# Patient Record
Sex: Female | Born: 1940
Health system: Southern US, Community
[De-identification: ages and names within clinical notes are randomized; demographics above are authoritative.]

## PROBLEM LIST (undated history)

## (undated) DIAGNOSIS — N3941 Urge incontinence: Secondary | ICD-10-CM

## (undated) DIAGNOSIS — E079 Disorder of thyroid, unspecified: Secondary | ICD-10-CM

## (undated) DIAGNOSIS — E039 Hypothyroidism, unspecified: Secondary | ICD-10-CM

## (undated) DIAGNOSIS — Z800 Family history of malignant neoplasm of digestive organs: Secondary | ICD-10-CM

## (undated) DIAGNOSIS — C801 Malignant (primary) neoplasm, unspecified: Secondary | ICD-10-CM

## (undated) DIAGNOSIS — Z8041 Family history of malignant neoplasm of ovary: Secondary | ICD-10-CM

## (undated) DIAGNOSIS — M316 Other giant cell arteritis: Secondary | ICD-10-CM

## (undated) HISTORY — DX: Family history of malignant neoplasm of ovary: Z80.41

## (undated) HISTORY — PX: ABDOMINAL HYSTERECTOMY: SHX81

## (undated) HISTORY — PX: APPENDECTOMY: SHX54

## (undated) HISTORY — DX: Family history of malignant neoplasm of digestive organs: Z80.0

---

## 1999-09-22 ENCOUNTER — Other Ambulatory Visit: Admission: RE | Admit: 1999-09-22 | Discharge: 1999-09-22 | Payer: Self-pay | Admitting: *Deleted

## 2001-02-26 ENCOUNTER — Other Ambulatory Visit: Admission: RE | Admit: 2001-02-26 | Discharge: 2001-02-26 | Payer: Self-pay | Admitting: *Deleted

## 2001-04-09 ENCOUNTER — Ambulatory Visit (HOSPITAL_COMMUNITY): Admission: RE | Admit: 2001-04-09 | Discharge: 2001-04-09 | Payer: Self-pay | Admitting: *Deleted

## 2001-04-17 ENCOUNTER — Encounter: Payer: Self-pay | Admitting: *Deleted

## 2001-04-17 ENCOUNTER — Ambulatory Visit (HOSPITAL_COMMUNITY): Admission: RE | Admit: 2001-04-17 | Discharge: 2001-04-17 | Payer: Self-pay | Admitting: *Deleted

## 2001-06-04 ENCOUNTER — Observation Stay (HOSPITAL_COMMUNITY): Admission: RE | Admit: 2001-06-04 | Discharge: 2001-06-05 | Payer: Self-pay | Admitting: *Deleted

## 2001-10-02 ENCOUNTER — Other Ambulatory Visit: Admission: RE | Admit: 2001-10-02 | Discharge: 2001-10-02 | Payer: Self-pay | Admitting: *Deleted

## 2002-01-31 ENCOUNTER — Other Ambulatory Visit: Admission: RE | Admit: 2002-01-31 | Discharge: 2002-01-31 | Payer: Self-pay | Admitting: *Deleted

## 2002-05-06 ENCOUNTER — Other Ambulatory Visit: Admission: RE | Admit: 2002-05-06 | Discharge: 2002-05-06 | Payer: Self-pay | Admitting: *Deleted

## 2002-09-03 ENCOUNTER — Other Ambulatory Visit: Admission: RE | Admit: 2002-09-03 | Discharge: 2002-09-03 | Payer: Self-pay | Admitting: *Deleted

## 2003-01-03 ENCOUNTER — Other Ambulatory Visit: Admission: RE | Admit: 2003-01-03 | Discharge: 2003-01-03 | Payer: Self-pay | Admitting: *Deleted

## 2003-06-05 ENCOUNTER — Other Ambulatory Visit: Admission: RE | Admit: 2003-06-05 | Discharge: 2003-06-05 | Payer: Self-pay | Admitting: *Deleted

## 2009-04-09 DIAGNOSIS — N3941 Urge incontinence: Secondary | ICD-10-CM | POA: Insufficient documentation

## 2011-07-14 DIAGNOSIS — E7800 Pure hypercholesterolemia, unspecified: Secondary | ICD-10-CM | POA: Insufficient documentation

## 2014-10-23 DIAGNOSIS — N905 Atrophy of vulva: Secondary | ICD-10-CM | POA: Insufficient documentation

## 2015-09-24 DIAGNOSIS — Z6837 Body mass index (BMI) 37.0-37.9, adult: Secondary | ICD-10-CM | POA: Diagnosis not present

## 2015-09-24 DIAGNOSIS — J019 Acute sinusitis, unspecified: Secondary | ICD-10-CM | POA: Diagnosis not present

## 2015-09-24 DIAGNOSIS — H6691 Otitis media, unspecified, right ear: Secondary | ICD-10-CM | POA: Diagnosis not present

## 2015-10-01 ENCOUNTER — Emergency Department (HOSPITAL_COMMUNITY)
Admission: EM | Admit: 2015-10-01 | Discharge: 2015-10-01 | Disposition: A | Discharge status: Home / Self Care | Payer: Medicare Other | Attending: Emergency Medicine | Admitting: Emergency Medicine

## 2015-10-01 ENCOUNTER — Emergency Department (HOSPITAL_COMMUNITY): Payer: Medicare Other

## 2015-10-01 ENCOUNTER — Encounter (HOSPITAL_COMMUNITY): Payer: Self-pay | Admitting: Emergency Medicine

## 2015-10-01 DIAGNOSIS — R51 Headache: Secondary | ICD-10-CM | POA: Diagnosis not present

## 2015-10-01 DIAGNOSIS — R6884 Jaw pain: Secondary | ICD-10-CM | POA: Diagnosis not present

## 2015-10-01 DIAGNOSIS — M316 Other giant cell arteritis: Secondary | ICD-10-CM | POA: Diagnosis not present

## 2015-10-01 DIAGNOSIS — Z7982 Long term (current) use of aspirin: Secondary | ICD-10-CM | POA: Diagnosis not present

## 2015-10-01 HISTORY — DX: Disorder of thyroid, unspecified: E07.9

## 2015-10-01 LAB — CBC WITH DIFFERENTIAL/PLATELET
Basophils Absolute: 0 10*3/uL (ref 0.0–0.1)
Basophils Relative: 0 %
Eosinophils Absolute: 0.1 10*3/uL (ref 0.0–0.7)
Eosinophils Relative: 1 %
HCT: 35.8 % — ABNORMAL LOW (ref 36.0–46.0)
HEMOGLOBIN: 11.5 g/dL — AB (ref 12.0–15.0)
LYMPHS ABS: 1.1 10*3/uL (ref 0.7–4.0)
LYMPHS PCT: 11 %
MCH: 27.8 pg (ref 26.0–34.0)
MCHC: 32.1 g/dL (ref 30.0–36.0)
MCV: 86.7 fL (ref 78.0–100.0)
Monocytes Absolute: 0.9 10*3/uL (ref 0.1–1.0)
Monocytes Relative: 10 %
NEUTROS PCT: 78 %
Neutro Abs: 7.6 10*3/uL (ref 1.7–7.7)
Platelets: 294 10*3/uL (ref 150–400)
RBC: 4.13 MIL/uL (ref 3.87–5.11)
RDW: 14.6 % (ref 11.5–15.5)
WBC: 9.8 10*3/uL (ref 4.0–10.5)

## 2015-10-01 LAB — SEDIMENTATION RATE: Sed Rate: 90 mm/hr — ABNORMAL HIGH (ref 0–22)

## 2015-10-01 LAB — BASIC METABOLIC PANEL
Anion gap: 10 (ref 5–15)
BUN: 13 mg/dL (ref 6–20)
CHLORIDE: 108 mmol/L (ref 101–111)
CO2: 22 mmol/L (ref 22–32)
Calcium: 9.2 mg/dL (ref 8.9–10.3)
Creatinine, Ser: 1.07 mg/dL — ABNORMAL HIGH (ref 0.44–1.00)
GFR calc Af Amer: 59 mL/min — ABNORMAL LOW (ref >60)
GFR calc non Af Amer: 51 mL/min — ABNORMAL LOW (ref >60)
GLUCOSE: 118 mg/dL — AB (ref 65–99)
POTASSIUM: 3.6 mmol/L (ref 3.5–5.1)
Sodium: 140 mmol/L (ref 135–145)

## 2015-10-01 LAB — C-REACTIVE PROTEIN: CRP: 18.6 mg/dL — ABNORMAL HIGH (ref <1.0)

## 2015-10-01 MED ORDER — ASPIRIN 81 MG PO CHEW: 81.0000 mg | CHEWABLE_TABLET | Freq: Every day | ORAL | 0 refills | Status: DC

## 2015-10-01 MED ORDER — BUTALBITAL-APAP-CAFFEINE 50-325-40 MG PO TABS
1.0000 | ORAL_TABLET | Freq: Once | ORAL | Status: AC
Start: 2015-10-01 — End: 2015-10-01
  Administered 2015-10-01: 1 via ORAL
  Filled 2015-10-01: qty 1

## 2015-10-01 MED ORDER — IBUPROFEN 800 MG PO TABS
800.0000 mg | ORAL_TABLET | Freq: Once | ORAL | Status: AC
  Administered 2015-10-01: 800 mg via ORAL
  Filled 2015-10-01: qty 1

## 2015-10-01 MED ORDER — PREDNISONE 20 MG PO TABS: ORAL_TABLET | ORAL | 0 refills | Status: DC

## 2015-10-01 MED ORDER — ACETAMINOPHEN 325 MG PO TABS
650.0000 mg | ORAL_TABLET | Freq: Once | ORAL | Status: AC
  Administered 2015-10-01: 650 mg via ORAL
  Filled 2015-10-01: qty 2

## 2015-10-01 NOTE — ED Notes (Signed)
Pt given a snack per Dr. Tyrone Nine.

## 2015-10-01 NOTE — ED Notes (Deleted)
Attempted to start an IV on pt.

## 2015-10-01 NOTE — ED Triage Notes (Signed)
Pt states one week ago she was having right ear and head pain and seen pcp and was given antibiotics and still no better. Seen dentist Monday and was told she had a malocclusion of her right side of her jaw. Pt still having right side head pain and down into ear and jaw.

## 2015-10-01 NOTE — ED Provider Notes (Signed)
[facility] DEPT Provider Note   CSN: [id number] Arrival date & time: 10/01/15  1126     History   Chief Complaint Chief Complaint  Patient presents with  . Headache  . Otalgia    HPI Kim Krueger is a 75 y.o. female.  75 yo F with a chief complaint of right temporal pain. This been going on for the past couple weeks. She has seen her family physician as well as her dentist for this. She was started on Levaquin for a possible otitis media. She was given a mouth guard for likely TMJ syndrome. She is unable to tolerate the mouthguard because it irritates her gums. She denies fevers or chills denies vomiting or diarrhea. Patient initially denies neurologic deficits but said she had a transient right lower and left lower visual field defect which has completely resolved. She denied any weakness or difficulty with speech. Denies any head injuries. Pain comes and goes this typically dull but has some sharp shooting character to it.   The history is provided by the patient.  Headache   This is a new problem. The current episode started less than 1 hour ago. The problem occurs constantly. The problem has not changed since onset.The headache is associated with nothing. The quality of the pain is described as sharp. The pain is at a severity of 9/10. The pain is moderate. The pain radiates to the face. Pertinent negatives include no fever, no palpitations, no shortness of breath, no nausea and no vomiting. She has tried nothing for the symptoms. The treatment provided no relief.  Otalgia  Pertinent negatives include no headaches, no rhinorrhea and no vomiting.    Past Medical History:  Diagnosis Date  . Thyroid disease     There are no active problems to display for this patient.   History reviewed. No pertinent surgical history.  OB History    No data available       Home Medications    Prior to Admission medications   Medication Sig Start Date End Date Taking?  Authorizing Provider  fluticasone (FLONASE) 50 MCG/ACT nasal spray Place 1 spray into both nostrils daily as needed. 09/24/15  Yes Historical Provider, MD  ibuprofen (ADVIL,MOTRIN) 200 MG tablet Take 600-800 mg by mouth every 4 (four) hours as needed for moderate pain.   Yes Historical Provider, MD  levofloxacin (LEVAQUIN) 500 MG tablet Take 500 mg by mouth daily. 09/24/15  Yes Historical Provider, MD  levothyroxine (SYNTHROID, LEVOTHROID) 88 MCG tablet Take 88 mcg by mouth daily. 09/23/15  Yes Historical Provider, MD  aspirin 81 MG chewable tablet Chew 1 tablet (81 mg total) by mouth daily. 10/01/15 10/31/15  Deno Etienne, DO  predniSONE (DELTASONE) 20 MG tablet 32m a day. 10/01/15 10/15/15  DDeno Etienne DO    Family History No family history on file.  Social History Social History  Substance Use Topics  . Smoking status: Never Smoker  . Smokeless tobacco: Never Used  . Alcohol use No     Allergies   Ceftin [cefuroxime axetil]; Sulfa antibiotics; and Penicillins   Review of Systems Review of Systems  Constitutional: Negative for chills and fever.  HENT: Positive for ear pain. Negative for congestion and rhinorrhea.   Eyes: Negative for redness and visual disturbance.  Respiratory: Negative for shortness of breath and wheezing.   Cardiovascular: Negative for chest pain and palpitations.  Gastrointestinal: Negative for nausea and vomiting.  Genitourinary: Negative for dysuria and urgency.  Musculoskeletal: Negative for arthralgias and myalgias.  Skin: Negative for pallor and wound.  Neurological: Negative for dizziness and headaches.     Physical Exam Updated Vital Signs BP 123/66   Pulse 81   Temp 98.4 F (36.9 C) (Oral)   Resp 16   Ht _0  (1.6 m)   Wt 200 lb (90.7 kg)   SpO2 99%   BMI 35.43 kg/m   Physical Exam  Constitutional: She is oriented to person, place, and time. She appears well-developed and well-nourished. No distress.  HENT:  Head: Normocephalic and  atraumatic.  Tender to palpation about the right temporal artery. Right TM is normal. No pain at the mastoid process. No erythema. No issue with the ear canal. Patient's teeth appear without caries there is no tenderness without palpation or percussion of the teeth. She is tolerating her secretions without difficulty. Able to range her neck. No noted pain with clenching of her teeth.  Eyes: EOM are normal. Pupils are equal, round, and reactive to light.  Neck: Normal range of motion. Neck supple.  Cardiovascular: Normal rate and regular rhythm.  Exam reveals no gallop and no friction rub.   No murmur heard. Pulmonary/Chest: Effort normal. She has no wheezes. She has no rales.  Abdominal: Soft. She exhibits no distension. There is no tenderness.  Musculoskeletal: She exhibits no edema or tenderness.  Neurological: She is alert and oriented to person, place, and time. She has normal strength. No cranial nerve deficit or sensory deficit. She displays a negative Romberg sign. Coordination and gait normal. GCS eye subscore is 4. GCS verbal subscore is 5. GCS motor subscore is 6.  Reflex Scores:      Tricep reflexes are 2+ on the right side and 2+ on the left side.      Bicep reflexes are 2+ on the right side and 2+ on the left side.      Brachioradialis reflexes are 2+ on the right side and 2+ on the left side.      Patellar reflexes are 2+ on the right side and 2+ on the left side.      Achilles reflexes are 2+ on the right side and 2+ on the left side. Skin: Skin is warm and dry. She is not diaphoretic.  Psychiatric: She has a normal mood and affect. Her behavior is normal.  Nursing note and vitals reviewed.    ED Treatments / Results  Labs (all labs ordered are listed, but only abnormal results are displayed) Labs Reviewed  SEDIMENTATION RATE - Abnormal; Notable for the following:       Result Value   Sed Rate 90 (*)    All other components within normal limits  C-REACTIVE PROTEIN -  Abnormal; Notable for the following:    CRP 18.6 (*)    All other components within normal limits  CBC WITH DIFFERENTIAL/PLATELET - Abnormal; Notable for the following:    Hemoglobin 11.5 (*)    HCT 35.8 (*)    All other components within normal limits  BASIC METABOLIC PANEL - Abnormal; Notable for the following:    Glucose, Bld 118 (*)    Creatinine, Ser 1.07 (*)    GFR calc non Af Amer 51 (*)    GFR calc Af Amer 59 (*)    All other components within normal limits    EKG  EKG Interpretation None       Radiology Ct Head Wo Contrast  Result Date: 10/01/2015 CLINICAL DATA:  Right-sided headache for 1 week. EXAM: CT HEAD WITHOUT CONTRAST  TECHNIQUE: Contiguous axial images were obtained from the base of the skull through the vertex without intravenous contrast. COMPARISON:  10/01/2015 FINDINGS: Brain: Stable age related cerebral atrophy, ventriculomegaly and periventricular white matter disease. No extra-axial fluid collections are identified. No CT findings for acute hemispheric infarction or intracranial hemorrhage. No mass lesions. The brainstem and cerebellum are normal. Vascular: Stable minimal vascular calcifications.  No dense vessels. Skull: No bone lesion or fracture. Sinuses/Orbits: The sinuses are clear.  The globes are intact. Other: No scalp lesion. IMPRESSION: Stable age related cerebral atrophy, ventriculomegaly and periventricular white matter disease. No acute intracranial findings, mass lesion or skull fracture. Electronically Signed   By: Marijo Sanes M.D.   On: 10/01/2015 13:35   Ct Maxillofacial Wo Contrast  Result Date: 10/01/2015 CLINICAL DATA:  Right jaw pain and right-sided headache for 1 week EXAM: CT MAXILLOFACIAL WITHOUT CONTRAST TECHNIQUE: Multidetector CT imaging of the maxillofacial structures was performed. Multiplanar CT image reconstructions were also generated. A small metallic BB was placed on the right temple in order to reliably differentiate right from  left. COMPARISON:  None. FINDINGS: Osseous: No dental abnormality is seen. The orbital rims appear intact. The zygomatic arches are intact. The temporomandibular condyles are in normal position. Orbits: The orbital rims appear intact. No periorbital air or abnormal soft tissue is seen. Sinuses: The paranasal sinuses are clear. The nasal turbinates are normal in position and the nasal airway is patent. Soft tissues: No soft tissue abnormality is seen. Limited intracranial: No intracranial abnormality seen on the limited views obtained. IMPRESSION: Negative CT maxillofacial. The sinuses are clear and no maxillofacial bony abnormality is seen. Electronically Signed   By: Ivar Drape M.D.   On: 10/01/2015 13:46    Procedures Procedures (including critical care time)  Medications Ordered in ED Medications  acetaminophen (TYLENOL) tablet 650 mg (650 mg Oral Given 10/01/15 1302)  butalbital-acetaminophen-caffeine (FIORICET, ESGIC) 50-325-40 MG per tablet 1 tablet (1 tablet Oral Given 10/01/15 1303)  ibuprofen (ADVIL,MOTRIN) tablet 800 mg (800 mg Oral Given 10/01/15 1303)     Initial Impression / Assessment and Plan / ED Course  I have reviewed the triage vital signs and the nursing notes.  Pertinent labs & imaging results that were available during my care of the patient were reviewed by me and considered in my medical decision making (see chart for details).  Clinical Course    75 yo M With a chief complaint of right temporal pain. Patient has no symptoms of polymyalgia rheumatica however I will obtain an ESR and CRP for possible temporal arteritis. I suspect most likely TMJ syndrome based on history however patient symptoms are not reproducible with clenching of her teeth.   Elevated inflammatory markers, discussed with Dr. Leonel Ramsay, neuro, recommended 71m prednisone, neuro follow up.   6:22 PM:  I have discussed the diagnosis/risks/treatment options with the patient and family and believe the  pt to be eligible for discharge home to follow-up with Neurology. We also discussed returning to the ED immediately if new or worsening sx occur. We discussed the sx which are most concerning (e.g., visual changes) that necessitate immediate return. Medications administered to the patient during their visit and any new prescriptions provided to the patient are listed below.  Medications given during this visit Medications  acetaminophen (TYLENOL) tablet 650 mg (650 mg Oral Given 10/01/15 1302)  butalbital-acetaminophen-caffeine (FIORICET, ESGIC) 50-325-40 MG per tablet 1 tablet (1 tablet Oral Given 10/01/15 1303)  ibuprofen (ADVIL,MOTRIN) tablet 800 mg (800 mg Oral  Given 10/01/15 1303)     The patient appears reasonably screen and/or stabilized for discharge and I doubt any other medical condition or other [facility] requiring further screening, evaluation, or treatment in the ED at this time prior to discharge.    Final Clinical Impressions(s) / ED Diagnoses   Final diagnoses:  Temporal arteritis ([place])    New Prescriptions Discharge Medication List as of 10/01/2015  3:05 PM    START taking these medications   Details  aspirin 81 MG chewable tablet Chew 1 tablet (81 mg total) by mouth daily., Starting Thu 10/01/2015, Until Sat 10/31/2015, Print    predniSONE (DELTASONE) 20 MG tablet 34m a day., PKings[place] DO 10/01/15 1823

## 2015-10-05 ENCOUNTER — Ambulatory Visit (INDEPENDENT_AMBULATORY_CARE_PROVIDER_SITE_OTHER): Payer: Medicare Other | Admitting: Neurology

## 2015-10-05 ENCOUNTER — Encounter: Payer: Self-pay | Admitting: Neurology

## 2015-10-05 VITALS — BP 146/82 | HR 79 | Temp 96.0°F | Wt 200.4 lb

## 2015-10-05 DIAGNOSIS — M316 Other giant cell arteritis: Secondary | ICD-10-CM

## 2015-10-05 MED ORDER — TRAMADOL HCL 50 MG PO TABS: ORAL_TABLET | ORAL | 5 refills | Status: DC

## 2015-10-05 MED ORDER — PREDNISONE 20 MG PO TABS: ORAL_TABLET | ORAL | 0 refills | Status: AC

## 2015-10-05 NOTE — Progress Notes (Signed)
NEUROLOGY CONSULTATION NOTE  Kim Krueger MRN: [id number] DOB: 1940/04/26  Referring provider: Dr. Deno Etienne Primary care provider: Dr. Domenick Gong  Reason for consult:  Temporal arteritis  Dear Dr Tyrone Nine:  Thank you for your kind referral of Kim Krueger for consultation of the above symptoms. Although her history is well known to you, please allow me to reiterate it for the purpose of our medical record. The patient was accompanied to the clinic by her friend who also provides collateral information. Records and images were personally reviewed where available.  HISTORY OF PRESENT ILLNESS: This is a 75 year old right-handed woman with a history of hypothyroidism, presenting for evaluation of new onset headaches. She started having pain around her right ear and localized pain on the right temporal region around 2 weeks ago. She describes the pain as a soreness and ache radiating down her right cheek. She went to her PCP office and was told ear exam was normal, given Levofloxacin and a nasal spray. She saw her dentist a few days after and was told her bite was off and measured her for a mouth guard which she got the same day. She wore this for 2 nights, then woke up Wednesday morning with the right inside of her cheek feeling mutilated from the mouth guard. She was in a lot of pain and taking 3 tablets of Motrin every 4 hours. That evening, she saw 2 black rectangles blocking the lower part of her vision. She went to bed and was woken up by shooting pain on the right side, no further vision symptoms. She went to the ER on 10/01/15 due to continued pain, reporting taking 28 Motrin over a 24-hour period. She was given a dose of Fioricet which helped for a time. ER records were reviewed, ESR was elevated at 90, CRP elevated at 18.6. CBC and BMP were unremarkable except for mildly elevated creatinine of 1.07. I personally reviewed head CT without contrast which did not show any acute changes,  there was age-related atrophy and chronic microvascular disease. She was discharged home on Prednisone '80mg'$  daily and instructions to take Motrin every 4 hours. Pain would go down to a 0/10, but after 4 hours, go back up to 8/10. With starting prednisone, she can go 6-8 hours without taking Motrin. Tylenol did not help. She has lost 10 lbs since pain started due to mouth issues, she feels like there are ulcers in her mouth and only takes soft foods and yoghurt. She denies any ear pain but feels like there is draining inside her right ear, no hearing loss or tinnitus. She denies any conjunctival injection or tearing, no focal numbness/tingling/weakness. No neck/back pain. She denies any prior similar symptoms, no prior history of headaches.   Laboratory Data: Lab Results  Component Value Date   ESRSEDRATE 90 (H) 10/01/2015   Lab Results  Component Value Date   CRP 18.6 (H) 10/01/2015     PAST MEDICAL HISTORY: Past Medical History:  Diagnosis Date  . Thyroid disease     PAST SURGICAL HISTORY: No past surgical history on file.  MEDICATIONS: Current Outpatient Prescriptions on File Prior to Visit  Medication Sig Dispense Refill  . ibuprofen (ADVIL,MOTRIN) 200 MG tablet Take 600-800 mg by mouth every 4 (four) hours as needed for moderate pain.    Marland Kitchen levothyroxine (SYNTHROID, LEVOTHROID) 88 MCG tablet Take 88 mcg by mouth daily.  1  . predniSONE (DELTASONE) 20 MG tablet '80mg'$  a day. 120 tablet 0  .  aspirin 81 MG chewable tablet Chew 1 tablet (81 mg total) by mouth daily. (Patient not taking: Reported on 10/05/2015) 30 tablet 0  . fluticasone (FLONASE) 50 MCG/ACT nasal spray Place 1 spray into both nostrils daily as needed.  0  . levofloxacin (LEVAQUIN) 500 MG tablet Take 500 mg by mouth daily.  0   No current facility-administered medications on file prior to visit.     ALLERGIES: Allergies  Allergen Reactions  . Ceftin [Cefuroxime Axetil]   . Sulfa Antibiotics   . Penicillins Rash     Has patient had a PCN reaction causing immediate rash, facial/tongue/throat swelling, SOB or lightheadedness with hypotension: Yes Has patient had a PCN reaction causing severe rash involving mucus membranes or skin necrosis: No Has patient had a PCN reaction that required hospitalization No Has patient had a PCN reaction occurring within the last 10 years: No If all of the above answers are "NO", then may proceed with Cephalosporin use.     FAMILY HISTORY: No family history on file.  SOCIAL HISTORY: Social History   Social History  . Marital status: Single    Spouse name: N/A  . Number of children: N/A  . Years of education: N/A   Occupational History  . Not on file.   Social History Main Topics  . Smoking status: Never Smoker  . Smokeless tobacco: Never Used  . Alcohol use No  . Drug use: Unknown  . Sexual activity: Not on file   Other Topics Concern  . Not on file   Social History Narrative  . No narrative on file    REVIEW OF SYSTEMS: Constitutional: No fevers, chills, or sweats, no generalized fatigue, change in appetite Eyes: No visual changes, double vision, eye pain Ear, nose and throat: No hearing loss, ear pain, nasal congestion, sore throat Cardiovascular: No chest pain, palpitations Respiratory:  No shortness of breath at rest or with exertion, wheezes GastrointestinaI: No nausea, vomiting, diarrhea, abdominal pain, fecal incontinence Genitourinary:  No dysuria, urinary retention or frequency Musculoskeletal:  No neck pain, back pain, occasional knee pain Integumentary: No rash, pruritus, skin lesions Neurological: as above Psychiatric: No depression, insomnia, anxiety Endocrine: No palpitations, fatigue, diaphoresis, mood swings, change in appetite, change in weight, increased thirst Hematologic/Lymphatic:  No anemia, purpura, petechiae. Allergic/Immunologic: no itchy/runny eyes, nasal congestion, recent allergic reactions, rashes  PHYSICAL  EXAM: Vitals:   10/05/15 1519  BP: (!) 146/82  Pulse: 79  Temp: (!) 96 F (35.6 C)   General: No acute distress Head:  Normocephalic/atraumatic, no temporal artery tenderness or ropiness. No vesicles on ear exam, no ulcers in mouth Eyes: Fundoscopic exam shows bilateral sharp discs, no vessel changes, exudates, or hemorrhages Neck: supple, no paraspinal tenderness, full range of motion Back: No paraspinal tenderness Heart: regular rate and rhythm Lungs: Clear to auscultation bilaterally. Vascular: No carotid bruits. Skin/Extremities: No rash, no edema Neurological Exam: Mental status: alert and oriented to person, place, and time, no dysarthria or aphasia, Fund of knowledge is appropriate.  Recent and remote memory are intact.  Attention and concentration are normal.    Able to name objects and repeat phrases. Cranial nerves: CN I: not tested CN II: pupils equal, round and reactive to light, visual fields intact, fundi unremarkable. CN III, IV, VI:  full range of motion, no nystagmus, no ptosis CN V: facial sensation intact CN VII: upper and lower face symmetric CN VIII: hearing intact to finger rub CN IX, X: gag intact, uvula midline CN XI: sternocleidomastoid  and trapezius muscles intact CN XII: tongue midline Bulk & Tone: normal, no fasciculations. Motor: 5/5 throughout with no pronator drift. Sensation: intact to light touch, cold, pin, vibration and joint position sense.  No extinction to double simultaneous stimulation.  Romberg test negative Deep Tendon Reflexes: +2 throughout, no ankle clonus Plantar responses: downgoing bilaterally Cerebellar: no incoordination on finger to nose, heel to shin. No dysdiadochokinesia Gait: narrow-based and steady, able to tandem walk adequately. Tremor: none  IMPRESSION: This is a 75 year old right-handed woman with a history of hypothyroidism, presenting with new onset right temporal pain also affecting her right ear and right jaw  region. She does not clearly describe jaw claudication, but feels the inner cheek is different. She is concerned this is due to the mouth guard, however I do not see any ulcerations. No temporal tenderness since starting high dose Prednisone. Her ESR and CRP were elevated, raising concern for temporal arteritis with new onset headache in this age group. The ear symptoms are atypical, glossopharyngeal neuralgia is considered, however would not cause the elevated inflammatory markers, continue to monitor. She will be referred for temporal artery biopsy. Recommend continuation of Prednisone taper by '10mg'$  every 2 weeks. Side effects of prednisone were discussed. She was advised to stop motrin intake, and was given a prescription for Tramadol instead, side effects were discussed. She will follow-up in 2 months and knows to call for any changes.   Thank you for allowing me to participate in the care of this patient. Please do not hesitate to call for any questions or concerns.   Ellouise Newer, M.D.  CC: Dr. Osborne Casco

## 2015-10-05 NOTE — Patient Instructions (Signed)
1. Refer for temporal artery biopsy evaluation  2. Take Tramadol 50mg  1/2 tablet every 12 hours as needed for pain. If needed, you can take another 1/2 tablet.  3. Continue Prednisone 20mg  tablets daily, you will be tapering it every 2 weeks: Week 1-2: Take 4 tablets daily Week 3-4: Take 3.5 tablets daily Week 5-6: Take 3 tablets daily Week 7-8: Take 2.5 tablets daily Week 9-10: Take 2 tablets daily Week 11-12: Take 1.5 tablets daily Week 13-14: Take 1 tablet daily Week 15-16: Take 1/2 tablet daily  4. Follow-up in 2 months, call for any changes

## 2015-10-06 ENCOUNTER — Other Ambulatory Visit: Payer: Self-pay | Admitting: *Deleted

## 2015-10-06 DIAGNOSIS — M316 Other giant cell arteritis: Secondary | ICD-10-CM

## 2015-10-07 ENCOUNTER — Telehealth: Payer: Self-pay | Admitting: Neurology

## 2015-10-07 NOTE — Telephone Encounter (Signed)
Notified pt per Dr. Delice Lesch pt may take  Tramadol 1 Tablet BID and then an additional 1/2 tablet if needed.

## 2015-10-07 NOTE — Telephone Encounter (Signed)
PT called and has a question about her medication/Dawn CB# [phone]

## 2015-10-09 ENCOUNTER — Telehealth: Payer: Self-pay | Admitting: Neurology

## 2015-10-09 ENCOUNTER — Other Ambulatory Visit: Payer: Self-pay

## 2015-10-09 MED ORDER — GABAPENTIN 100 MG PO CAPS: 100.0000 mg | ORAL_CAPSULE | Freq: Every day | ORAL | 5 refills | Status: DC

## 2015-10-09 NOTE — Telephone Encounter (Signed)
Notified pt, pt verbalized understanding. Rx sent to pharmacy. Pt see's surgeon on 10/09/15.

## 2015-10-09 NOTE — Telephone Encounter (Signed)
Pt states she took prescribed dose of Tramadol on 10/07/15 and it felt like it was making her headaches worse. Pt took Prednisone on 10/08/15 but did not take any Tramadol because she does not feel it is working. She states still having headache pain and wants to know if there is anything else she can try.

## 2015-10-09 NOTE — Telephone Encounter (Signed)
She should continue taking the Prednisone as prescribed regularly. Stop the Tramadol. Start Gabapentin 100mg  at bedtime for the first week, then if not drowsy, increase to 100mg  BID. Pls let her know main side effect is also drowsiness, so hold off on driving again until she knows how she is reacting to medication. When is she seeing the surgeon? Thanks

## 2015-10-09 NOTE — Telephone Encounter (Signed)
Kim Krueger 12-04-2040 Her # [phone] and Cell # [phone]. Having headache pain all over her head. She saw Dr. Delice Lesch earlier in the week.  She would like you to please call her. Thank you

## 2015-10-13 ENCOUNTER — Encounter (HOSPITAL_BASED_OUTPATIENT_CLINIC_OR_DEPARTMENT_OTHER)
Admission: RE | Admit: 2015-10-13 | Discharge: 2015-10-13 | Disposition: A | Discharge status: Home / Self Care | Payer: Medicare Other | Source: Ambulatory Visit | Attending: General Surgery | Admitting: General Surgery

## 2015-10-13 ENCOUNTER — Ambulatory Visit
Admission: RE | Admit: 2015-10-13 | Discharge: 2015-10-13 | Disposition: A | Discharge status: Home / Self Care | Payer: Medicare Other | Source: Ambulatory Visit | Attending: General Surgery | Admitting: General Surgery

## 2015-10-13 ENCOUNTER — Ambulatory Visit: Payer: Self-pay | Admitting: General Surgery

## 2015-10-13 ENCOUNTER — Other Ambulatory Visit: Payer: Self-pay | Admitting: General Surgery

## 2015-10-13 ENCOUNTER — Encounter (HOSPITAL_BASED_OUTPATIENT_CLINIC_OR_DEPARTMENT_OTHER): Payer: Self-pay | Admitting: *Deleted

## 2015-10-13 DIAGNOSIS — Z85820 Personal history of malignant melanoma of skin: Secondary | ICD-10-CM | POA: Diagnosis not present

## 2015-10-13 DIAGNOSIS — Z01818 Encounter for other preprocedural examination: Secondary | ICD-10-CM

## 2015-10-13 DIAGNOSIS — M316 Other giant cell arteritis: Secondary | ICD-10-CM | POA: Diagnosis not present

## 2015-10-13 DIAGNOSIS — M47814 Spondylosis without myelopathy or radiculopathy, thoracic region: Secondary | ICD-10-CM | POA: Diagnosis not present

## 2015-10-13 DIAGNOSIS — H2513 Age-related nuclear cataract, bilateral: Secondary | ICD-10-CM | POA: Diagnosis not present

## 2015-10-13 DIAGNOSIS — Z7952 Long term (current) use of systemic steroids: Secondary | ICD-10-CM | POA: Diagnosis not present

## 2015-10-13 DIAGNOSIS — Z7982 Long term (current) use of aspirin: Secondary | ICD-10-CM | POA: Diagnosis not present

## 2015-10-13 DIAGNOSIS — E039 Hypothyroidism, unspecified: Secondary | ICD-10-CM | POA: Diagnosis not present

## 2015-10-13 DIAGNOSIS — Z79899 Other long term (current) drug therapy: Secondary | ICD-10-CM | POA: Diagnosis not present

## 2015-10-19 ENCOUNTER — Encounter (HOSPITAL_BASED_OUTPATIENT_CLINIC_OR_DEPARTMENT_OTHER)
Admission: RE | Disposition: A | Discharge status: Home / Self Care | Payer: Self-pay | Source: Ambulatory Visit | Attending: General Surgery

## 2015-10-19 ENCOUNTER — Ambulatory Visit (HOSPITAL_BASED_OUTPATIENT_CLINIC_OR_DEPARTMENT_OTHER): Payer: Medicare Other | Admitting: Anesthesiology

## 2015-10-19 ENCOUNTER — Ambulatory Visit (HOSPITAL_COMMUNITY): Payer: Medicare Other

## 2015-10-19 ENCOUNTER — Encounter (HOSPITAL_BASED_OUTPATIENT_CLINIC_OR_DEPARTMENT_OTHER): Payer: Self-pay | Admitting: *Deleted

## 2015-10-19 ENCOUNTER — Ambulatory Visit (HOSPITAL_BASED_OUTPATIENT_CLINIC_OR_DEPARTMENT_OTHER)
Admission: RE | Admit: 2015-10-19 | Discharge: 2015-10-19 | Disposition: A | Discharge status: Home / Self Care | Payer: Medicare Other | Source: Ambulatory Visit | Attending: General Surgery | Admitting: General Surgery

## 2015-10-19 DIAGNOSIS — Z7982 Long term (current) use of aspirin: Secondary | ICD-10-CM | POA: Diagnosis not present

## 2015-10-19 DIAGNOSIS — M316 Other giant cell arteritis: Secondary | ICD-10-CM | POA: Diagnosis not present

## 2015-10-19 DIAGNOSIS — Z79899 Other long term (current) drug therapy: Secondary | ICD-10-CM | POA: Diagnosis not present

## 2015-10-19 DIAGNOSIS — I668 Occlusion and stenosis of other cerebral arteries: Secondary | ICD-10-CM | POA: Diagnosis not present

## 2015-10-19 DIAGNOSIS — Z85820 Personal history of malignant melanoma of skin: Secondary | ICD-10-CM | POA: Diagnosis not present

## 2015-10-19 DIAGNOSIS — E039 Hypothyroidism, unspecified: Secondary | ICD-10-CM | POA: Diagnosis not present

## 2015-10-19 DIAGNOSIS — Z7952 Long term (current) use of systemic steroids: Secondary | ICD-10-CM | POA: Diagnosis not present

## 2015-10-19 DIAGNOSIS — Z01818 Encounter for other preprocedural examination: Secondary | ICD-10-CM

## 2015-10-19 HISTORY — DX: Hypothyroidism, unspecified: E03.9

## 2015-10-19 HISTORY — PX: ARTERY BIOPSY: SHX891

## 2015-10-19 HISTORY — DX: Other giant cell arteritis: M31.6

## 2015-10-19 SURGERY — BIOPSY TEMPORAL ARTERY
Anesthesia: General | Site: Head | Laterality: Right

## 2015-10-19 MED ORDER — LIDOCAINE HCL (PF) 1 % IJ SOLN
INTRAMUSCULAR | Status: AC
  Filled 2015-10-19: qty 30

## 2015-10-19 MED ORDER — CIPROFLOXACIN IN D5W 400 MG/200ML IV SOLN
400.0000 mg | INTRAVENOUS | Status: AC
  Administered 2015-10-19: 400 mg via INTRAVENOUS

## 2015-10-19 MED ORDER — LIDOCAINE 2% (20 MG/ML) 5 ML SYRINGE
INTRAMUSCULAR | Status: AC
  Filled 2015-10-19: qty 5

## 2015-10-19 MED ORDER — PROPOFOL 10 MG/ML IV BOLUS
INTRAVENOUS | Status: DC | PRN
  Administered 2015-10-19: 150 mg via INTRAVENOUS
  Administered 2015-10-19: 20 mg via INTRAVENOUS

## 2015-10-19 MED ORDER — BUPIVACAINE-EPINEPHRINE (PF) 0.5% -1:200000 IJ SOLN
INTRAMUSCULAR | Status: AC
  Filled 2015-10-19: qty 30

## 2015-10-19 MED ORDER — FENTANYL CITRATE (PF) 100 MCG/2ML IJ SOLN
INTRAMUSCULAR | Status: AC
  Filled 2015-10-19: qty 2

## 2015-10-19 MED ORDER — LIDOCAINE-EPINEPHRINE (PF) 1 %-1:200000 IJ SOLN
INTRAMUSCULAR | Status: AC
  Filled 2015-10-19: qty 30

## 2015-10-19 MED ORDER — FENTANYL CITRATE (PF) 100 MCG/2ML IJ SOLN
25.0000 ug | INTRAMUSCULAR | Status: DC | PRN
  Administered 2015-10-19: 25 ug via INTRAVENOUS
  Administered 2015-10-19: 50 ug via INTRAVENOUS
  Administered 2015-10-19: 25 ug via INTRAVENOUS

## 2015-10-19 MED ORDER — FENTANYL CITRATE (PF) 100 MCG/2ML IJ SOLN
50.0000 ug | INTRAMUSCULAR | Status: DC | PRN
  Administered 2015-10-19: 100 ug via INTRAVENOUS
  Administered 2015-10-19: 25 ug via INTRAVENOUS

## 2015-10-19 MED ORDER — GLYCOPYRROLATE 0.2 MG/ML IV SOSY
PREFILLED_SYRINGE | INTRAVENOUS | Status: AC
  Filled 2015-10-19: qty 3

## 2015-10-19 MED ORDER — DEXAMETHASONE SODIUM PHOSPHATE 4 MG/ML IJ SOLN
INTRAMUSCULAR | Status: DC | PRN
  Administered 2015-10-19: 10 mg via INTRAVENOUS

## 2015-10-19 MED ORDER — EPHEDRINE SULFATE 50 MG/ML IJ SOLN
INTRAMUSCULAR | Status: DC | PRN
  Administered 2015-10-19: 10 mg via INTRAVENOUS
  Administered 2015-10-19: 5 mg via INTRAVENOUS

## 2015-10-19 MED ORDER — TRAMADOL HCL 50 MG PO TABS: 50.0000 mg | ORAL_TABLET | Freq: Four times a day (QID) | ORAL | 0 refills | Status: DC | PRN

## 2015-10-19 MED ORDER — PROPOFOL 500 MG/50ML IV EMUL
INTRAVENOUS | Status: AC
  Filled 2015-10-19: qty 50

## 2015-10-19 MED ORDER — MIDAZOLAM HCL 2 MG/2ML IJ SOLN: 1.0000 mg | INTRAMUSCULAR | Status: DC | PRN

## 2015-10-19 MED ORDER — LIDOCAINE HCL (PF) 1 % IJ SOLN
INTRAMUSCULAR | Status: DC | PRN
  Administered 2015-10-19: 7 mL

## 2015-10-19 MED ORDER — LIDOCAINE-EPINEPHRINE 1 %-1:100000 IJ SOLN
INTRAMUSCULAR | Status: AC
Start: 2015-10-19 — End: 2015-10-19
  Filled 2015-10-19: qty 1

## 2015-10-19 MED ORDER — CHLORHEXIDINE GLUCONATE CLOTH 2 % EX PADS: 6.0000 | MEDICATED_PAD | Freq: Once | CUTANEOUS | Status: DC

## 2015-10-19 MED ORDER — LIDOCAINE HCL (CARDIAC) 20 MG/ML IV SOLN
INTRAVENOUS | Status: DC | PRN
  Administered 2015-10-19: 50 mg via INTRAVENOUS

## 2015-10-19 MED ORDER — ONDANSETRON HCL 4 MG/2ML IJ SOLN
INTRAMUSCULAR | Status: DC | PRN
  Administered 2015-10-19: 4 mg via INTRAVENOUS

## 2015-10-19 MED ORDER — BUPIVACAINE-EPINEPHRINE (PF) 0.25% -1:200000 IJ SOLN
INTRAMUSCULAR | Status: AC
  Filled 2015-10-19: qty 30

## 2015-10-19 MED ORDER — PROMETHAZINE HCL 25 MG/ML IJ SOLN: 6.2500 mg | INTRAMUSCULAR | Status: DC | PRN

## 2015-10-19 MED ORDER — CIPROFLOXACIN IN D5W 400 MG/200ML IV SOLN
INTRAVENOUS | Status: AC
  Filled 2015-10-19: qty 200

## 2015-10-19 MED ORDER — SCOPOLAMINE 1 MG/3DAYS TD PT72
MEDICATED_PATCH | TRANSDERMAL | Status: AC
  Filled 2015-10-19: qty 1

## 2015-10-19 MED ORDER — SCOPOLAMINE 1 MG/3DAYS TD PT72
1.0000 | MEDICATED_PATCH | Freq: Once | TRANSDERMAL | Status: AC | PRN
  Administered 2015-10-19: 1 via TRANSDERMAL

## 2015-10-19 MED ORDER — GLYCOPYRROLATE 0.2 MG/ML IJ SOLN: 0.2000 mg | Freq: Once | INTRAMUSCULAR | Status: DC | PRN

## 2015-10-19 MED ORDER — ONDANSETRON HCL 4 MG/2ML IJ SOLN
INTRAMUSCULAR | Status: AC
  Filled 2015-10-19: qty 2

## 2015-10-19 MED ORDER — LACTATED RINGERS IV SOLN
INTRAVENOUS | Status: DC
  Administered 2015-10-19: 10:00:00 via INTRAVENOUS

## 2015-10-19 MED ORDER — EPHEDRINE 5 MG/ML INJ
INTRAVENOUS | Status: AC
  Filled 2015-10-19: qty 10

## 2015-10-19 MED ORDER — DEXAMETHASONE SODIUM PHOSPHATE 10 MG/ML IJ SOLN
INTRAMUSCULAR | Status: AC
  Filled 2015-10-19: qty 1

## 2015-10-19 SURGICAL SUPPLY — 40 items
ADH SKN CLS APL DERMABOND .7 (GAUZE/BANDAGES/DRESSINGS) ×1
BLADE CLIPPER SURG (BLADE) ×2 IMPLANT
BLADE SURG 15 STRL LF DISP TIS (BLADE) ×1 IMPLANT
BLADE SURG 15 STRL SS (BLADE) ×2
CANISTER SUCT 1200ML W/VALVE (MISCELLANEOUS) IMPLANT
CLEANER CAUTERY TIP 5X5 PAD (MISCELLANEOUS) ×1 IMPLANT
COVER BACK TABLE 60X90IN (DRAPES) ×2 IMPLANT
COVER MAYO STAND STRL (DRAPES) ×2 IMPLANT
DECANTER SPIKE VIAL GLASS SM (MISCELLANEOUS) ×2 IMPLANT
DERMABOND ADVANCED (GAUZE/BANDAGES/DRESSINGS) ×1
DERMABOND ADVANCED .7 DNX12 (GAUZE/BANDAGES/DRESSINGS) ×1 IMPLANT
DRAPE LAPAROTOMY 100X72 PEDS (DRAPES) ×2 IMPLANT
DRAPE UTILITY XL STRL (DRAPES) ×2 IMPLANT
DRSG TEGADERM 2-3/8X2-3/4 SM (GAUZE/BANDAGES/DRESSINGS) IMPLANT
ELECT NDL BLADE 2-5/6 (NEEDLE) ×1 IMPLANT
ELECT NEEDLE BLADE 2-5/6 (NEEDLE) ×2 IMPLANT
ELECT REM PT RETURN 9FT ADLT (ELECTROSURGICAL) ×2
ELECTRODE REM PT RTRN 9FT ADLT (ELECTROSURGICAL) ×1 IMPLANT
GAUZE SPONGE 4X4 16PLY XRAY LF (GAUZE/BANDAGES/DRESSINGS) IMPLANT
GLOVE BIOGEL PI IND STRL 8 (GLOVE) ×1 IMPLANT
GLOVE BIOGEL PI INDICATOR 8 (GLOVE) ×1
GLOVE ECLIPSE 7.5 STRL STRAW (GLOVE) ×2 IMPLANT
GOWN STRL REUS W/ TWL LRG LVL3 (GOWN DISPOSABLE) ×1 IMPLANT
GOWN STRL REUS W/TWL LRG LVL3 (GOWN DISPOSABLE) ×2
NDL PRECISIONGLIDE 27X1.5 (NEEDLE) ×1 IMPLANT
NEEDLE PRECISIONGLIDE 27X1.5 (NEEDLE) ×2 IMPLANT
PACK BASIN DAY SURGERY FS (CUSTOM PROCEDURE TRAY) ×2 IMPLANT
PAD CLEANER CAUTERY TIP 5X5 (MISCELLANEOUS) ×1
PENCIL BUTTON HOLSTER BLD 10FT (ELECTRODE) ×2 IMPLANT
SLEEVE SCD COMPRESS KNEE MED (MISCELLANEOUS) ×1 IMPLANT
STRIP CLOSURE SKIN 1/4X4 (GAUZE/BANDAGES/DRESSINGS) ×2 IMPLANT
SUT VIC AB 4-0 SH 27 (SUTURE)
SUT VIC AB 4-0 SH 27XANBCTRL (SUTURE) IMPLANT
SUT VIC AB 5-0 P-3 18X BRD (SUTURE) ×1 IMPLANT
SUT VIC AB 5-0 P3 18 (SUTURE) ×2
SUT VICRYL AB 3 0 TIES (SUTURE) ×2 IMPLANT
SYR CONTROL 10ML LL (SYRINGE) ×2 IMPLANT
TOWEL OR 17X24 6PK STRL BLUE (TOWEL DISPOSABLE) ×4 IMPLANT
TUBE CONNECTING 20X1/4 (TUBING) IMPLANT
YANKAUER SUCT BULB TIP NO VENT (SUCTIONS) IMPLANT

## 2015-10-19 NOTE — Discharge Instructions (Addendum)
Temporal Arteritis Temporal arteritis, also called giant cell arteritis, is a condition that causes arteries to become swollen (inflamed). It usually affects arteries in your head and face, but arteries in any part of the body can become inflamed. Temporal arteritis can cause serious problems, such as bone loss, diabetes, and blindness. CAUSES  The cause is unknown. RISK FACTORS  Being older than 48.  Being a woman.  Being Caucasian.  Being of [place], [place], [place], [place] or [place] ancestry.  Having polymyalgia rheumatica (PMR). SIGNS AND SYMPTOMS  Some people with temporal arteritis have just one symptom, while other have several symptoms. Most signs and symptoms are related to the head and face. Signs and symptoms may include:  Hard or swollen temples (common). Your temples are the flattened area on either side of your forehead. If your temples are swollen, it may hurt to touch them.  Pain when combing your hair or when laying your head down.  Pain in the jaw when chewing.  Pain in the throat or tongue.  Problems with your vision, such as sudden loss of vision in one eye, or seeing double.  Fever.  Fatigue.  A dry cough.  Pain in the hips and shoulders.  Pain in the arms during exercise.  Depression.  Weight loss. DIAGNOSIS  Your health care provider will ask about your symptoms and do a physical exam. He or she may also perform an eye exam and tests, such as:  A complete blood count.  An erythrocyte sedimentation rate test, also called the sed rate test.  A C-reactive protein (CRP) test.  A tissue sample (biopsy) test. TREATMENT  Temporal arteritis is treated with a type of medicine called a corticosteroid. Vision problems may be treated with additional medicines. You will need to see your health care provider while you are being treated. During follow-up visits, your health care provider will check for problems by:  Performing blood tests and bone  density tests.  Checking your blood pressure and blood sugar. HOME CARE INSTRUCTIONS   Take medicines only as directed by your health care provider.  Take any vitamins or supplements that your health care provider suggests. These may include vitamin D and calcium, which help keep your bones from becoming weak.  Exercise. Talk with your health care provider about what exercises are okay for you to do. Usually exercises that increase your heart rate (aerobic exercise), such as walking, are recommended. Aerobic exercise helps control your blood pressure and prevent bone loss.  Follow a healthy diet. Include healthy sources of protein, fruits, vegetables, and whole grains in your diet. Following a healthy diet helps prevent bone damage and diabetes.  Postoperatively, leave dressing in place and may shower tomorrow.  Sit upright for the next 4 hours at home.  No aspirin for the next 24 hours. SEEK MEDICAL CARE IF:   Your symptoms get worse.  Your fever, fatigue, headache, weight loss, or pain in your jaw gets worse.  You develop signs of infection, such as fever, swelling, redness, warmth, and tenderness. SEEK IMMEDIATE MEDICAL CARE IF:   Your vision gets worse.  Your pain does not go away, even after you take pain medicine.  You have chest pain.  You have trouble breathing.  One side of your face or body suddenly becomes weak or numb. MAKE SURE YOU:  Understand these instructions.  Will watch your condition.  Will get help right away if you are not doing well or get worse.   This information is  not intended to replace advice given to you by your health care provider. Make sure you discuss any questions you have with your health care provider.   Document Released: 10/31/2008 Document Revised: 01/24/2014 Document Reviewed: 02/27/2013 Elsevier Interactive Patient Education 2016 [facility] Care Instructions  Activity: Get plenty of rest for the  remainder of the day. A responsible adult should stay with you for 24 hours following the procedure.  For the next 24 hours, DO NOT: -Drive a car -Paediatric nurse -Drink alcoholic beverages -Take any medication unless instructed by your physician -Make any legal decisions or sign important papers.  Meals: Start with liquid foods such as gelatin or soup. Progress to regular foods as tolerated. Avoid greasy, spicy, heavy foods. If nausea and/or vomiting occur, drink only clear liquids until the nausea and/or vomiting subsides. Call your physician if vomiting continues.  Special Instructions/Symptoms: Your throat may feel dry or sore from the anesthesia or the breathing tube placed in your throat during surgery. If this causes discomfort, gargle with warm salt water. The discomfort should disappear within 24 hours.  If you had a scopolamine patch placed behind your ear for the management of post- operative nausea and/or vomiting:  1. The medication in the patch is effective for 72 hours, after which it should be removed.  Wrap patch in a tissue and discard in the trash. Wash hands thoroughly with soap and water. 2. You may remove the patch earlier than 72 hours if you experience unpleasant side effects which may include dry mouth, dizziness or visual disturbances. 3. Avoid touching the patch. Wash your hands with soap and water after contact with the patch.

## 2015-10-19 NOTE — Anesthesia Preprocedure Evaluation (Signed)
Anesthesia Evaluation  Patient identified by MRN, date of birth, ID band Patient awake    Reviewed: Allergy & Precautions, NPO status , Patient's Chart, lab work & pertinent test results  Airway Mallampati: II  TM Distance: >3 FB Neck ROM: Full    Dental no notable dental hx.    Pulmonary neg pulmonary ROS,    Pulmonary exam normal        Cardiovascular Normal cardiovascular exam     Neuro/Psych negative neurological ROS  negative psych ROS   GI/Hepatic negative GI ROS, Neg liver ROS,   Endo/Other  Hypothyroidism   Renal/GU negative Renal ROS     Musculoskeletal   Abdominal   Peds  Hematology   Anesthesia Other Findings Temporal arteritis  Reproductive/Obstetrics                             Anesthesia Physical Anesthesia Plan  ASA: II  Anesthesia Plan: General   Post-op Pain Management:    Induction: Intravenous  Airway Management Planned: Oral ETT  Additional Equipment:   Intra-op Plan:   Post-operative Plan: Extubation in OR  Informed Consent: I have reviewed the patients History and Physical, chart, labs and discussed the procedure including the risks, benefits and alternatives for the proposed anesthesia with the patient or authorized representative who has indicated his/her understanding and acceptance.   Dental advisory given  Plan Discussed with: CRNA  Anesthesia Plan Comments:         Anesthesia Quick Evaluation

## 2015-10-19 NOTE — Anesthesia Procedure Notes (Signed)
Procedure Name: LMA Insertion Performed by: Ioana Louks W Pre-anesthesia Checklist: Patient identified, Emergency Drugs available, Suction available and Patient being monitored Patient Re-evaluated:Patient Re-evaluated prior to inductionOxygen Delivery Method: Circle system utilized Preoxygenation: Pre-oxygenation with 100% oxygen Intubation Type: IV induction Ventilation: Mask ventilation without difficulty LMA: LMA inserted LMA Size: 4.0 Number of attempts: 1 Placement Confirmation: positive ETCO2 Tube secured with: Tape Dental Injury: Teeth and Oropharynx as per pre-operative assessment        

## 2015-10-19 NOTE — Op Note (Signed)
OPERATIVE REPORT  DATE OF OPERATION: 10/19/2015  PATIENT:  Kim Krueger  75 y.o. female  PRE-OPERATIVE DIAGNOSIS:  Temporal arteritis  POST-OPERATIVE DIAGNOSIS:  Temporal arteritis  INDICATIONS FOR OPERATION:  Temporal arteritis, clinically  FINDINGS:  Woody consistency on the artery without palpable pulse  PROCEDURE:  Procedure(s): RIGHT TEMPORAL ARTERY BIOPSY  SURGEON:  Surgeon(s): Judeth Horn, MD  ASSISTANT: None  ANESTHESIA:   local, general and Laryngeal airway  COMPLICATIONS:  None  EBL: <10 ml  BLOOD ADMINISTERED: none  DRAINS: none   SPECIMEN:  Source of Specimen:  Right temporal artery and vein  COUNTS CORRECT:  YES  PROCEDURE DETAILS: The patient was taken to the operating room and placed on the table in supine position. After an adequate general laryngeal airway anesthetic was administered, she was prepped and draped in usual sterile manner exposing her right preauricular area.  A proper timeout was performed identifying the patient and procedure to be performed. Usually the right temporal artery is localized using the palpable temporal artery pulse. However, this patient did not have a palpable pulse and anatomically it had to be localized based on the position of the left temporal artery which was in a preauricular position about 1/2 cm from the pinna,  We anesthetized the area of the medial longitudinal incision approximately 4 cm long and then dissected down into the subcutaneous tissue where initially the temporal vein was found and a small piece removed in order to expose the temporal artery slightly more anterior and deep. Those no palpable pulse in the artery. It had a very firm and woody consistency. Proximal and distal control was obtained using 3-0 Vicryl ties in the interval piece of artery was removed in 2 pieces. It was easily torn. There was no actual blood flow through this vessel at all.  Once the specimen was removed we obtained hemostasis using  electrocautery. 3-0 Vicryl ties also used to obtain hemostasis. We then closed the skin using a running subcuticular stitch of 5-0 Vicryl. Dermabond Steri-Strips and Tegaderm used to complete the dressings.  PATIENT DISPOSITION:  PACU - hemodynamically stable.   Kim Krueger 10/2/201711:46 AM

## 2015-10-19 NOTE — H&P (Signed)
Kim Krueger 10/13/2015 9:18 AM Location: [facility] Patient #: [id number] DOB: [birth year] Married / Language: English / Race: White Female   History of Present Illness Sharyn Lull R. Brooks CMA; 10/13/2015 9:19 AM) Patient words: temp artery bx.  The patient is a 75 year old female.   Other Problems Ventura Sellers, CMA; 10/13/2015 9:19 AM) Bladder Problems Cancer Melanoma Oophorectomy Bilateral. Thyroid Disease  Past Surgical History Ventura Sellers, [place]; 10/13/2015 9:19 AM) Appendectomy Colon Polyp Removal - Colonoscopy Hysterectomy (due to cancer) - Complete  Diagnostic Studies History Ventura Sellers, [place]; 10/13/2015 9:19 AM) Colonoscopy 5-10 years ago Mammogram within last year Pap Smear 1-5 years ago  Allergies Ventura Sellers, CMA; 10/13/2015 9:19 AM) Ceftin *CEPHALOSPORINS* Penicillin G Pot in Dextrose *PENICILLINS* Sulfa 10 *OPHTHALMIC AGENTS*  Medication History Ventura Sellers, CMA; 10/13/2015 9:23 AM) Levothyroxine Sodium (88MCG Tablet, Oral) Active. Vitamin B 12 (Oral) Specific dose unknown - Active. Vitamin D3 (2000UNIT Capsule, Oral) Active. Aspirin (81MG  Tablet, Oral) Active. PredniSONE (20MG  Tablet, Oral) Active. Gabapentin (100MG  Capsule, Oral) Active. Medications Reconciled  Social History Ventura Sellers, [place]; 10/13/2015 9:19 AM) Alcohol use Occasional alcohol use. No caffeine use No drug use Tobacco use Never smoker.  Family History Ventura Sellers, [place]; 10/13/2015 9:19 AM) Colon Cancer Mother. Heart Disease Father.  Pregnancy / Birth History Ventura Sellers, [place]; 10/13/2015 9:19 AM) Age at menarche 76 years. Age of menopause 72-50 Contraceptive History Oral contraceptives. Gravida 1 Maternal age 23-25 Para 1    Review of Systems ([place]. Brooks CMA; 10/13/2015 9:19 AM) General Present- Night Sweats and Weight Loss. Not Present- Appetite Loss, Chills, Fatigue, Fever  and Weight Gain. Skin Not Present- Change in Wart/Mole, Dryness, Hives, Jaundice, New Lesions, Non-Healing Wounds, Rash and Ulcer. HEENT Present- Oral Ulcers and Wears glasses/contact lenses. Not Present- Earache, Hearing Loss, Hoarseness, Nose Bleed, Ringing in the Ears, Seasonal Allergies, Sinus Pain, Sore Throat, Visual Disturbances and Yellow Eyes. Respiratory Not Present- Bloody sputum, Chronic Cough, Difficulty Breathing, Snoring and Wheezing. Breast Not Present- Breast Mass, Breast Pain, Nipple Discharge and Skin Changes. Cardiovascular Not Present- Chest Pain, Difficulty Breathing Lying Down, Leg Cramps, Palpitations, Rapid Heart Rate, Shortness of Breath and Swelling of Extremities. Gastrointestinal Not Present- Abdominal Pain, Bloating, Bloody Stool, Change in Bowel Habits, Chronic diarrhea, Constipation, Difficulty Swallowing, Excessive gas, Gets full quickly at meals, Hemorrhoids, Indigestion, Nausea, Rectal Pain and Vomiting. Female Genitourinary Present- Urgency. Not Present- Frequency, Nocturia, Painful Urination and Pelvic Pain. Musculoskeletal Not Present- Back Pain, Joint Pain, Joint Stiffness, Muscle Pain, Muscle Weakness and Swelling of Extremities. Neurological Present- Headaches and Tingling. Not Present- Decreased Memory, Fainting, Numbness, Seizures, Tremor, Trouble walking and Weakness. Psychiatric Not Present- Anxiety, Bipolar, Change in Sleep Pattern, Depression, Fearful and Frequent crying. Endocrine Not Present- Cold Intolerance, Excessive Hunger, Hair Changes, Heat Intolerance, Hot flashes and New Diabetes. Hematology Not Present- Blood Thinners, Easy Bruising, Excessive bleeding, Gland problems, HIV and Persistent Infections.  Vitals [employer] R. Brooks CMA; 10/13/2015 9:18 AM) 10/13/2015 9:18 AM Weight: 197.13 lb Height: 63in Body Surface Area: 1.92 m Body Mass Index: 34.92 kg/m  BP: 122/74 (Sitting, Left Arm, Standard)  Physical Exam (Claudetta Sallie O. Whitten Andreoni MD;  10/13/2015 10:08 AM) Chest and Lung Exam Chest and lung exam reveals -on auscultation, normal breath sounds, no adventitious sounds and normal vocal resonance.  Cardiovascular Cardiovascular examination reveals -on palpation PMI is normal in location and amplitude, no palpable S3 or S4. Normal cardiac borders. and normal heart sounds, regular rate and  rhythm with no murmurs.  Peripheral Vascular Note: Strong palpable left temporal artery, non-palpable pulse of the right temporal artery. Tenderness of the right temporal area.  Assessment & Plan Jeneen Rinks O. Bernardette Waldron MD; 10/13/2015 10:13 AM) TEMPORAL ARTERITIS (M31.6) Story: Smptoms of right temporal pain and tenderness starting September7, saw PCP-NP 9/11, ED 9/14 for transient spots before eyes. Started then on Prednisone for temporal arteritis, diagnosis supported by [facility] Neurology Dr. Delice Lesch. Biospy is to confirm diagnosis Impression: Will schedule for biopsy this Friday or later if not able to get opthalmology appointment before procedure. Plan for biopsy Friday. Current Plans Pt Education - Giant Cell Arteritis: discussed with patient and provided information.   Signed by Gwenyth Ober, MD (10/13/2015 10:15 AM)  Patient reassessed today:  Lungs clear Cor.  RRR, no murmurs Still no palpable pulse in right temporal/preauricular area.  Kim Krueger. Kim Bailiff, MD, [place] [phone] [facility]

## 2015-10-19 NOTE — Anesthesia Postprocedure Evaluation (Signed)
Anesthesia Post Note  Patient: Kim Krueger  Procedure(s) Performed: Procedure(s) (LRB): RIGHT TEMPORAL ARTERY BIOPSY (Right)  Patient location during evaluation: PACU Anesthesia Type: General Level of consciousness: awake and alert Pain management: pain level controlled Vital Signs Assessment: post-procedure vital signs reviewed and stable Respiratory status: spontaneous breathing, nonlabored ventilation, respiratory function stable and patient connected to nasal cannula oxygen Cardiovascular status: blood pressure returned to baseline and stable Postop Assessment: no signs of nausea or vomiting Anesthetic complications: no    Last Vitals:  Vitals:   10/19/15 1146 10/19/15 1200  BP: 115/67 (!) 141/71  Pulse: 64 72  Resp: 11 11  Temp: 36.4 C     Last Pain:  Vitals:   10/19/15 1200  TempSrc:   PainSc: [place]

## 2015-10-19 NOTE — Transfer of Care (Signed)
Immediate Anesthesia Transfer of Care Note  Patient: Kim Krueger  Procedure(s) Performed: Procedure(s): RIGHT TEMPORAL ARTERY BIOPSY (Right)  Patient Location: PACU  Anesthesia Type:General  Level of Consciousness: awake and sedated  Airway & Oxygen Therapy: Patient Spontanous Breathing and Patient connected to face mask oxygen  Post-op Assessment: Report given to RN and Post -op Vital signs reviewed and stable  Post vital signs: Reviewed and stable  Last Vitals:  Vitals:   10/19/15 0955  BP: (!) 139/59  Pulse: 67  Resp: 18  Temp: 36.7 C    Last Pain:  Vitals:   10/19/15 0955  TempSrc: Oral  PainSc: 0-No pain      Patients Stated Pain Goal: 0 ([id number])  Complications: No apparent anesthesia complications

## 2015-10-21 ENCOUNTER — Encounter (HOSPITAL_BASED_OUTPATIENT_CLINIC_OR_DEPARTMENT_OTHER): Payer: Self-pay | Admitting: General Surgery

## 2015-11-16 DIAGNOSIS — H2513 Age-related nuclear cataract, bilateral: Secondary | ICD-10-CM | POA: Diagnosis not present

## 2015-11-16 DIAGNOSIS — H40013 Open angle with borderline findings, low risk, bilateral: Secondary | ICD-10-CM | POA: Diagnosis not present

## 2015-11-16 DIAGNOSIS — M316 Other giant cell arteritis: Secondary | ICD-10-CM | POA: Diagnosis not present

## 2015-11-17 ENCOUNTER — Telehealth: Payer: Self-pay | Admitting: Neurology

## 2015-11-17 NOTE — Telephone Encounter (Signed)
Contacted patient. She states the surgeon released her last week. She seen eye doctor yesterday and they said she looks good. Patient states last Thurs she started with a headache on right side of head. She wants to know if this could be due to withdraw of medication. Eye doctor told her to call and see if we want medication to be adjusted since she started 2.5 pills last Thursday. Patient states pain is not unbearable.   Per Dr. Delice Lesch patient can go back to taking 3 pills for 3 days and then go back to 2.5 pills a day.

## 2015-11-17 NOTE — Telephone Encounter (Signed)
PT called and wanted to know if Dr Delice Lesch received the biopsy results and would like a call back/Dawn  CB#[phone]

## 2015-11-17 NOTE — Telephone Encounter (Signed)
Pls let her know I did receive it and it confirmed the temporal arteritis. She should still be on the steroid taper we had discussed. Thanks

## 2015-11-17 NOTE — Telephone Encounter (Signed)
Please advise 

## 2015-11-17 NOTE — Telephone Encounter (Signed)
Patient notified

## 2015-11-25 DIAGNOSIS — N390 Urinary tract infection, site not specified: Secondary | ICD-10-CM | POA: Diagnosis not present

## 2015-11-25 DIAGNOSIS — E7800 Pure hypercholesterolemia, unspecified: Secondary | ICD-10-CM | POA: Diagnosis not present

## 2015-11-25 DIAGNOSIS — E0500 Thyrotoxicosis with diffuse goiter without thyrotoxic crisis or storm: Secondary | ICD-10-CM | POA: Diagnosis not present

## 2015-11-25 DIAGNOSIS — M859 Disorder of bone density and structure, unspecified: Secondary | ICD-10-CM | POA: Diagnosis not present

## 2015-11-25 DIAGNOSIS — R8299 Other abnormal findings in urine: Secondary | ICD-10-CM | POA: Diagnosis not present

## 2015-12-02 DIAGNOSIS — E0500 Thyrotoxicosis with diffuse goiter without thyrotoxic crisis or storm: Secondary | ICD-10-CM | POA: Diagnosis not present

## 2015-12-02 DIAGNOSIS — Z8542 Personal history of malignant neoplasm of other parts of uterus: Secondary | ICD-10-CM | POA: Diagnosis not present

## 2015-12-02 DIAGNOSIS — E7800 Pure hypercholesterolemia, unspecified: Secondary | ICD-10-CM | POA: Diagnosis not present

## 2015-12-02 DIAGNOSIS — R1314 Dysphagia, pharyngoesophageal phase: Secondary | ICD-10-CM | POA: Diagnosis not present

## 2015-12-02 DIAGNOSIS — Z0000 Encounter for general adult medical examination without abnormal findings: Secondary | ICD-10-CM | POA: Diagnosis not present

## 2015-12-02 DIAGNOSIS — N905 Atrophy of vulva: Secondary | ICD-10-CM | POA: Diagnosis not present

## 2015-12-02 DIAGNOSIS — M316 Other giant cell arteritis: Secondary | ICD-10-CM | POA: Diagnosis not present

## 2015-12-02 DIAGNOSIS — Z23 Encounter for immunization: Secondary | ICD-10-CM | POA: Diagnosis not present

## 2015-12-02 DIAGNOSIS — M859 Disorder of bone density and structure, unspecified: Secondary | ICD-10-CM | POA: Diagnosis not present

## 2015-12-02 DIAGNOSIS — N3941 Urge incontinence: Secondary | ICD-10-CM | POA: Diagnosis not present

## 2015-12-07 ENCOUNTER — Encounter: Payer: Self-pay | Admitting: Neurology

## 2015-12-07 ENCOUNTER — Ambulatory Visit (INDEPENDENT_AMBULATORY_CARE_PROVIDER_SITE_OTHER): Payer: Medicare Other | Admitting: Neurology

## 2015-12-07 VITALS — BP 148/86 | HR 80 | Ht 63.0 in | Wt 207.0 lb

## 2015-12-07 DIAGNOSIS — M316 Other giant cell arteritis: Secondary | ICD-10-CM

## 2015-12-07 NOTE — Progress Notes (Signed)
NEUROLOGY FOLLOW UP OFFICE NOTE  Kim Krueger [id number]  HISTORY OF PRESENT ILLNESS: I had the pleasure of seeing Kim Krueger in follow-up in the neurology clinic on 12/07/2015.  She is accompanied by her husband who helps supplement the history today. The patient was last seen 2 months ago for concern for temporal arteritis. She underwent temporal artery biopsy which confirmed the diagnosis. She is on a tapering schedule of Prednisone, reducing it to '30mg'$  daily in a few days. She was reporting continued pain on Tramadol, and was given a prescription for low dose gabapentin, which helped. She is taking '100mg'$  BID. She reports 1 or 2 over 10 pain occurring every other day for several hours. She denies any further head pressure. SHe woke up with a dull headache this morning, no nausea/vomiting. Pain is localized on the right side. She has also noticed some numbness behind her right ear, no ear pain. She feels her vision is blurred, but not that bad. She has seen her eye doctor. She denies any focal numbness/tingling/weakness. She denies any joint pains.  HPI 10/05/2015: This is a 75 yo RH woman with a history of hypothyroidism, who presented with new onset headaches. She started having pain around her right ear and localized pain on the right temporal region . She describes the pain as a soreness and ache radiating down her right cheek. She went to her PCP office and was told ear exam was normal, given Levofloxacin and a nasal spray. She saw her dentist a few days after and was told her bite was off and measured her for a mouth guard which she got the same day. She wore this for 2 nights, then woke up Wednesday morning with the right inside of her cheek feeling mutilated from the mouth guard. She was in a lot of pain and taking 3 tablets of Motrin every 4 hours. That evening, she saw 2 black rectangles blocking the lower part of her vision. She went to bed and was woken up by shooting pain on the right  side, no further vision symptoms. She went to the ER on 10/01/15 due to continued pain, reporting taking 28 Motrin over a 24-hour period. She was given a dose of Fioricet which helped for a time. ER records were reviewed, ESR was elevated at 90, CRP elevated at 18.6. CBC and BMP were unremarkable except for mildly elevated creatinine of 1.07. I personally reviewed head CT without contrast which did not show any acute changes, there was age-related atrophy and chronic microvascular disease. She was discharged home on Prednisone '80mg'$  daily and instructions to take Motrin every 4 hours. Pain would go down to a 0/10, but after 4 hours, go back up to 8/10. With starting prednisone, she can go 6-8 hours without taking Motrin. Tylenol did not help. She has lost 10 lbs since pain started due to mouth issues, she feels like there are ulcers in her mouth and only takes soft foods and yoghurt. She denies any ear pain but feels like there is draining inside her right ear, no hearing loss or tinnitus. She denies any conjunctival injection or tearing, no focal numbness/tingling/weakness. No neck/back pain. She denies any prior similar symptoms, no prior history of headaches.   PAST MEDICAL HISTORY: Past Medical History:  Diagnosis Date  . Hypothyroidism   . Temporal arteritis ([place])   . Thyroid disease     MEDICATIONS:  Outpatient Encounter Prescriptions as of 12/07/2015  Medication Sig  . Cholecalciferol (VITAMIN D) 2000  units CAPS Take by mouth.  . Cyanocobalamin (VITAMIN B-12 CR) 1500 MCG TBCR Take by mouth.  . gabapentin (NEURONTIN) 100 MG capsule Take 1 capsule (100 mg total) by mouth at bedtime. Then may increase to 1 capsule BID.  Marland Kitchen levothyroxine (SYNTHROID, LEVOTHROID) 88 MCG tablet Take 88 mcg by mouth daily.  . [DISCONTINUED] traMADol (ULTRAM) 50 MG tablet Take 1-2 tablets (50-100 mg total) by mouth every 6 (six) hours as needed for moderate pain or severe pain.  Prednisone '20mg'$ : Take 1.5 tablets daily,  then reduce as instructed No facility-administered encounter medications on file as of 12/07/2015.      ALLERGIES: Allergies  Allergen Reactions  . Ceftin [Cefuroxime Axetil] Nausea Only    Caused sever yeast infection  . Sulfa Antibiotics   . Penicillins Rash    Has patient had a PCN reaction causing immediate rash, facial/tongue/throat swelling, SOB or lightheadedness with hypotension: Yes Has patient had a PCN reaction causing severe rash involving mucus membranes or skin necrosis: No Has patient had a PCN reaction that required hospitalization No Has patient had a PCN reaction occurring within the last 10 years: No If all of the above answers are "NO", then may proceed with Cephalosporin use.     FAMILY HISTORY: No family history on file.  SOCIAL HISTORY: Social History   Social History  . Marital status: Single    Spouse name: N/A  . Number of children: N/A  . Years of education: N/A   Occupational History  . Not on file.   Social History Main Topics  . Smoking status: Never Smoker  . Smokeless tobacco: Never Used  . Alcohol use No  . Drug use: No  . Sexual activity: Not on file   Other Topics Concern  . Not on file   Social History Narrative  . No narrative on file    REVIEW OF SYSTEMS: Constitutional: No fevers, chills, or sweats, no generalized fatigue, change in appetite Eyes: as above Ear, nose and throat: No hearing loss, ear pain, nasal congestion, sore throat Cardiovascular: No chest pain, palpitations Respiratory:  No shortness of breath at rest or with exertion, wheezes GastrointestinaI: No nausea, vomiting, diarrhea, abdominal pain, fecal incontinence Genitourinary:  No dysuria, urinary retention or frequency Musculoskeletal:  No neck pain, back pain Integumentary: No rash, pruritus, skin lesions Neurological: as above Psychiatric: No depression, insomnia, anxiety Endocrine: No palpitations, fatigue, diaphoresis, mood swings, change in  appetite, change in weight, increased thirst Hematologic/Lymphatic:  No anemia, purpura, petechiae. Allergic/Immunologic: no itchy/runny eyes, nasal congestion, recent allergic reactions, rashes  PHYSICAL EXAM: Vitals:   12/07/15 1016  BP: (!) 148/86  Pulse: 80   General: No acute distress Head:  Normocephalic, well-healing scar in right temporal region Neck: supple, no paraspinal tenderness, full range of motion Heart:  Regular rate and rhythm Lungs:  Clear to auscultation bilaterally Back: No paraspinal tenderness Skin/Extremities: No rash, no edema Neurological Exam: alert and oriented to person, place, and time. No aphasia or dysarthria. Fund of knowledge is appropriate.  Recent and remote memory are intact.  Attention and concentration are normal.    Able to name objects and repeat phrases. Cranial nerves: Pupils equal, round, reactive to light.  Fundoscopic exam unremarkable, no papilledema. Extraocular movements intact with no nystagmus. Visual fields full. Facial sensation intact. No facial asymmetry. Tongue, uvula, palate midline.  Motor: Bulk and tone normal, muscle strength 5/5 throughout with no pronator drift.  Sensation to light touch intact.  No extinction to double simultaneous  stimulation.  Deep tendon reflexes 2+ throughout, toes downgoing.  Finger to nose testing intact.  Gait narrow-based and steady, able to tandem walk adequately.  Romberg negative.  IMPRESSION: This is a 75 yo RH woman with a history of hypothyroidism, who presented with new onset right temporal pain also affecting her right ear and right jaw region. Temporal artery biopsy confirmed temporal arteritis. She is on a tapering dose of Prednisone, she will continue tapering by '10mg'$  every 2 weeks, then as she gets to '10mg'$ , we will start reducing by '1mg'$  every month. We again discussed side effects of Prednisone, continue to monitor glucose levels with her PCP. She has noticed an improvement of pain with gabapentin,  continue '100mg'$  BID. She will follow-up in 2 months and knows to call for any changes.   Thank you for allowing me to participate in her care.  Please do not hesitate to call for any questions or concerns.  The duration of this appointment visit was 25 minutes of face-to-face time with the patient.  Greater than 50% of this time was spent in counseling, explanation of diagnosis, planning of further management, and coordination of care.   Kim Krueger, M.D.   CC: Dr. Osborne Casco

## 2015-12-07 NOTE — Patient Instructions (Signed)
1. Continue with Prednisone taper as scheduled 2. Continue gabapentin 100mg  twice a day 3. Follow-up in 2 months (Jan 3), at which point we will start the lower dose of Prednisone 4. Call for any changes

## 2015-12-17 ENCOUNTER — Encounter: Payer: Self-pay | Admitting: Neurology

## 2015-12-17 DIAGNOSIS — M316 Other giant cell arteritis: Secondary | ICD-10-CM | POA: Insufficient documentation

## 2015-12-23 ENCOUNTER — Telehealth: Payer: Self-pay | Admitting: Neurology

## 2015-12-23 NOTE — Telephone Encounter (Signed)
[facility] # [phone]. She is taking prednisone reduction plan.  What she has been taking is giving her a dull headache. She would like to speak with you regarding this. Thank you

## 2015-12-23 NOTE — Telephone Encounter (Signed)
Stay on 1.5mg  daily for another week, let us know how she is feeling, then we may plan to start the reduction again. Thanks

## 2015-12-23 NOTE — Telephone Encounter (Signed)
Contacted patient she states she has had headache and pressure since Thanksgiving Day when she went to taking 1.5 tablets of Prednisone a day. She is supposed to go to 1 tablet tomorrow but not sure if she should. This normally happens with change in dose but will only last for about a hour. She states she has had hot/cold spells as well. Patient also states she has been having nose bleeds but that could be coming from the outside wood stove. Please advise.

## 2015-12-24 NOTE — Telephone Encounter (Signed)
Patient advised. Verbalized understanding 

## 2015-12-31 DIAGNOSIS — Z1212 Encounter for screening for malignant neoplasm of rectum: Secondary | ICD-10-CM | POA: Diagnosis not present

## 2016-01-20 ENCOUNTER — Ambulatory Visit (INDEPENDENT_AMBULATORY_CARE_PROVIDER_SITE_OTHER): Payer: Medicare Other | Admitting: Neurology

## 2016-01-20 ENCOUNTER — Encounter: Payer: Self-pay | Admitting: Neurology

## 2016-01-20 VITALS — BP 142/84 | HR 93 | Ht 63.0 in | Wt 212.1 lb

## 2016-01-20 DIAGNOSIS — M316 Other giant cell arteritis: Secondary | ICD-10-CM | POA: Diagnosis not present

## 2016-01-20 MED ORDER — PREDNISONE 1 MG PO TABS: ORAL_TABLET | ORAL | 0 refills | Status: DC

## 2016-01-20 MED ORDER — PREDNISONE 5 MG PO TABS: ORAL_TABLET | ORAL | 6 refills | Status: DC

## 2016-01-20 NOTE — Patient Instructions (Signed)
1. Continue with reduction of Prednisone. With your current pills of Prednisone '20mg'$ , take 1/2 tablet for another week. 2. After this, we will change your prescription:  - Take Prednisone '5mg'$ , 1 tablet in addition to Prednisone '1mg'$ , 4 tablets (total '9mg'$ ) for 1 month, then - Take Prednisone '5mg'$ , 1 tablet in addition to Prednisone '1mg'$ , 3 tablets (total '8mg'$ ) for 1 month, then - Take Prednisone '5mg'$ , 1 tablet in addition to Prednisone '1mg'$ , 2 tablets (total '7mg'$ ) for 1 month, then - Take Prednisone '5mg'$ , 1 tablet in addition to Prednisone '1mg'$ , 1 tablets (total '6mg'$ ) for 1 month, then  - Take Prednisone '5mg'$  for a month, then - Take Prednisone '1mg'$ , 4 tablets (total '4mg'$ ) for a month, then - Take Prednisone '1mg'$ , 3 tablets (total '3mg'$ ) for a month, then - Take Prednisone '1mg'$ , 2 tablets (total '2mg'$ ) for a month, then - Take Prednisone '1mg'$ , 1 tablet daily for a month, then stop 3. Bloodwork for ESR, CRP, BMP 4. Reduce gabapentin '100mg'$ , take 1 cap at night and monitor headaches 5. Restart baby aspirin 6. Follow-up in 3 months

## 2016-01-20 NOTE — Progress Notes (Signed)
NEUROLOGY FOLLOW UP OFFICE NOTE  Kim Krueger [id number]  HISTORY OF PRESENT ILLNESS: I had the pleasure of seeing Kim Krueger in follow-up in the neurology clinic on 01/20/2016.  She is accompanied by her husband who helps supplement the history today. The patient was last seen 2 months ago for right temporal arteritis. She underwent temporal artery biopsy which confirmed the diagnosis. She is on a tapering schedule of Prednisone, taking '10mg'$  daily for the past week. She has on and off headaches, worse a few days after reducing prednisone, she feels pressure over the right brow radiating to the right temporal region, sometimes pain is bad that she feels like her head is blown up with air. No associated nausea or vomiting. She is unsure if the gabapentin is helping now, sometimes she thinks she has a headache after the taking the medication, and she also wonders if it is making her vision blurred at times. She denies any headaches today. She stopped aspirin after she had nosebleeds, but now thinks the air may have been dry and is using a humidifier. She denies any vision loss, no dizziness, no focal numbness/tingling/weakness.  HPI 10/05/2015: This is a 76 yo RH woman with a history of hypothyroidism, who presented with new onset headaches. She started having pain around her right ear and localized pain on the right temporal region . She describes the pain as a soreness and ache radiating down her right cheek. She went to her PCP office and was told ear exam was normal, given Levofloxacin and a nasal spray. She saw her dentist a few days after and was told her bite was off and measured her for a mouth guard which she got the same day. She wore this for 2 nights, then woke up Wednesday morning with the right inside of her cheek feeling mutilated from the mouth guard. She was in a lot of pain and taking 3 tablets of Motrin every 4 hours. That evening, she saw 2 black rectangles blocking the lower part of  her vision. She went to bed and was woken up by shooting pain on the right side, no further vision symptoms. She went to the ER on 10/01/15 due to continued pain, reporting taking 28 Motrin over a 24-hour period. She was given a dose of Fioricet which helped for a time. ER records were reviewed, ESR was elevated at 90, CRP elevated at 18.6. CBC and BMP were unremarkable except for mildly elevated creatinine of 1.07. I personally reviewed head CT without contrast which did not show any acute changes, there was age-related atrophy and chronic microvascular disease. She was discharged home on Prednisone '80mg'$  daily and instructions to take Motrin every 4 hours. Pain would go down to a 0/10, but after 4 hours, go back up to 8/10. With starting prednisone, she can go 6-8 hours without taking Motrin. Tylenol did not help. She has lost 10 lbs since pain started due to mouth issues, she feels like there are ulcers in her mouth and only takes soft foods and yoghurt. She denies any ear pain but feels like there is draining inside her right ear, no hearing loss or tinnitus. She denies any conjunctival injection or tearing, no focal numbness/tingling/weakness. No neck/back pain. She denies any prior similar symptoms, no prior history of headaches.   PAST MEDICAL HISTORY: Past Medical History:  Diagnosis Date  . Hypothyroidism   . Temporal arteritis ([place])   . Thyroid disease     MEDICATIONS:  Outpatient Encounter Prescriptions  as of 12/07/2015  Medication Sig  . Cholecalciferol (VITAMIN D) 2000 units CAPS Take by mouth.  . Cyanocobalamin (VITAMIN B-12 CR) 1500 MCG TBCR Take by mouth.  . gabapentin (NEURONTIN) 100 MG capsule Take 1 capsule (100 mg total) by mouth at bedtime. Then may increase to 1 capsule BID.  Marland Kitchen levothyroxine (SYNTHROID, LEVOTHROID) 88 MCG tablet Take 88 mcg by mouth daily.  . [DISCONTINUED] traMADol (ULTRAM) 50 MG tablet Take 1-2 tablets (50-100 mg total) by mouth every 6 (six) hours as needed  for moderate pain or severe pain.  Prednisone '20mg'$ : Take 0.5 tablets daily No facility-administered encounter medications on file as of 12/07/2015.      ALLERGIES: Allergies  Allergen Reactions  . Ceftin [Cefuroxime Axetil] Nausea Only    Caused sever yeast infection  . Sulfa Antibiotics   . Penicillins Rash    Has patient had a PCN reaction causing immediate rash, facial/tongue/throat swelling, SOB or lightheadedness with hypotension: Yes Has patient had a PCN reaction causing severe rash involving mucus membranes or skin necrosis: No Has patient had a PCN reaction that required hospitalization No Has patient had a PCN reaction occurring within the last 10 years: No If all of the above answers are "NO", then may proceed with Cephalosporin use.     FAMILY HISTORY: No family history on file.  SOCIAL HISTORY: Social History   Social History  . Marital status: Single    Spouse name: N/A  . Number of children: N/A  . Years of education: N/A   Occupational History  . Not on file.   Social History Main Topics  . Smoking status: Never Smoker  . Smokeless tobacco: Never Used  . Alcohol use No  . Drug use: No  . Sexual activity: Not on file   Other Topics Concern  . Not on file   Social History Narrative  . No narrative on file    REVIEW OF SYSTEMS: Constitutional: No fevers, chills, or sweats, no generalized fatigue, change in appetite Eyes: as above Ear, nose and throat: No hearing loss, ear pain, nasal congestion, sore throat Cardiovascular: No chest pain, palpitations Respiratory:  No shortness of breath at rest or with exertion, wheezes GastrointestinaI: No nausea, vomiting, diarrhea, abdominal pain, fecal incontinence Genitourinary:  No dysuria, urinary retention or frequency Musculoskeletal:  No neck pain, back pain Integumentary: No rash, pruritus, skin lesions Neurological: as above Psychiatric: No depression, insomnia, anxiety Endocrine: No palpitations,  fatigue, diaphoresis, mood swings, change in appetite, change in weight, increased thirst Hematologic/Lymphatic:  No anemia, purpura, petechiae. Allergic/Immunologic: no itchy/runny eyes, nasal congestion, recent allergic reactions, rashes  PHYSICAL EXAM: Vitals:   01/20/16 1317  BP: (!) 142/84  Pulse: 93   General: No acute distress Head:  Normocephalic, well-healing scar in right temporal region Neck: supple, no paraspinal tenderness, full range of motion Heart:  Regular rate and rhythm Lungs:  Clear to auscultation bilaterally Back: No paraspinal tenderness Skin/Extremities: No rash, no edema Neurological Exam: alert and oriented to person, place, and time. No aphasia or dysarthria. Fund of knowledge is appropriate.  Recent and remote memory are intact.  Attention and concentration are normal.    Able to name objects and repeat phrases. Cranial nerves: Pupils equal, round, reactive to light.  Fundoscopic exam unremarkable, no papilledema. Extraocular movements intact with no nystagmus. Visual fields full. Facial sensation intact. No facial asymmetry. Tongue, uvula, palate midline.  Motor: Bulk and tone normal, muscle strength 5/5 throughout with no pronator drift.  Sensation to light  touch intact.  No extinction to double simultaneous stimulation.  Deep tendon reflexes 2+ throughout, toes downgoing.  Finger to nose testing intact.  Gait narrow-based and steady, able to tandem walk adequately.  Romberg negative.  IMPRESSION: This is a 77 yo RH woman with a history of hypothyroidism, who presented with new onset right temporal pain also affecting her right ear and right jaw region. Temporal artery biopsy confirmed temporal arteritis. She continues to taper prednisone, currently on '10mg'$  daily. A week from now, she will start tapering medication by '1mg'$  a month. She is unsure if gabapentin is actually causing headaches, and would like to taper down, she was given instructions to slowly wean off  gabapentin. If headaches worsen, we will resume dose and potentially increase, she is not having any side effects on low dose. We again discussed side effects of Prednisone, continue to monitor glucose levels. Since she is still having headaches, we will re-check ESR and CRP. Resume aspirin '81mg'$  daily. She will follow-up in 3 months and knows to call for any changes.   Thank you for allowing me to participate in her care.  Please do not hesitate to call for any questions or concerns.  The duration of this appointment visit was 25 minutes of face-to-face time with the patient.  Greater than 50% of this time was spent in counseling, explanation of diagnosis, planning of further management, and coordination of care.   Ellouise Newer, M.D.   CC: Dr. Osborne Casco

## 2016-01-21 ENCOUNTER — Other Ambulatory Visit: Payer: Self-pay | Admitting: Neurology

## 2016-01-21 DIAGNOSIS — M316 Other giant cell arteritis: Secondary | ICD-10-CM | POA: Diagnosis not present

## 2016-01-22 ENCOUNTER — Telehealth: Payer: Self-pay | Admitting: Neurology

## 2016-01-22 ENCOUNTER — Other Ambulatory Visit: Payer: Self-pay

## 2016-01-22 LAB — BASIC METABOLIC PANEL
BUN / CREAT RATIO: 16 (ref 12–28)
BUN: 15 mg/dL (ref 8–27)
CO2: 21 mmol/L (ref 18–29)
CREATININE: 0.96 mg/dL (ref 0.57–1.00)
Calcium: 9.4 mg/dL (ref 8.7–10.3)
Chloride: 100 mmol/L (ref 96–106)
GFR, EST AFRICAN AMERICAN: 68 mL/min/{1.73_m2} (ref >59)
GFR, EST NON AFRICAN AMERICAN: 59 mL/min/{1.73_m2} — AB (ref >59)
GLUCOSE: 92 mg/dL (ref 65–99)
Potassium: 4.4 mmol/L (ref 3.5–5.2)
Sodium: 140 mmol/L (ref 134–144)

## 2016-01-22 LAB — SEDIMENTATION RATE: Sed Rate: 29 mm/hr (ref 0–40)

## 2016-01-22 LAB — C-REACTIVE PROTEIN: CRP: 6.3 mg/L — ABNORMAL HIGH (ref 0.0–4.9)

## 2016-01-22 MED ORDER — PREDNISONE 10 MG PO TABS: 10.0000 mg | ORAL_TABLET | Freq: Every day | ORAL | 0 refills | Status: DC

## 2016-01-22 NOTE — Telephone Encounter (Signed)
PT returned your call about her lab work results/Kim Krueger

## 2016-01-22 NOTE — Telephone Encounter (Signed)
Patient notified of Lab results. Sent in Rx. Per Dr. Delice Lesch okay to fax lab order. Patient states she is doing good not taking Gabapentin today and yesterday. Dr. Delice Lesch notified.

## 2016-01-25 ENCOUNTER — Other Ambulatory Visit: Payer: Self-pay

## 2016-01-25 DIAGNOSIS — M316 Other giant cell arteritis: Secondary | ICD-10-CM

## 2016-02-17 ENCOUNTER — Other Ambulatory Visit: Payer: Self-pay

## 2016-02-17 DIAGNOSIS — M316 Other giant cell arteritis: Secondary | ICD-10-CM

## 2016-02-17 MED ORDER — PREDNISONE 1 MG PO TABS: ORAL_TABLET | ORAL | 0 refills | Status: DC

## 2016-02-18 ENCOUNTER — Other Ambulatory Visit: Payer: Self-pay | Admitting: Neurology

## 2016-02-18 DIAGNOSIS — M316 Other giant cell arteritis: Secondary | ICD-10-CM | POA: Diagnosis not present

## 2016-02-19 LAB — C-REACTIVE PROTEIN: CRP: 4.4 mg/L (ref 0.0–4.9)

## 2016-02-22 ENCOUNTER — Telehealth: Payer: Self-pay

## 2016-02-22 NOTE — Telephone Encounter (Signed)
I haven't received anything, pls call their lab, thanks!

## 2016-02-22 NOTE — Telephone Encounter (Signed)
Patient called and states she had her blood work done [place] in [place]. Do we have these results?

## 2016-02-23 NOTE — Telephone Encounter (Signed)
Pls let her know repeat CRP is now normal, 4.4 (range 0 to 4.9). Proceed with slow taper of Prednisone by 1mg  every month. Thanks!

## 2016-02-23 NOTE — Telephone Encounter (Signed)
Lab received. Please advise.

## 2016-02-24 NOTE — Telephone Encounter (Signed)
Patient notified.  Verbalized understanding. 

## 2016-03-08 ENCOUNTER — Telehealth: Payer: Self-pay

## 2016-03-08 NOTE — Telephone Encounter (Signed)
Patient left VM  she has had stomach pain on and off since last Thursday. She is taking the 9mg  of Prednisone and Gabapentin 100mg  and wondering if these medications could be causing this or should she be contacting her PCP.

## 2016-03-08 NOTE — Telephone Encounter (Signed)
Patient advised.

## 2016-03-08 NOTE — Telephone Encounter (Signed)
Would call PCP first to see if there is anything else potentially going on. Thanks

## 2016-03-09 ENCOUNTER — Emergency Department (HOSPITAL_COMMUNITY)
Admission: EM | Admit: 2016-03-09 | Discharge: 2016-03-10 | Disposition: A | Discharge status: Home / Self Care | Payer: Medicare Other | Attending: Emergency Medicine | Admitting: Emergency Medicine

## 2016-03-09 ENCOUNTER — Encounter (HOSPITAL_COMMUNITY): Payer: Self-pay | Admitting: Emergency Medicine

## 2016-03-09 ENCOUNTER — Emergency Department (HOSPITAL_COMMUNITY): Payer: Medicare Other

## 2016-03-09 DIAGNOSIS — Z79899 Other long term (current) drug therapy: Secondary | ICD-10-CM | POA: Insufficient documentation

## 2016-03-09 DIAGNOSIS — E039 Hypothyroidism, unspecified: Secondary | ICD-10-CM | POA: Insufficient documentation

## 2016-03-09 DIAGNOSIS — R1030 Lower abdominal pain, unspecified: Secondary | ICD-10-CM | POA: Diagnosis present

## 2016-03-09 DIAGNOSIS — R109 Unspecified abdominal pain: Secondary | ICD-10-CM | POA: Diagnosis not present

## 2016-03-09 DIAGNOSIS — K439 Ventral hernia without obstruction or gangrene: Secondary | ICD-10-CM

## 2016-03-09 LAB — COMPREHENSIVE METABOLIC PANEL
ALT: 13 U/L — AB (ref 14–54)
AST: 16 U/L (ref 15–41)
Albumin: 3.6 g/dL (ref 3.5–5.0)
Alkaline Phosphatase: 76 U/L (ref 38–126)
Anion gap: 7 (ref 5–15)
BUN: 10 mg/dL (ref 6–20)
CHLORIDE: 106 mmol/L (ref 101–111)
CO2: 27 mmol/L (ref 22–32)
CREATININE: 0.95 mg/dL (ref 0.44–1.00)
Calcium: 9.8 mg/dL (ref 8.9–10.3)
GFR, EST NON AFRICAN AMERICAN: 58 mL/min — AB (ref >60)
Glucose, Bld: 104 mg/dL — ABNORMAL HIGH (ref 65–99)
POTASSIUM: 4 mmol/L (ref 3.5–5.1)
SODIUM: 140 mmol/L (ref 135–145)
Total Bilirubin: 0.5 mg/dL (ref 0.3–1.2)
Total Protein: 6.7 g/dL (ref 6.5–8.1)

## 2016-03-09 LAB — URINALYSIS, ROUTINE W REFLEX MICROSCOPIC
Bilirubin Urine: NEGATIVE
Glucose, UA: NEGATIVE mg/dL
HGB URINE DIPSTICK: NEGATIVE
KETONES UR: NEGATIVE mg/dL
LEUKOCYTES UA: NEGATIVE
Nitrite: POSITIVE — AB
PROTEIN: NEGATIVE mg/dL
Specific Gravity, Urine: 1.011 (ref 1.005–1.030)
pH: 5 (ref 5.0–8.0)

## 2016-03-09 LAB — CBC
HEMATOCRIT: 37.1 % (ref 36.0–46.0)
Hemoglobin: 11.3 g/dL — ABNORMAL LOW (ref 12.0–15.0)
MCH: 24.9 pg — AB (ref 26.0–34.0)
MCHC: 30.5 g/dL (ref 30.0–36.0)
MCV: 81.7 fL (ref 78.0–100.0)
Platelets: 304 10*3/uL (ref 150–400)
RBC: 4.54 MIL/uL (ref 3.87–5.11)
RDW: 14.9 % (ref 11.5–15.5)
WBC: 9.5 10*3/uL (ref 4.0–10.5)

## 2016-03-09 LAB — LIPASE, BLOOD: LIPASE: 19 U/L (ref 11–51)

## 2016-03-09 MED ORDER — IOPAMIDOL (ISOVUE-300) INJECTION 61%
INTRAVENOUS | Status: AC
  Filled 2016-03-09: qty 100

## 2016-03-09 MED ORDER — SODIUM CHLORIDE 0.9 % IJ SOLN
INTRAMUSCULAR | Status: AC
  Filled 2016-03-09: qty 50

## 2016-03-09 MED ORDER — IOPAMIDOL (ISOVUE-300) INJECTION 61%
100.0000 mL | Freq: Once | INTRAVENOUS | Status: AC | PRN
  Administered 2016-03-09: 100 mL via INTRAVENOUS

## 2016-03-09 NOTE — ED Provider Notes (Signed)
[facility] DEPT Provider Note   CSN: [id number] Arrival date & time: 03/09/16  1918     History   Chief Complaint Chief Complaint  Patient presents with  . Abdominal Pain    HPI Kim Krueger is a 76 y.o. female.  76 year old female with a history of temporal arteritis presents to the emergency department for evaluation of abdominal pain. She states that she has had abdominal pain intermittently over the past 6 days. Pain has become constant throughout the day today. She has noted pain mostly in her lower abdomen. She had a bowel movement yesterday and the day prior, but denies any bowel movements today. She has been passing a small amount of flatus. She feels as though her abdomen is more distended. She is not taken any medications for her symptoms. She denies fever, nausea, vomiting, melena, hematochezia, or urinary symptoms. She does have urgency incontinence at baseline. She denies any worsening of this. Abdominal surgical history significant for abdominal hysterectomy and partial appendectomy.      Past Medical History:  Diagnosis Date  . Hypothyroidism   . Temporal arteritis ([place])   . Thyroid disease     Patient Active Problem List   Diagnosis Date Noted  . Temporal arteritis ([place]) 12/17/2015    Past Surgical History:  Procedure Laterality Date  . ABDOMINAL HYSTERECTOMY    . APPENDECTOMY    . ARTERY BIOPSY Right 10/19/2015   Procedure: RIGHT TEMPORAL ARTERY BIOPSY;  Surgeon: Judeth Horn, MD;  Location: [facility];  Service: General;  Laterality: Right;    OB History    No data available       Home Medications    Prior to Admission medications   Medication Sig Start Date End Date Taking? Authorizing Provider  Cholecalciferol (VITAMIN D) 2000 units CAPS Take 1 capsule by mouth daily.    Yes Historical Provider, MD  Cyanocobalamin (VITAMIN B-12 CR) 1500 MCG TBCR Take 1 tablet by mouth daily.    Yes Historical Provider, MD  gabapentin  (NEURONTIN) 100 MG capsule Take 1 capsule (100 mg total) by mouth at bedtime. Then may increase to 1 capsule BID. 10/09/15  Yes Cameron Sprang, MD  levothyroxine (SYNTHROID, LEVOTHROID) 88 MCG tablet Take 88 mcg by mouth daily. 09/23/15  Yes Historical Provider, MD  predniSONE (DELTASONE) 1 MG tablet Take 3 tablets daily. (Take with Prednisone 5mg  tablet, for a total of 8mg  daily) Patient taking differently: Take 4 mg by mouth daily with breakfast. Take 4 tablets daily. (Take with Prednisone 5mg  tablet, for a total of  9 mg daily) 02/17/16  Yes Cameron Sprang, MD  predniSONE (DELTASONE) 5 MG tablet Take 1 tablet daily. 01/20/16  Yes Cameron Sprang, MD  predniSONE (DELTASONE) 10 MG tablet Take 1 tablet (10 mg total) by mouth daily with breakfast. Patient not taking: Reported on 03/09/2016 01/22/16   Cameron Sprang, MD  traMADol (ULTRAM) 50 MG tablet Take 1 tablet (50 mg total) by mouth every 6 (six) hours as needed for severe pain. 03/10/16   Antonietta Breach, PA-C    Family History No family history on file.  Social History Social History  Substance Use Topics  . Smoking status: Never Smoker  . Smokeless tobacco: Never Used  . Alcohol use No     Allergies   Ceftin [cefuroxime axetil]; Sulfa antibiotics; and Penicillins   Review of Systems Review of Systems Ten systems reviewed and are negative for acute change, except as noted in the HPI.  Physical Exam Updated Vital Signs BP 155/78 (BP Location: Left Arm)   Pulse 75   Temp 98.8 F (37.1 C) (Oral)   Resp 18   Ht 5\' 3"  (1.6 m)   Wt 92.1 kg   SpO2 98%   BMI 35.96 kg/m   Physical Exam  Constitutional: She is oriented to person, place, and time. She appears well-developed and well-nourished. No distress.  Nontoxic and in NAD  HENT:  Head: Normocephalic and atraumatic.  Eyes: Conjunctivae and EOM are normal. No scleral icterus.  Neck: Normal range of motion.  Cardiovascular: Normal rate, regular rhythm and intact distal pulses.     Pulmonary/Chest: Effort normal. No respiratory distress. She has no wheezes. She has no rales.  Respirations even and unlabored  Abdominal: Soft. She exhibits no distension. There is tenderness. There is no guarding. A hernia is present.  Soft, obese abdomen. There is a palpable ventral hernia inferior to the umbilicus. This is fully reducible. No overlying ecchymosis or erythema. No peritoneal signs or guarding. Bowel sounds mildly hyperactive.  Musculoskeletal: Normal range of motion.  Neurological: She is alert and oriented to person, place, and time. She exhibits normal muscle tone. Coordination normal.  GCS 15. Speech is goal oriented. Patient moving all extremities.  Skin: Skin is warm and dry. No rash noted. She is not diaphoretic. No erythema. No pallor.  Psychiatric: She has a normal mood and affect. Her behavior is normal.  Nursing note and vitals reviewed.    ED Treatments / Results  Labs (all labs ordered are listed, but only abnormal results are displayed) Labs Reviewed  COMPREHENSIVE METABOLIC PANEL - Abnormal; Notable for the following:       Result Value   Glucose, Bld 104 (*)    ALT 13 (*)    GFR calc non Af Amer 58 (*)    All other components within normal limits  CBC - Abnormal; Notable for the following:    Hemoglobin 11.3 (*)    MCH 24.9 (*)    All other components within normal limits  URINALYSIS, ROUTINE W REFLEX MICROSCOPIC - Abnormal; Notable for the following:    APPearance HAZY (*)    Nitrite POSITIVE (*)    Bacteria, UA MANY (*)    Squamous Epithelial / LPF 0-5 (*)    All other components within normal limits  URINE CULTURE  LIPASE, BLOOD    EKG  EKG Interpretation None       Radiology Ct Abdomen Pelvis W Contrast  Result Date: 03/09/2016 CLINICAL DATA:  Intermittent abdominal pain EXAM: CT ABDOMEN AND PELVIS WITH CONTRAST TECHNIQUE: Multidetector CT imaging of the abdomen and pelvis was performed using the standard protocol following bolus  administration of intravenous contrast. CONTRAST:  170mL ISOVUE-300 IOPAMIDOL (ISOVUE-300) INJECTION 61% COMPARISON:  None. FINDINGS: Lower chest: Mild dependent atelectasis. No acute consolidation or pleural effusion. Borderline heart size. Moderate hiatal hernia. Hepatobiliary: No focal liver abnormality is seen. No gallstones, gallbladder wall thickening, or biliary dilatation. Pancreas: Unremarkable. No pancreatic ductal dilatation or surrounding inflammatory changes. Spleen: Normal in size without focal abnormality. Adrenals/Urinary Tract: Adrenal glands are unremarkable. Kidneys are normal, without renal calculi, focal lesion, or hydronephrosis. There is a small right-sided bladder diverticulum. Stomach/Bowel: Stomach is within normal limits. Appendix appears normal. No evidence of bowel wall thickening, distention, or inflammatory changes. Vascular/Lymphatic: Aortic atherosclerosis. No enlarged abdominal or pelvic lymph nodes. Reproductive: Status post hysterectomy. No adnexal masses. Other: Moderate ventral hernia in the region of the umbilicus with anterior abdominal  wall defect measuring 3.4 cm transverse. Hernia sac contains mesentery. No bowel contents. There is edema and fluid within the hernia sac. No free air. Small amount of free fluid in the pelvis. Musculoskeletal: Grade 1 anterolisthesis of L4 on L5. There are degenerative changes. IMPRESSION: 1. Moderate ventral hernia in the region of the umbilicus, this contains mesenteric fat. There is fluid and edema within the hernia sac, concerning for incarceration. There is no bowel within the hernia sac and no evidence for an obstruction. 2. Small amount of free fluid in the pelvis 3. Small bladder diverticulum Electronically Signed   By: Donavan Foil M.D.   On: 03/09/2016 23:27    Procedures Procedures (including critical care time)  Medications Ordered in ED Medications  iopamidol (ISOVUE-300) 61 % injection (not administered)  sodium  chloride 0.9 % injection (not administered)  iopamidol (ISOVUE-300) 61 % injection 100 mL (100 mLs Intravenous Contrast Given 03/09/16 2250)     Initial Impression / Assessment and Plan / ED Course  I have reviewed the triage vital signs and the nursing notes.  Pertinent labs & imaging results that were available during my care of the patient were reviewed by me and considered in my medical decision making (see chart for details).     76 year old female presents to the emergency department for evaluation of abdominal pain. Pain is specifically associated with a palpable ventral hernia inferior to the umbilicus. This hernia is reproducible at the bedside. No overlying ecchymosis or erythema. Patient denies any other associated symptoms. No vomiting, diarrhea, dysuria, hematuria. Patient states that she has urgency incontinence at baseline. She denies any changes with this.  Patient is afebrile. She has no leukocytosis. Chemistry workup is reassuring. Urinalysis suggests potential urinary tract infection, but patient is asymptomatic. Will culture and hold treatment. Pain does not appear consistent with that of a UTI. No evidence of emergent or surgical process on abdominal CT. Will continue with supportive management. Patient given a prescription for an abdominal binder as well as referral to general surgery for outpatient follow-up. Return precautions discussed and provided. Patient seen and examined also by my attending, Dr. Jeneen Rinks, who is in agreement with this workup, assessment, management plan, and patient's stability for discharge.   Final Clinical Impressions(s) / ED Diagnoses   Final diagnoses:  Ventral hernia without obstruction or gangrene    New Prescriptions Discharge Medication List as of 03/10/2016 12:28 AM    START taking these medications   Details  traMADol (ULTRAM) 50 MG tablet Take 1 tablet (50 mg total) by mouth every 6 (six) hours as needed for severe pain., Starting Thu  03/10/2016, Print         [place], PA-C 03/10/16 [id number]    Tanna Furry, MD 03/21/16 2011

## 2016-03-09 NOTE — ED Triage Notes (Signed)
Pt from home with complaints of intermittent abdominal pain that began on 2/15. Pt states pain moves around, but began in her lower abdomen. Pt states she "feels distended". Pt's LBM was last night. Pt is able to pass gas. Pt denies emesis and nausea. Pt denies diarrhea. Pt states the pain is worse when she tries to stand and she describes the pain as sharp

## 2016-03-10 MED ORDER — TRAMADOL HCL 50 MG PO TABS: 50.0000 mg | ORAL_TABLET | Freq: Four times a day (QID) | ORAL | 0 refills | Status: DC | PRN

## 2016-03-10 NOTE — Discharge Instructions (Signed)
Avoid strenuous activity or heavy lifting. We advised use of Tylenol for pain. You may use tramadol as prescribed for severe pain. Use an abdominal binder to prevent worsening of your hernia. Follow-up with a general surgeon to discuss elective hernia repair. You may return to the emergency department as needed for new or concerning symptoms.

## 2016-03-10 NOTE — ED Notes (Signed)
Discharge information reviewed with patient and husband. Both verbalized understanding of RX to obtain abdominal binder. Pt refused RX for tramadol stating that would make the pain worst along with Oxycodone. Per Claiborne Billings, [place] patient advised to take Tylenol or ibuprofen for pain and follow up with surgeon.

## 2016-03-12 LAB — URINE CULTURE: Culture: >=100000 — AB

## 2016-03-13 ENCOUNTER — Telehealth: Payer: Self-pay

## 2016-03-13 NOTE — Progress Notes (Signed)
ED Antimicrobial Stewardship Positive Culture Follow Up   Kim Krueger is an 76 y.o. female who presented to [facility] on 03/09/2016 with a chief complaint of  Chief Complaint  Patient presents with  . Abdominal Pain    Recent Results (from the past 720 hour(s))  Urine culture     Status: Abnormal   Collection Time: 03/09/16  9:54 PM  Result Value Ref Range Status   Specimen Description URINE, RANDOM  Final   Special Requests NONE  Final   Culture >=100,000 COLONIES/mL KLEBSIELLA OXYTOCA (A)  Final   Report Status 03/12/2016 FINAL  Final   Organism ID, Bacteria KLEBSIELLA OXYTOCA (A)  Final      Susceptibility   Klebsiella oxytoca - MIC*    AMPICILLIN >=32 RESISTANT Resistant     CEFAZOLIN 16 SENSITIVE Sensitive     CEFTRIAXONE <=1 SENSITIVE Sensitive     CIPROFLOXACIN <=0.25 SENSITIVE Sensitive     GENTAMICIN <=1 SENSITIVE Sensitive     IMIPENEM <=0.25 SENSITIVE Sensitive     NITROFURANTOIN <=16 SENSITIVE Sensitive     TRIMETH/SULFA <=20 SENSITIVE Sensitive     AMPICILLIN/SULBACTAM 4 SENSITIVE Sensitive     PIP/TAZO <=4 SENSITIVE Sensitive     Extended ESBL NEGATIVE Sensitive     * >=100,000 COLONIES/mL KLEBSIELLA OXYTOCA    []  Treated with , organism resistant to prescribed antimicrobial [x]  Patient discharged originally without antimicrobial agent and treatment is now indicated  Please complete symptom check, if symptomatic for UTI:  New antibiotic prescription: Macrobid 100 mg po bid x 5 days  ED Provider: K. [address] Bryson Ha M 03/13/2016, 1:08 PM

## 2016-03-14 ENCOUNTER — Telehealth: Payer: Self-pay | Admitting: Emergency Medicine

## 2016-03-15 ENCOUNTER — Ambulatory Visit: Payer: Self-pay | Admitting: General Surgery

## 2016-03-15 DIAGNOSIS — K429 Umbilical hernia without obstruction or gangrene: Secondary | ICD-10-CM | POA: Diagnosis not present

## 2016-03-16 ENCOUNTER — Other Ambulatory Visit: Payer: Self-pay | Admitting: Neurology

## 2016-03-16 DIAGNOSIS — M316 Other giant cell arteritis: Secondary | ICD-10-CM

## 2016-03-17 HISTORY — PX: HERNIA REPAIR: SHX51

## 2016-03-25 ENCOUNTER — Encounter (HOSPITAL_COMMUNITY): Payer: Self-pay

## 2016-03-25 ENCOUNTER — Other Ambulatory Visit: Payer: Self-pay

## 2016-03-25 ENCOUNTER — Encounter (HOSPITAL_COMMUNITY)
Admission: RE | Admit: 2016-03-25 | Discharge: 2016-03-25 | Disposition: A | Discharge status: Home / Self Care | Payer: Medicare Other | Source: Ambulatory Visit | Attending: General Surgery | Admitting: General Surgery

## 2016-03-25 DIAGNOSIS — I739 Peripheral vascular disease, unspecified: Secondary | ICD-10-CM | POA: Diagnosis not present

## 2016-03-25 DIAGNOSIS — Z7982 Long term (current) use of aspirin: Secondary | ICD-10-CM | POA: Diagnosis not present

## 2016-03-25 DIAGNOSIS — Z79899 Other long term (current) drug therapy: Secondary | ICD-10-CM | POA: Diagnosis not present

## 2016-03-25 DIAGNOSIS — E039 Hypothyroidism, unspecified: Secondary | ICD-10-CM | POA: Diagnosis not present

## 2016-03-25 DIAGNOSIS — K429 Umbilical hernia without obstruction or gangrene: Secondary | ICD-10-CM | POA: Diagnosis not present

## 2016-03-25 HISTORY — DX: Malignant (primary) neoplasm, unspecified: C80.1

## 2016-03-25 HISTORY — DX: Urge incontinence: N39.41

## 2016-03-25 LAB — CBC
HEMATOCRIT: 36.1 % (ref 36.0–46.0)
Hemoglobin: 10.8 g/dL — ABNORMAL LOW (ref 12.0–15.0)
MCH: 25.1 pg — ABNORMAL LOW (ref 26.0–34.0)
MCHC: 29.9 g/dL — ABNORMAL LOW (ref 30.0–36.0)
MCV: 83.8 fL (ref 78.0–100.0)
Platelets: 291 10*3/uL (ref 150–400)
RBC: 4.31 MIL/uL (ref 3.87–5.11)
RDW: 15.1 % (ref 11.5–15.5)
WBC: 9.5 10*3/uL (ref 4.0–10.5)

## 2016-03-25 LAB — BASIC METABOLIC PANEL
ANION GAP: 8 (ref 5–15)
BUN: 9 mg/dL (ref 6–20)
CALCIUM: 9.4 mg/dL (ref 8.9–10.3)
CO2: 24 mmol/L (ref 22–32)
Chloride: 109 mmol/L (ref 101–111)
Creatinine, Ser: 0.94 mg/dL (ref 0.44–1.00)
GFR calc Af Amer: >60 mL/min (ref >60)
GFR, EST NON AFRICAN AMERICAN: 59 mL/min — AB (ref >60)
Glucose, Bld: 111 mg/dL — ABNORMAL HIGH (ref 65–99)
POTASSIUM: 4.2 mmol/L (ref 3.5–5.1)
SODIUM: 141 mmol/L (ref 135–145)

## 2016-03-25 MED ORDER — CHLORHEXIDINE GLUCONATE CLOTH 2 % EX PADS: 6.0000 | MEDICATED_PAD | Freq: Once | CUTANEOUS | Status: DC

## 2016-03-25 NOTE — Progress Notes (Signed)
Anesthesia Chart Review:  Pt is a 76 year old female scheduled for laparoscopic umbilical hernia repair, insertion of mesh on 03/28/2016 with Judeth Horn, M.D.  [facility] includes: Temporal arteritis, hypothyroidism, uterine cancer. Never smoker. BMI 37. S/p R temporal artery biopsy 10/19/15.  Medications include: Levothyroxine, prednisone  Preoperative labs reviewed.    CXR 10/13/15: No active disease. Degenerative changes mid and lower thoracic spine.  EKG 03/25/16: NSR. ST & T wave abnormality, consider anterior ischemia. - ST & T wave abnormality present on EKG 10/13/15 done prior to temporal artery biopsy, but they are new since tracing prior to that one 04/09/01.   Nuclear stress test 04/17/01: Normal wall motion. EF 75%. Normal exam.   Reviewed case with Dr. Nyoka Cowden. Pt denied CV symptoms at PAT.    If no changes, I anticipate pt can proceed with surgery as scheduled.   Willeen Cass, FNP-BC [facility]/Anesthesiology Phone: [phone] 03/25/2016 2:34 PM

## 2016-03-25 NOTE — Pre-Procedure Instructions (Signed)
[facility]  03/25/2016      [facility] - [place], [place] - [place] [address] [place] [address] Phone: [phone] Fax: [phone]  [facility] [place], [place] - [place] [address] [place] [address] Phone: [phone] Fax: [phone]    Your procedure is scheduled on Monday March 12.  Report to [facility] Admitting at 9:15 A.M.  Call this number if you have problems the morning of surgery:  [phone]   Remember:  Do not eat food or drink liquids after midnight.  Take these medicines the morning of surgery with A SIP OF WATER: gabapentin (neurontin), levothyroxine (synthroid), prednisone (deltasone) 7 days prior to surgery STOP taking any Aspirin, Aleve, Naproxen, Ibuprofen, Motrin, Advil, Goody's, BC's, all herbal medications, fish oil, and all vitamins    Do not wear jewelry, make-up or nail polish.  Do not wear lotions, powders, or perfumes, or deoderant.  Do not shave 48 hours prior to surgery.  Men may shave face and neck.  Do not bring valuables to the hospital.  [facility] is not responsible for any belongings or valuables.  Contacts, dentures or bridgework may not be worn into surgery.  Leave your suitcase in the car.  After surgery it may be brought to your room.  For patients admitted to the hospital, discharge time will be determined by your treatment team.  Patients discharged the day of surgery will not be allowed to drive home.    Special instructions:    [facility]- Preparing For Surgery  Before surgery, you can play an important role. Because skin is not sterile, your skin needs to be as free of germs as possible. You can reduce the number of germs on your skin by washing with CHG (chlorahexidine gluconate) Soap before surgery.  CHG is an antiseptic cleaner which kills germs and bonds with the skin to continue killing germs even after  washing.  Please do not use if you have an allergy to CHG or antibacterial soaps. If your skin becomes reddened/irritated stop using the CHG.  Do not shave (including legs and underarms) for at least 48 hours prior to first CHG shower. It is OK to shave your face.  Please follow these instructions carefully.   1. Shower the NIGHT BEFORE SURGERY and the MORNING OF SURGERY with CHG.   2. If you chose to wash your hair, wash your hair first as usual with your normal shampoo.  3. After you shampoo, rinse your hair and body thoroughly to remove the shampoo.  4. Use CHG as you would any other liquid soap. You can apply CHG directly to the skin and wash gently with a scrungie or a clean washcloth.   5. Apply the CHG Soap to your body ONLY FROM THE NECK DOWN.  Do not use on open wounds or open sores. Avoid contact with your eyes, ears, mouth and genitals (private parts). Wash genitals (private parts) with your normal soap.  6. Wash thoroughly, paying special attention to the area where your surgery will be performed.  7. Thoroughly rinse your body with warm water from the neck down.  8. DO NOT shower/wash with your normal soap after using and rinsing off the CHG Soap.  9. Pat yourself dry with a CLEAN TOWEL.   10. Wear CLEAN PAJAMAS   11. Place CLEAN SHEETS on your bed the night of your first shower and DO NOT SLEEP WITH PETS.  Day of Surgery: Do not apply any deodorants/lotions. Please wear clean clothes to the hospital/surgery center.

## 2016-03-25 NOTE — Progress Notes (Signed)
PCP: [place] No cardiologist, pt states she did see someone and have a stress test in 2003 prior to hysterectomy through [facility] heart and vascular. Pt reports normal stress test.  Last EKG abnormal, anesthesia notified and order for new EKG today.   EKG: 03/25/16 Pt denies chest pain, SOB or signs of infection.

## 2016-03-28 ENCOUNTER — Ambulatory Visit (HOSPITAL_COMMUNITY)
Admission: RE | Admit: 2016-03-28 | Discharge: 2016-03-28 | Disposition: A | Discharge status: Home / Self Care | Payer: Medicare Other | Source: Ambulatory Visit | Attending: General Surgery | Admitting: General Surgery

## 2016-03-28 ENCOUNTER — Ambulatory Visit (HOSPITAL_COMMUNITY): Payer: Medicare Other | Admitting: Emergency Medicine

## 2016-03-28 ENCOUNTER — Ambulatory Visit (HOSPITAL_COMMUNITY): Payer: Medicare Other | Admitting: Anesthesiology

## 2016-03-28 ENCOUNTER — Encounter (HOSPITAL_COMMUNITY): Payer: Self-pay | Admitting: Anesthesiology

## 2016-03-28 ENCOUNTER — Encounter (HOSPITAL_COMMUNITY)
Admission: RE | Disposition: A | Discharge status: Home / Self Care | Payer: Self-pay | Source: Ambulatory Visit | Attending: General Surgery

## 2016-03-28 DIAGNOSIS — Z79899 Other long term (current) drug therapy: Secondary | ICD-10-CM | POA: Insufficient documentation

## 2016-03-28 DIAGNOSIS — K429 Umbilical hernia without obstruction or gangrene: Secondary | ICD-10-CM | POA: Insufficient documentation

## 2016-03-28 DIAGNOSIS — Z7982 Long term (current) use of aspirin: Secondary | ICD-10-CM | POA: Insufficient documentation

## 2016-03-28 DIAGNOSIS — M316 Other giant cell arteritis: Secondary | ICD-10-CM | POA: Diagnosis not present

## 2016-03-28 DIAGNOSIS — K439 Ventral hernia without obstruction or gangrene: Secondary | ICD-10-CM | POA: Diagnosis not present

## 2016-03-28 DIAGNOSIS — I739 Peripheral vascular disease, unspecified: Secondary | ICD-10-CM | POA: Diagnosis not present

## 2016-03-28 DIAGNOSIS — E039 Hypothyroidism, unspecified: Secondary | ICD-10-CM | POA: Diagnosis not present

## 2016-03-28 HISTORY — PX: UMBILICAL HERNIA REPAIR: SHX196

## 2016-03-28 HISTORY — PX: INSERTION OF MESH: SHX5868

## 2016-03-28 SURGERY — REPAIR, HERNIA, UMBILICAL, LAPAROSCOPIC
Anesthesia: General | Site: Abdomen

## 2016-03-28 MED ORDER — LACTATED RINGERS IV SOLN
INTRAVENOUS | Status: DC
  Administered 2016-03-28: 12:00:00 via INTRAVENOUS
  Administered 2016-03-28: 50 mL/h via INTRAVENOUS

## 2016-03-28 MED ORDER — BUPIVACAINE HCL (PF) 0.25 % IJ SOLN
INTRAMUSCULAR | Status: AC
  Filled 2016-03-28: qty 30

## 2016-03-28 MED ORDER — SUGAMMADEX SODIUM 500 MG/5ML IV SOLN
INTRAVENOUS | Status: DC | PRN
  Administered 2016-03-28: 300 mg via INTRAVENOUS

## 2016-03-28 MED ORDER — DEXAMETHASONE SODIUM PHOSPHATE 10 MG/ML IJ SOLN
INTRAMUSCULAR | Status: AC
  Filled 2016-03-28: qty 1

## 2016-03-28 MED ORDER — MIDAZOLAM HCL 5 MG/5ML IJ SOLN
INTRAMUSCULAR | Status: DC | PRN
  Administered 2016-03-28: 1 mg via INTRAVENOUS

## 2016-03-28 MED ORDER — 0.9 % SODIUM CHLORIDE (POUR BTL) OPTIME
TOPICAL | Status: DC | PRN
  Administered 2016-03-28: 1000 mL

## 2016-03-28 MED ORDER — GABAPENTIN 300 MG PO CAPS
300.0000 mg | ORAL_CAPSULE | ORAL | Status: AC
  Administered 2016-03-28: 300 mg via ORAL

## 2016-03-28 MED ORDER — ONDANSETRON HCL 4 MG/2ML IJ SOLN
INTRAMUSCULAR | Status: AC
  Filled 2016-03-28: qty 2

## 2016-03-28 MED ORDER — ROCURONIUM BROMIDE 50 MG/5ML IV SOSY
PREFILLED_SYRINGE | INTRAVENOUS | Status: AC
  Filled 2016-03-28: qty 5

## 2016-03-28 MED ORDER — GABAPENTIN 300 MG PO CAPS
ORAL_CAPSULE | ORAL | Status: DC
Start: 2016-03-28 — End: 2016-03-28
  Filled 2016-03-28: qty 1

## 2016-03-28 MED ORDER — HYDROMORPHONE HCL 1 MG/ML IJ SOLN
0.2500 mg | INTRAMUSCULAR | Status: DC | PRN
  Administered 2016-03-28 (×2): 0.25 mg via INTRAVENOUS

## 2016-03-28 MED ORDER — CIPROFLOXACIN IN D5W 400 MG/200ML IV SOLN
INTRAVENOUS | Status: AC
Start: 2016-03-28 — End: 2016-03-28
  Filled 2016-03-28: qty 200

## 2016-03-28 MED ORDER — FENTANYL CITRATE (PF) 100 MCG/2ML IJ SOLN
INTRAMUSCULAR | Status: AC
  Filled 2016-03-28: qty 4

## 2016-03-28 MED ORDER — ONDANSETRON HCL 4 MG/2ML IJ SOLN
INTRAMUSCULAR | Status: DC | PRN
  Administered 2016-03-28: 4 mg via INTRAVENOUS

## 2016-03-28 MED ORDER — FENTANYL CITRATE (PF) 100 MCG/2ML IJ SOLN
INTRAMUSCULAR | Status: AC
  Filled 2016-03-28: qty 2

## 2016-03-28 MED ORDER — HYDROCODONE-ACETAMINOPHEN 5-325 MG PO TABS
1.0000 | ORAL_TABLET | Freq: Four times a day (QID) | ORAL | 0 refills | Status: DC | PRN
Start: 2016-03-28 — End: 2016-05-13

## 2016-03-28 MED ORDER — CIPROFLOXACIN IN D5W 400 MG/200ML IV SOLN
400.0000 mg | INTRAVENOUS | Status: AC
  Administered 2016-03-28: 400 mg via INTRAVENOUS

## 2016-03-28 MED ORDER — MIDAZOLAM HCL 2 MG/2ML IJ SOLN
INTRAMUSCULAR | Status: AC
  Filled 2016-03-28: qty 2

## 2016-03-28 MED ORDER — SCOPOLAMINE 1 MG/3DAYS TD PT72
MEDICATED_PATCH | TRANSDERMAL | Status: DC | PRN
  Administered 2016-03-28: 1 via TRANSDERMAL

## 2016-03-28 MED ORDER — FENTANYL CITRATE (PF) 100 MCG/2ML IJ SOLN
INTRAMUSCULAR | Status: DC | PRN
  Administered 2016-03-28 (×7): 50 ug via INTRAVENOUS

## 2016-03-28 MED ORDER — DEXAMETHASONE SODIUM PHOSPHATE 10 MG/ML IJ SOLN
INTRAMUSCULAR | Status: DC | PRN
  Administered 2016-03-28: 10 mg via INTRAVENOUS

## 2016-03-28 MED ORDER — ROCURONIUM BROMIDE 100 MG/10ML IV SOLN
INTRAVENOUS | Status: DC | PRN
  Administered 2016-03-28: 50 mg via INTRAVENOUS

## 2016-03-28 MED ORDER — PROMETHAZINE HCL 25 MG/ML IJ SOLN: 6.2500 mg | INTRAMUSCULAR | Status: DC | PRN

## 2016-03-28 MED ORDER — HYDROMORPHONE HCL 1 MG/ML IJ SOLN
INTRAMUSCULAR | Status: AC
  Filled 2016-03-28: qty 0.5

## 2016-03-28 MED ORDER — SCOPOLAMINE 1 MG/3DAYS TD PT72
MEDICATED_PATCH | TRANSDERMAL | Status: AC
Start: 2016-03-28 — End: 2016-03-28
  Filled 2016-03-28: qty 1

## 2016-03-28 MED ORDER — BUPIVACAINE-EPINEPHRINE 0.25% -1:200000 IJ SOLN
INTRAMUSCULAR | Status: DC | PRN
  Administered 2016-03-28: 11 mL

## 2016-03-28 MED ORDER — LIDOCAINE 2% (20 MG/ML) 5 ML SYRINGE
INTRAMUSCULAR | Status: AC
  Filled 2016-03-28: qty 5

## 2016-03-28 MED ORDER — SUGAMMADEX SODIUM 500 MG/5ML IV SOLN
INTRAVENOUS | Status: AC
  Filled 2016-03-28: qty 5

## 2016-03-28 MED ORDER — LIDOCAINE HCL (CARDIAC) 20 MG/ML IV SOLN
INTRAVENOUS | Status: DC | PRN
  Administered 2016-03-28: 100 mg via INTRAVENOUS

## 2016-03-28 MED ORDER — MEPERIDINE HCL 25 MG/ML IJ SOLN: 6.2500 mg | INTRAMUSCULAR | Status: DC | PRN

## 2016-03-28 MED ORDER — LABETALOL HCL 5 MG/ML IV SOLN
INTRAVENOUS | Status: DC | PRN
  Administered 2016-03-28: 10 mg via INTRAVENOUS

## 2016-03-28 MED ORDER — PHENYLEPHRINE 40 MCG/ML (10ML) SYRINGE FOR IV PUSH (FOR BLOOD PRESSURE SUPPORT)
PREFILLED_SYRINGE | INTRAVENOUS | Status: AC
  Filled 2016-03-28: qty 10

## 2016-03-28 MED ORDER — ACETAMINOPHEN 500 MG PO TABS
1000.0000 mg | ORAL_TABLET | ORAL | Status: DC
  Filled 2016-03-28: qty 2

## 2016-03-28 MED ORDER — PHENYLEPHRINE HCL 10 MG/ML IJ SOLN
INTRAMUSCULAR | Status: DC | PRN
  Administered 2016-03-28: 40 ug via INTRAVENOUS
  Administered 2016-03-28 (×2): 80 ug via INTRAVENOUS

## 2016-03-28 MED ORDER — PROPOFOL 10 MG/ML IV BOLUS
INTRAVENOUS | Status: DC | PRN
  Administered 2016-03-28: 120 mg via INTRAVENOUS

## 2016-03-28 MED ORDER — SODIUM CHLORIDE 0.9 % IR SOLN
Status: DC | PRN
  Administered 2016-03-28: 500 mL

## 2016-03-28 MED ORDER — PROPOFOL 10 MG/ML IV BOLUS
INTRAVENOUS | Status: AC
  Filled 2016-03-28: qty 20

## 2016-03-28 SURGICAL SUPPLY — 48 items
ADH SKN CLS APL DERMABOND .7 (GAUZE/BANDAGES/DRESSINGS) ×2
APPLIER CLIP LOGIC TI 5 (MISCELLANEOUS) IMPLANT
APPLIER CLIP ROT 10 11.4 M/L (STAPLE)
APR CLP MED LRG 11.4X10 (STAPLE)
APR CLP MED LRG 33X5 (MISCELLANEOUS)
CANISTER SUCT 3000ML PPV (MISCELLANEOUS) IMPLANT
CHLORAPREP W/TINT 26ML (MISCELLANEOUS) ×3 IMPLANT
CLIP APPLIE ROT 10 11.4 M/L (STAPLE) IMPLANT
COVER SURGICAL LIGHT HANDLE (MISCELLANEOUS) ×3 IMPLANT
DECANTER SPIKE VIAL GLASS SM (MISCELLANEOUS) ×3 IMPLANT
DERMABOND ADVANCED (GAUZE/BANDAGES/DRESSINGS) ×1
DERMABOND ADVANCED .7 DNX12 (GAUZE/BANDAGES/DRESSINGS) ×2 IMPLANT
DEVICE SECURE STRAP 25 ABSORB (INSTRUMENTS) ×3 IMPLANT
DEVICE TROCAR PUNCTURE CLOSURE (ENDOMECHANICALS) ×3 IMPLANT
DRAPE LAPAROSCOPIC ABDOMINAL (DRAPES) ×3 IMPLANT
ELECT REM PT RETURN 9FT ADLT (ELECTROSURGICAL) ×3
ELECTRODE REM PT RTRN 9FT ADLT (ELECTROSURGICAL) ×2 IMPLANT
GLOVE BIOGEL PI IND STRL 7.0 (GLOVE) IMPLANT
GLOVE BIOGEL PI IND STRL 8 (GLOVE) ×2 IMPLANT
GLOVE BIOGEL PI INDICATOR 7.0 (GLOVE) ×2
GLOVE BIOGEL PI INDICATOR 8 (GLOVE) ×1
GLOVE ECLIPSE 7.5 STRL STRAW (GLOVE) ×3 IMPLANT
GLOVE SURG SS PI 6.5 STRL IVOR (GLOVE) ×2 IMPLANT
GOWN STRL REUS W/ TWL LRG LVL3 (GOWN DISPOSABLE) ×6 IMPLANT
GOWN STRL REUS W/TWL LRG LVL3 (GOWN DISPOSABLE) ×9
KIT BASIN OR (CUSTOM PROCEDURE TRAY) ×3 IMPLANT
KIT ROOM TURNOVER OR (KITS) ×3 IMPLANT
MARKER SKIN DUAL TIP RULER LAB (MISCELLANEOUS) ×3 IMPLANT
MESH VENTRALIGHT ST 6IN CRC (Mesh General) ×1 IMPLANT
NDL SPNL 22GX3.5 QUINCKE BK (NEEDLE) ×2 IMPLANT
NEEDLE SPNL 22GX3.5 QUINCKE BK (NEEDLE) ×3 IMPLANT
NS IRRIG 1000ML POUR BTL (IV SOLUTION) ×3 IMPLANT
PAD ARMBOARD 7.5X6 YLW CONV (MISCELLANEOUS) ×6 IMPLANT
SCISSORS LAP 5X35 DISP (ENDOMECHANICALS) IMPLANT
SET IRRIG TUBING LAPAROSCOPIC (IRRIGATION / IRRIGATOR) IMPLANT
SHEARS HARMONIC ACE PLUS 36CM (ENDOMECHANICALS) IMPLANT
SLEEVE ENDOPATH XCEL 5M (ENDOMECHANICALS) ×5 IMPLANT
SUT MNCRL AB 4-0 PS2 18 (SUTURE) ×3 IMPLANT
SUT NOVA NAB GS-21 0 18 T12 DT (SUTURE) ×3 IMPLANT
TOWEL OR 17X24 6PK STRL BLUE (TOWEL DISPOSABLE) ×3 IMPLANT
TOWEL OR 17X26 10 PK STRL BLUE (TOWEL DISPOSABLE) ×3 IMPLANT
TRAY FOLEY W/METER SILVER 16FR (SET/KITS/TRAYS/PACK) ×3 IMPLANT
TRAY LAPAROSCOPIC MC (CUSTOM PROCEDURE TRAY) ×1 IMPLANT
TROCAR XCEL BLUNT TIP 100MML (ENDOMECHANICALS) IMPLANT
TROCAR XCEL NON-BLD 11X100MML (ENDOMECHANICALS) ×1 IMPLANT
TROCAR XCEL NON-BLD 5MMX100MML (ENDOMECHANICALS) ×3 IMPLANT
TUBING INSUFFLATION (TUBING) ×3 IMPLANT
WATER STERILE IRR 1000ML POUR (IV SOLUTION) IMPLANT

## 2016-03-28 NOTE — Anesthesia Postprocedure Evaluation (Signed)
Anesthesia Post Note  Patient: Keenan Bachelor  Procedure(s) Performed: Procedure(s) (LRB): LAPAROSCOPIC UMBILICAL HERNIA (N/A) INSERTION OF MESH (N/A)  Patient location during evaluation: PACU Anesthesia Type: General Level of consciousness: awake and alert Pain management: pain level controlled Vital Signs Assessment: post-procedure vital signs reviewed and stable Respiratory status: spontaneous breathing, nonlabored ventilation, respiratory function stable and patient connected to nasal cannula oxygen Cardiovascular status: blood pressure returned to baseline and stable Postop Assessment: no signs of nausea or vomiting Anesthetic complications: no       Last Vitals:  Vitals:   03/28/16 1600 03/28/16 1611  BP:  (!) 149/88  Pulse: 74 78  Resp: 17 (!) 22  Temp:      Last Pain:  Vitals:   03/28/16 1545  TempSrc:   PainSc: 6                  Dajha Urquilla,JAMES TERRILL

## 2016-03-28 NOTE — Transfer of Care (Signed)
Immediate Anesthesia Transfer of Care Note  Patient: Kim Krueger  Procedure(s) Performed: Procedure(s): LAPAROSCOPIC UMBILICAL HERNIA (N/A) INSERTION OF MESH (N/A)  Patient Location: PACU  Anesthesia Type:General  Level of Consciousness: awake, alert , oriented and patient cooperative  Airway & Oxygen Therapy: Patient Spontanous Breathing and Patient connected to nasal cannula oxygen  Post-op Assessment: Report given to RN and Post -op Vital signs reviewed and stable  Post vital signs: Reviewed and stable  Last Vitals:  Vitals:   03/28/16 0859 03/28/16 1442  BP: (!) 170/82   Pulse: 84   Resp: 20   Temp: 37 C 36.4 C    Last Pain:  Vitals:   03/28/16 1442  TempSrc:   PainSc: (P) Asleep      Patients Stated Pain Goal: 3 (49/17/91 5056)  Complications: No apparent anesthesia complications

## 2016-03-28 NOTE — Discharge Instructions (Signed)
Laparoscopic Ventral Hernia Repair, Care After This sheet gives you information about how to care for yourself after your procedure. Your health care provider may also give you more specific instructions. If you have problems or questions, contact your health care provider. What can I expect after the procedure? After the procedure, it is common to have:  Pain, discomfort, or soreness. Follow these instructions at home: Incision care   Follow instructions from your health care provider about how to take care of your incision. Make sure you:  Wash your hands with soap and water before you change your bandage (dressing) or before you touch your abdomen. If soap and water are not available, use hand sanitizer.  Change your dressing as told by your health care provider.  Leave stitches (sutures), skin glue, or adhesive strips in place. These skin closures may need to stay in place for 2 weeks or longer. If adhesive strip edges start to loosen and curl up, you may trim the loose edges. Do not remove adhesive strips completely unless your health care provider tells you to do that.  Check your incision area every day for signs of infection. Check for:  Redness, swelling, or pain.  Fluid or blood.  Warmth.  Pus or a bad smell. Bathing   Do not take baths, swim, or use a hot tub until your health care provider approves. Ask your health care provider if you can take showers. You may only be allowed to take sponge baths for bathing--You may shower and pat wounds dry.  Keep your bandage (dressing) dry until your health care provider says it can be removed. Activity   Do not lift anything that is heavier than 10 lb (4.5 kg) until your health care provider approves.  Do not drive or use heavy machinery while taking prescription pain medicine. Ask your health care provider when it is safe for you to drive or use heavy machinery.  Do not drive for 24 hours if you were given a medicine to help  you relax (sedative) during your procedure.  Rest as told by your health care provider. You may return to your normal activities when your health care provider approves. General instructions   Take over-the-counter and prescription medicines only as told by your health care provider.  To prevent or treat constipation while you are taking prescription pain medicine, your health care provider may recommend that you:  Take over-the-counter or prescription medicines.  Eat foods that are high in fiber, such as fresh fruits and vegetables, whole grains, and beans.  Limit foods that are high in fat and processed sugars, such as fried and sweet foods.  Drink enough fluid to keep your urine clear or pale yellow.  Hold a pillow over your abdomen when you cough or sneeze. This helps with pain.  Keep all follow-up visits as told by your health care provider. This is important. Contact a health care provider if:  You have:  A fever or chills.  Redness, swelling, or pain around your incision.  Fluid or blood coming from your incision.  Pus or a bad smell coming from your incision.  Pain that gets worse or does not get better with medicine.  Nausea or vomiting.  A cough.  Shortness of breath.  Your incision feels warm to the touch.  You have not had a bowel movement in three days.  You are not able to urinate. Get help right away if:  You have severe pain in your abdomen.  You  have persistent nausea and vomiting.  You have redness, warmth, or pain in your leg.  You have chest pain.  You have trouble breathing. Summary  After this procedure, it is common to have pain, discomfort, or soreness.  Follow instructions from your health care provider about how to take care of your incision.  Check your incision area every day for signs of infection. Report any signs of infection to your health care provider.  Keep all follow-up visits as told by your health care provider.  This is important. This information is not intended to replace advice given to you by your health care provider. Make sure you discuss any questions you have with your health care provider. Document Released: 12/21/2011 Document Revised: 08/26/2015 Document Reviewed: 08/26/2015 Elsevier Interactive Patient Education  2017 [employer].

## 2016-03-28 NOTE — Anesthesia Preprocedure Evaluation (Addendum)
Anesthesia Evaluation  Patient identified by MRN, date of birth, ID band Patient awake    Reviewed: Allergy & Precautions, NPO status , Patient's Chart, lab work & pertinent test results  Airway Mallampati: III  TM Distance: >3 FB Neck ROM: Full    Dental no notable dental hx.    Pulmonary neg pulmonary ROS,    Pulmonary exam normal        Cardiovascular + Peripheral Vascular Disease  Normal cardiovascular exam Rhythm:Regular Rate:Normal     Neuro/Psych negative neurological ROS  negative psych ROS   GI/Hepatic negative GI ROS, Neg liver ROS,   Endo/Other  Hypothyroidism   Renal/GU negative Renal ROS     Musculoskeletal   Abdominal (+) + obese,   Peds  Hematology   Anesthesia Other Findings Temporal arteritis  Reproductive/Obstetrics                             Anesthesia Physical  Anesthesia Plan  ASA: II  Anesthesia Plan: General   Post-op Pain Management:    Induction: Intravenous  Airway Management Planned: Oral ETT  Additional Equipment:   Intra-op Plan:   Post-operative Plan: Extubation in OR  Informed Consent: I have reviewed the patients History and Physical, chart, labs and discussed the procedure including the risks, benefits and alternatives for the proposed anesthesia with the patient or authorized representative who has indicated his/her understanding and acceptance.   Dental advisory given  Plan Discussed with: CRNA  Anesthesia Plan Comments:         Anesthesia Quick Evaluation

## 2016-03-28 NOTE — Anesthesia Procedure Notes (Signed)
Procedure Name: Intubation Date/Time: 03/28/2016 12:54 PM Performed by: Rebekah Chesterfield L Pre-anesthesia Checklist: Patient identified, Emergency Drugs available, Suction available and Patient being monitored Patient Re-evaluated:Patient Re-evaluated prior to inductionOxygen Delivery Method: Circle System Utilized Preoxygenation: Pre-oxygenation with 100% oxygen Intubation Type: IV induction Ventilation: Mask ventilation without difficulty Laryngoscope Size: Mac and 3 Grade View: Grade II Tube type: Oral Tube size: 7.5 mm Number of attempts: 1 Airway Equipment and Method: Stylet Placement Confirmation: ETT inserted through vocal cords under direct vision,  positive ETCO2 and breath sounds checked- equal and bilateral Secured at: 21 cm Tube secured with: Tape Dental Injury: Teeth and Oropharynx as per pre-operative assessment

## 2016-03-28 NOTE — H&P (Signed)
Kim Krueger 03/15/2016 9:14 AM Location: [facility] Patient #: [id number] DOB: [birth year] Married / Language: English / Race: White Female   The patient is a 76 year old female.  Allergies Malachy Moan, RMA; 03/15/2016 9:15 AM) Ceftin *CEPHALOSPORINS*  Penicillin G Pot in Dextrose *PENICILLINS*  Sulfa 10 *OPHTHALMIC AGENTS*   Medication History Malachy Moan, RMA; 03/15/2016 9:15 AM) Lebron Quam (5-325MG  Tablet, 1-2 Tablet Oral every four hours, as needed for pain, Taken starting 10/19/2015) Active. Levothyroxine Sodium (88MCG Tablet, Oral) Active. Vitamin B 12 (Oral) Specific strength unknown - Active. Vitamin D3 (2000UNIT Capsule, Oral) Active. Aspirin (81MG  Tablet, Oral) Active. PredniSONE (20MG  Tablet, Oral) Active. Gabapentin (100MG  Capsule, Oral) Active. Medications Reconciled  Vitals Malachy Moan RMA; 03/15/2016 9:15 AM) 03/15/2016 9:15 AM Weight: 206 lb Height: 63in Body Surface Area: 1.96 m Body Mass Index: 36.49 kg/m  Temp.: 98.89F  Pulse: 74 (Regular)  BP: 160/90 (Sitting, Left Arm, Standard) BP today  170/82 P today  84  Physical Exam (Svara Twyman O. Leelynn Whetsel MD; 03/15/2016 9:33 AM) Abdomen Inspection Hernias - Umbilical hernia - Reducible(Reducible umbilical hernia, comes out inferiorly, completely reducible but tender). No change in hernia today.  No incarceration.   Palpation/Percussion Tenderness - Periumbilical. Auscultation Auscultation of the abdomen reveals - Bowel sounds normal.  No changes in examination.  Assessment & Plan Jeneen Rinks O. Sheryle Vice MD; 4/66/5993 5:70 AM) UMBILICAL HERNIA WITHOUT OBSTRUCTION OR GANGRENE (K42.9) Impression: Completely reducible umbilical hernia, tender, may be from previous laparoscopic procedure. Will need laparoscopic repair, but could be done open also.  Will schedule surgery at [facility] Current Plans:  Laparoscopic umbilical hernia repair with mesh.  Kathryne Eriksson. Dahlia Bailiff, MD,  [place] [phone] [facility]

## 2016-03-28 NOTE — Op Note (Signed)
OPERATIVE REPORT  DATE OF OPERATION: 03/28/2016  PATIENT:  Kim Krueger  76 y.o. female  PRE-OPERATIVE DIAGNOSIS:  Symptomatic reducible umbilical hernia  POST-OPERATIVE DIAGNOSIS:  Symptomatic reducible umbilical hernia  INDICATION(S) FOR OPERATION:  Symptomatic umbilical/ventral hernia  FINDINGS:  5 cm umbilical hernia at site of previous laparoscopy  PROCEDURE:  Procedure(s): LAPAROSCOPIC UMBILICAL HERNIA INSERTION OF MESH  SURGEON:  Surgeon(s): Judeth Horn, MD  ASSISTANT: None  ANESTHESIA:   general  COMPLICATIONS:  None  EBL: <20 ml  BLOOD ADMINISTERED: none  DRAINS: none   SPECIMEN:  No Specimen  COUNTS CORRECT:  YES  PROCEDURE DETAILS: The patient was taken to the operating room and placed on the table in the supine position. After an adequate general endotracheal anesthetic was administered, she was prepped and draped in the usual sterile manner exposing her abdomen.  A proper timeout was performed identifying the patient and the procedure to be performed. A left upper quadrant transverse incision was made using a #15 blade and taken down into the subcutaneous tissue. We then used a 10 mm trocar and cannula were then inserted laparoscope and attached camera and light source and using the Optiview cannula into the peritoneal cavity safely with minimal difficulty. We then insufflated carbon dioxide gas into the peritoneal cavity up to a maximal intra-abdominal pressure of 15 mmHg.  We subsequently passed a left low quadrant 5 mm cannula, a right lower quadrant 5 mm cannula, and then later in the case a right upper quadrant 5 mm cannula.  We inspected the hernia defect which had a small amount of preperitoneal fat and omentum incarcerated within it. This is removed without difficulty. Some preperitoneal fatty appendices were removed from the edges of the hernia sac. These are removed from the perineal cavity.  The hernia defect itself measured approximately 5 cm  in diameter. We marked the edges of the hernia sac using a spinal needle and a marking pen. Subsequently measured 5 cm from the marking coming across with a total dimensions are 14 x 15 cm. We elected to use a coated he is of ventral light mesh measuring 15 cm circular.  The mesh was soaked in antibiotic solution. 0 Novafil sutures were placed on the mesh and equally space 4 quadrants. We then rolled the mesh and passed it through the large cannula into the peritoneal cavity. Using a suture retrieval device  All 8 sutures up to the skin and attached the mesh to the anterior abdominal wall. This was with a coated side exposed. Immediately.  We subsequently used a secure strap tacker in order to attach the mesh circumferentially after we tied down the Prolene sutures.  Once the mesh was in place it appeared to lay flat and cover the entire defect. All counts were correct at that point.  We allowed all gas to escape from the cannulas and removed all cannulas. The only skin site to be closed with a 4-0 Monocryl was 1 in left upper quadrant for the 11 mm cannula.  0.25% Marcaine without epinephrine was injected at all sites. All the sites were closed with Dermabond Steri-Strips and somewhat closer Tegaderm also. An abdominal binder was placed. Again all needle counts, sponge count, and instrument counts were correct  PATIENT DISPOSITION:  PACU - hemodynamically stable.   [name] 3/12/20182:35 PM

## 2016-03-29 ENCOUNTER — Encounter (HOSPITAL_COMMUNITY): Payer: Self-pay | Admitting: General Surgery

## 2016-04-17 ENCOUNTER — Other Ambulatory Visit: Payer: Self-pay | Admitting: Neurology

## 2016-05-09 DIAGNOSIS — L821 Other seborrheic keratosis: Secondary | ICD-10-CM | POA: Diagnosis not present

## 2016-05-09 DIAGNOSIS — L570 Actinic keratosis: Secondary | ICD-10-CM | POA: Diagnosis not present

## 2016-05-09 DIAGNOSIS — Z85828 Personal history of other malignant neoplasm of skin: Secondary | ICD-10-CM | POA: Diagnosis not present

## 2016-05-13 ENCOUNTER — Ambulatory Visit (INDEPENDENT_AMBULATORY_CARE_PROVIDER_SITE_OTHER): Payer: Medicare Other | Admitting: Neurology

## 2016-05-13 ENCOUNTER — Encounter: Payer: Self-pay | Admitting: Neurology

## 2016-05-13 VITALS — BP 142/70 | HR 81 | Temp 98.3°F | Ht 62.5 in | Wt 203.0 lb

## 2016-05-13 DIAGNOSIS — M316 Other giant cell arteritis: Secondary | ICD-10-CM | POA: Diagnosis not present

## 2016-05-13 MED ORDER — GABAPENTIN 100 MG PO CAPS: 100.0000 mg | ORAL_CAPSULE | Freq: Every day | ORAL | 1 refills | Status: DC

## 2016-05-13 MED ORDER — PREDNISONE 5 MG PO TABS: ORAL_TABLET | ORAL | 1 refills | Status: DC

## 2016-05-13 MED ORDER — PREDNISONE 1 MG PO TABS: ORAL_TABLET | ORAL | 0 refills | Status: DC

## 2016-05-13 NOTE — Progress Notes (Signed)
NEUROLOGY FOLLOW UP OFFICE NOTE  Kim Krueger [id number]  HISTORY OF PRESENT ILLNESS: I had the pleasure of seeing Kim Krueger in follow-up in the neurology clinic on 05/13/2016.  The patient was last seen 3 months ago for right temporal arteritis. She underwent temporal artery biopsy which confirmed the diagnosis. She is on a tapering schedule of Prednisone, currently on '7mg'$  daily. She is reducing dose by '1mg'$  at the beginning of each month. Her last CRP on 02/18/16 was normal 4.4. She cut down gabapentin to 1 capsule of '100mg'$  at night and denies any further headaches. She notices occasional blurred and double vision when she wakes up, usually resolving when she gets up. She has had less energy. No vision loss, no dizziness, no focal numbness/tingling/weakness. No falls.  HPI 10/05/2015: This is a 76 yo RH woman with a history of hypothyroidism, who presented with new onset headaches. She started having pain around her right ear and localized pain on the right temporal region . She describes the pain as a soreness and ache radiating down her right cheek. She went to her PCP office and was told ear exam was normal, given Levofloxacin and a nasal spray. She saw her dentist a few days after and was told her bite was off and measured her for a mouth guard which she got the same day. She wore this for 2 nights, then woke up Wednesday morning with the right inside of her cheek feeling mutilated from the mouth guard. She was in a lot of pain and taking 3 tablets of Motrin every 4 hours. That evening, she saw 2 black rectangles blocking the lower part of her vision. She went to bed and was woken up by shooting pain on the right side, no further vision symptoms. She went to the ER on 10/01/15 due to continued pain, reporting taking 28 Motrin over a 24-hour period. She was given a dose of Fioricet which helped for a time. ER records were reviewed, ESR was elevated at 90, CRP elevated at 18.6. CBC and BMP were  unremarkable except for mildly elevated creatinine of 1.07. I personally reviewed head CT without contrast which did not show any acute changes, there was age-related atrophy and chronic microvascular disease. She was discharged home on Prednisone '80mg'$  daily and instructions to take Motrin every 4 hours. Pain would go down to a 0/10, but after 4 hours, go back up to 8/10. With starting prednisone, she can go 6-8 hours without taking Motrin. Tylenol did not help. She has lost 10 lbs since pain started due to mouth issues, she feels like there are ulcers in her mouth and only takes soft foods and yoghurt. She denies any ear pain but feels like there is draining inside her right ear, no hearing loss or tinnitus. She denies any conjunctival injection or tearing, no focal numbness/tingling/weakness. No neck/back pain. She denies any prior similar symptoms, no prior history of headaches.   PAST MEDICAL HISTORY: Past Medical History:  Diagnosis Date  . Cancer [facility])    uterine cancer, had hysterectomy  . Hypothyroidism   . Temporal arteritis ([place])   . Thyroid disease   . Urge incontinence     MEDICATIONS:  Outpatient Encounter Prescriptions as of 12/07/2015  Medication Sig  . Cholecalciferol (VITAMIN D) 2000 units CAPS Take by mouth.  . Cyanocobalamin (VITAMIN B-12 CR) 1500 MCG TBCR Take by mouth.  . gabapentin (NEURONTIN) 100 MG capsule Take 1 capsule (100 mg total) by mouth at bedtime.  Then may increase to 1 capsule BID.  Marland Kitchen levothyroxine (SYNTHROID, LEVOTHROID) 88 MCG tablet Take 88 mcg by mouth daily.  . [DISCONTINUED] traMADol (ULTRAM) 50 MG tablet Take 1-2 tablets (50-100 mg total) by mouth every 6 (six) hours as needed for moderate pain or severe pain.  Prednisone '7mg'$  daily No facility-administered encounter medications on file as of 12/07/2015.      ALLERGIES: Allergies  Allergen Reactions  . Ceftin [Cefuroxime Axetil] Nausea Only    Caused sever yeast infection  . Acetaminophen      "extra strength makes the pain worse"   . Hydrocodone   . Sulfa Antibiotics Other (See Comments)    Unknown childhood reaction after surgery   . Tramadol Other (See Comments)    Cause pain to worsen   . Penicillins Rash    Has patient had a PCN reaction causing immediate rash, facial/tongue/throat swelling, SOB or lightheadedness with hypotension: Yes Has patient had a PCN reaction causing severe rash involving mucus membranes or skin necrosis: No Has patient had a PCN reaction that required hospitalization No Has patient had a PCN reaction occurring within the last 10 years: No If all of the above answers are "NO", then may proceed with Cephalosporin use.     FAMILY HISTORY: No family history on file.  SOCIAL HISTORY: Social History   Social History  . Marital status: Married    Spouse name: N/A  . Number of children: N/A  . Years of education: N/A   Occupational History  . Not on file.   Social History Main Topics  . Smoking status: Never Smoker  . Smokeless tobacco: Never Used  . Alcohol use No  . Drug use: No  . Sexual activity: Not on file   Other Topics Concern  . Not on file   Social History Narrative  . No narrative on file    REVIEW OF SYSTEMS: Constitutional: No fevers, chills, or sweats, no generalized fatigue, change in appetite Eyes: as above Ear, nose and throat: No hearing loss, ear pain, nasal congestion, sore throat Cardiovascular: No chest pain, palpitations Respiratory:  No shortness of breath at rest or with exertion, wheezes GastrointestinaI: No nausea, vomiting, diarrhea, abdominal pain, fecal incontinence Genitourinary:  No dysuria, urinary retention or frequency Musculoskeletal:  No neck pain, back pain Integumentary: No rash, pruritus, skin lesions Neurological: as above Psychiatric: No depression, insomnia, anxiety Endocrine: No palpitations, fatigue, diaphoresis, mood swings, change in appetite, change in weight, increased  thirst Hematologic/Lymphatic:  No anemia, purpura, petechiae. Allergic/Immunologic: no itchy/runny eyes, nasal congestion, recent allergic reactions, rashes  PHYSICAL EXAM: Vitals:   05/13/16 1408  BP: (!) 142/70  Pulse: 81  Temp: 98.3 F (36.8 C)   General: No acute distress Head:  Normocephalic, well-healing scar in right temporal region Neck: supple, no paraspinal tenderness, full range of motion Heart:  Regular rate and rhythm Lungs:  Clear to auscultation bilaterally Back: No paraspinal tenderness Skin/Extremities: No rash, no edema Neurological Exam: alert and oriented to person, place, and time. No aphasia or dysarthria. Fund of knowledge is appropriate.  Recent and remote memory are intact.  Attention and concentration are normal.    Able to name objects and repeat phrases. Cranial nerves: Pupils equal, round, reactive to light.  Fundoscopic exam unremarkable, no papilledema. Extraocular movements intact with no nystagmus. Visual fields full. Facial sensation intact. No facial asymmetry. Tongue, uvula, palate midline.  Motor: Bulk and tone normal, muscle strength 5/5 throughout with no pronator drift.  Sensation to light  touch intact.  No extinction to double simultaneous stimulation.  Deep tendon reflexes 2+ throughout, toes downgoing.  Finger to nose testing intact.  Gait narrow-based and steady, able to tandem walk adequately.  Romberg negative.  IMPRESSION: This is a 76 yo RH woman with a history of hypothyroidism, who presented with new onset right temporal pain also affecting her right ear and right jaw region. Temporal artery biopsy confirmed temporal arteritis. She continues to taper prednisone, currently on '7mg'$  daily. We are doing a slow taper by '1mg'$  every month. Headaches have resolved, she is on low dose gabapentin '100mg'$  qhs with no side effects. Continue aspirin '81mg'$  daily. She will follow-up in 4 months and knows to call for any changes.   Thank you for allowing me to  participate in her care.  Please do not hesitate to call for any questions or concerns.  The duration of this appointment visit was 25 minutes of face-to-face time with the patient.  Greater than 50% of this time was spent in counseling, explanation of diagnosis, planning of further management, and coordination of care.   Ellouise Newer, M.D.   CC: Dr. Osborne Casco

## 2016-05-13 NOTE — Patient Instructions (Signed)
1. Continue with Prednisone taper as instructed:  May - take 6mg  daily  June - take 5mg  daily July - take 4mg  daily Aug - take 3mg  daily Sept - take 2 mg daily Oct - take 1mg  daily then stop at end of month  2. Continue Neurontin 100mg : take 1 capsule at night  3. Follow-up in 4 months, call for any changes

## 2016-06-17 NOTE — Addendum Note (Signed)
Addendum  created 06/17/16 1220 by Rica Koyanagi, MD   Sign clinical note

## 2016-06-17 NOTE — Anesthesia Postprocedure Evaluation (Signed)
Anesthesia Post Note  Patient: Kim Krueger  Procedure(s) Performed: Procedure(s) (LRB): LAPAROSCOPIC UMBILICAL HERNIA (N/A) INSERTION OF MESH (N/A)     Anesthesia Post Evaluation  Last Vitals:  Vitals:   03/28/16 1626 03/28/16 1630  BP: (!) 156/80   Pulse: 77 76  Resp: 18 (!) 21  Temp:      Last Pain:  Vitals:   03/28/16 1545  TempSrc:   PainSc: 6                  Marchello Rothgeb,JAMES TERRILL

## 2016-07-15 ENCOUNTER — Other Ambulatory Visit: Payer: Self-pay

## 2016-07-15 MED ORDER — PREDNISONE 1 MG PO TABS: ORAL_TABLET | ORAL | 1 refills | Status: DC

## 2016-07-15 NOTE — Telephone Encounter (Signed)
Rx sent to pt listed preferred pharmacy  Prednisone 1mg  tablet #120 1 refill Sig - take 4 tabs daily during month of July, reduce to 3 tabs daily August 1st.

## 2016-09-16 ENCOUNTER — Ambulatory Visit (INDEPENDENT_AMBULATORY_CARE_PROVIDER_SITE_OTHER): Payer: Medicare Other | Admitting: Neurology

## 2016-09-16 ENCOUNTER — Encounter: Payer: Self-pay | Admitting: Neurology

## 2016-09-16 VITALS — BP 130/84 | HR 76 | Ht 63.0 in | Wt 208.0 lb

## 2016-09-16 DIAGNOSIS — M316 Other giant cell arteritis: Secondary | ICD-10-CM | POA: Diagnosis not present

## 2016-09-16 MED ORDER — GABAPENTIN 100 MG PO CAPS: 100.0000 mg | ORAL_CAPSULE | Freq: Every day | ORAL | 1 refills | Status: DC

## 2016-09-16 NOTE — Patient Instructions (Signed)
1. Continue with prednisone taper until you are done by the end of October 2. Continue gabapentin 100mg  until January, then see how you are feeling. If no significant headaches, start tapering down the gabapentin to 1 capsule every other day for a week, then stop. Call our office for any problems 3. Follow-up in 6 months, call for any changes

## 2016-09-16 NOTE — Progress Notes (Signed)
NEUROLOGY FOLLOW UP OFFICE NOTE  Kim Krueger [id number]  HISTORY OF PRESENT ILLNESS: I had the pleasure of seeing Kim Krueger in follow-up in the neurology clinic on 09/16/2016.  The patient was last seen 4 months ago for right temporal arteritis. She underwent temporal artery biopsy which confirmed the diagnosis. She is on a tapering schedule of Prednisone, currently on '3mg'$  daily. She will reduce dose to '2mg'$  daily tomorrow, with plans to taper off completely by the end of October. She continues to do well with only occasional little pressure or hint of pain that does not last long over the frontal region, usually occurring when she is more active. She has found that Sudafed seems to help, and they may also relate to changes in barometric pressure. She is taking gabapentin '100mg'$  daily without side effects. She reports vision is good. She feels 74% herself now. She denies any focal numbness/tingling/weakness, no falls. She still feels tired occasionally. She has been having some urinary urgency at night. She reports bilateral leg swelling that improves with elevation.   HPI 10/05/2015: This is a 76 yo RH woman with a history of hypothyroidism, who presented with new onset headaches. She started having pain around her right ear and localized pain on the right temporal region . She describes the pain as a soreness and ache radiating down her right cheek. She went to her PCP office and was told ear exam was normal, given Levofloxacin and a nasal spray. She saw her dentist a few days after and was told her bite was off and measured her for a mouth guard which she got the same day. She wore this for 2 nights, then woke up Wednesday morning with the right inside of her cheek feeling mutilated from the mouth guard. She was in a lot of pain and taking 3 tablets of Motrin every 4 hours. That evening, she saw 2 black rectangles blocking the lower part of her vision. She went to bed and was woken up by shooting  pain on the right side, no further vision symptoms. She went to the ER on 10/01/15 due to continued pain, reporting taking 28 Motrin over a 24-hour period. She was given a dose of Fioricet which helped for a time. ER records were reviewed, ESR was elevated at 90, CRP elevated at 18.6. CBC and BMP were unremarkable except for mildly elevated creatinine of 1.07. I personally reviewed head CT without contrast which did not show any acute changes, there was age-related atrophy and chronic microvascular disease. She was discharged home on Prednisone '80mg'$  daily and instructions to take Motrin every 4 hours. Pain would go down to a 0/10, but after 4 hours, go back up to 8/10. With starting prednisone, she can go 6-8 hours without taking Motrin. Tylenol did not help. She has lost 10 lbs since pain started due to mouth issues, she feels like there are ulcers in her mouth and only takes soft foods and yoghurt. She denies any ear pain but feels like there is draining inside her right ear, no hearing loss or tinnitus. She denies any conjunctival injection or tearing, no focal numbness/tingling/weakness. No neck/back pain. She denies any prior similar symptoms, no prior history of headaches.   PAST MEDICAL HISTORY: Past Medical History:  Diagnosis Date  . Cancer [facility])    uterine cancer, had hysterectomy  . Hypothyroidism   . Temporal arteritis ([place])   . Thyroid disease   . Urge incontinence     MEDICATIONS:  Outpatient Encounter Prescriptions as of 09/16/2016  Medication Sig  . Cholecalciferol (VITAMIN D) 2000 units CAPS Take 1 capsule by mouth daily.   . Cyanocobalamin (VITAMIN B-12 CR) 1500 MCG TBCR Take 1 tablet by mouth daily.   Marland Kitchen gabapentin (NEURONTIN) 100 MG capsule Take 1 capsule (100 mg total) by mouth at bedtime. Take 1 tablet at bedtime. Monitor headaches.  . levothyroxine (SYNTHROID, LEVOTHROID) 88 MCG tablet Take 88 mcg by mouth daily.  . predniSONE (DELTASONE) 1 MG tablet Take 3 tablets daily  .  [DISCONTINUED] gabapentin (NEURONTIN) 100 MG capsule Take 1 capsule (100 mg total) by mouth at bedtime. Take 1 tablet at bedtime. Monitor headaches.   No facility-administered encounter medications on file as of 09/16/2016.       ALLERGIES: Allergies  Allergen Reactions  . Ceftin [Cefuroxime Axetil] Nausea Only    Caused sever yeast infection  . Acetaminophen     "extra strength makes the pain worse"   . Hydrocodone   . Sulfa Antibiotics Other (See Comments)    Unknown childhood reaction after surgery   . Tramadol Other (See Comments)    Cause pain to worsen   . Penicillins Rash    Has patient had a PCN reaction causing immediate rash, facial/tongue/throat swelling, SOB or lightheadedness with hypotension: Yes Has patient had a PCN reaction causing severe rash involving mucus membranes or skin necrosis: No Has patient had a PCN reaction that required hospitalization No Has patient had a PCN reaction occurring within the last 10 years: No If all of the above answers are "NO", then may proceed with Cephalosporin use.     FAMILY HISTORY: No family history on file.  SOCIAL HISTORY: Social History   Social History  . Marital status: Married    Spouse name: N/A  . Number of children: N/A  . Years of education: N/A   Occupational History  . Not on file.   Social History Main Topics  . Smoking status: Never Smoker  . Smokeless tobacco: Never Used  . Alcohol use No  . Drug use: No  . Sexual activity: Not on file   Other Topics Concern  . Not on file   Social History Narrative  . No narrative on file    REVIEW OF SYSTEMS: Constitutional: No fevers, chills, or sweats, no generalized fatigue, change in appetite Eyes: as above Ear, nose and throat: No hearing loss, ear pain, nasal congestion, sore throat Cardiovascular: No chest pain, palpitations Respiratory:  No shortness of breath at rest or with exertion, wheezes GastrointestinaI: No nausea, vomiting, diarrhea,  abdominal pain, fecal incontinence Genitourinary:  No dysuria, urinary retention or frequency Musculoskeletal:  No neck pain, back pain Integumentary: No rash, pruritus, skin lesions Neurological: as above Psychiatric: No depression, insomnia, anxiety Endocrine: No palpitations, fatigue, diaphoresis, mood swings, change in appetite, change in weight, increased thirst Hematologic/Lymphatic:  No anemia, purpura, petechiae. Allergic/Immunologic: no itchy/runny eyes, nasal congestion, recent allergic reactions, rashes  PHYSICAL EXAM: Vitals:   09/16/16 1602  BP: 130/84  Pulse: 76  SpO2: 99%   General: No acute distress Head:  Normocephalic, well-healing scar in right temporal region Neck: supple, no paraspinal tenderness, full range of motion Heart:  Regular rate and rhythm Lungs:  Clear to auscultation bilaterally Back: No paraspinal tenderness Skin/Extremities: No rash, no edema Neurological Exam: alert and oriented to person, place, and time. No aphasia or dysarthria. Fund of knowledge is appropriate.  Recent and remote memory are intact.  Attention and concentration are normal.  Able to name objects and repeat phrases. Cranial nerves: Pupils equal, round, reactive to light.  Fundoscopic exam unremarkable, no papilledema. Extraocular movements intact with no nystagmus. Visual fields full. Facial sensation intact. No facial asymmetry. Tongue, uvula, palate midline.  Motor: Bulk and tone normal, muscle strength 5/5 throughout with no pronator drift.  Sensation to light touch intact.  No extinction to double simultaneous stimulation.  Deep tendon reflexes 2+ throughout, toes downgoing.  Finger to nose testing intact.  Gait narrow-based and steady, able to tandem walk adequately.  Romberg negative.  IMPRESSION: This is a 76 yo RH woman with a history of hypothyroidism, who presented with new onset right temporal pain also affecting her right ear and right jaw region. Temporal artery biopsy  confirmed temporal arteritis. She continues to taper prednisone, currently on '3mg'$  daily, with plans to fully taper off by the end of October. She is reporting occasional mild head pains/pressure that appear to improve with Sudafed. We discussed continuing gabapentin for another 3 months after stopping prednisone and monitor headaches. If doing well, she will taper off the gabapentin. Continue aspirin '81mg'$  daily. She will follow-up in 6 months and knows to call for any changes.   Thank you for allowing me to participate in her care.  Please do not hesitate to call for any questions or concerns.  The duration of this appointment visit was 25 minutes of face-to-face time with the patient.  Greater than 50% of this time was spent in counseling, explanation of diagnosis, planning of further management, and coordination of care.   Ellouise Newer, M.D.   CC: Dr. Osborne Casco

## 2016-09-23 ENCOUNTER — Encounter: Payer: Self-pay | Admitting: Neurology

## 2016-10-12 ENCOUNTER — Telehealth: Payer: Self-pay | Admitting: Neurology

## 2016-10-12 NOTE — Telephone Encounter (Signed)
Kim Krueger called and Lmom to have Meagaen please call her She did not say the reason. Thanks

## 2016-10-13 ENCOUNTER — Other Ambulatory Visit: Payer: Self-pay | Admitting: Neurology

## 2016-10-13 NOTE — Telephone Encounter (Signed)
Pt left a message she needs a refill of Prednisone called in

## 2016-11-29 DIAGNOSIS — R82998 Other abnormal findings in urine: Secondary | ICD-10-CM | POA: Diagnosis not present

## 2016-11-29 DIAGNOSIS — M859 Disorder of bone density and structure, unspecified: Secondary | ICD-10-CM | POA: Diagnosis not present

## 2016-11-29 DIAGNOSIS — E7800 Pure hypercholesterolemia, unspecified: Secondary | ICD-10-CM | POA: Diagnosis not present

## 2016-12-06 DIAGNOSIS — D508 Other iron deficiency anemias: Secondary | ICD-10-CM | POA: Diagnosis not present

## 2016-12-06 DIAGNOSIS — M316 Other giant cell arteritis: Secondary | ICD-10-CM | POA: Diagnosis not present

## 2016-12-06 DIAGNOSIS — Z0000 Encounter for general adult medical examination without abnormal findings: Secondary | ICD-10-CM | POA: Diagnosis not present

## 2016-12-06 DIAGNOSIS — N3941 Urge incontinence: Secondary | ICD-10-CM | POA: Diagnosis not present

## 2016-12-13 DIAGNOSIS — Z1212 Encounter for screening for malignant neoplasm of rectum: Secondary | ICD-10-CM | POA: Diagnosis not present

## 2016-12-19 DIAGNOSIS — Z1231 Encounter for screening mammogram for malignant neoplasm of breast: Secondary | ICD-10-CM | POA: Diagnosis not present

## 2016-12-19 DIAGNOSIS — M8589 Other specified disorders of bone density and structure, multiple sites: Secondary | ICD-10-CM | POA: Diagnosis not present

## 2017-01-04 DIAGNOSIS — H40013 Open angle with borderline findings, low risk, bilateral: Secondary | ICD-10-CM | POA: Diagnosis not present

## 2017-01-04 DIAGNOSIS — H2513 Age-related nuclear cataract, bilateral: Secondary | ICD-10-CM | POA: Diagnosis not present

## 2017-01-04 DIAGNOSIS — M316 Other giant cell arteritis: Secondary | ICD-10-CM | POA: Diagnosis not present

## 2017-02-01 DIAGNOSIS — Z6836 Body mass index (BMI) 36.0-36.9, adult: Secondary | ICD-10-CM | POA: Diagnosis not present

## 2017-02-01 DIAGNOSIS — Z01419 Encounter for gynecological examination (general) (routine) without abnormal findings: Secondary | ICD-10-CM | POA: Diagnosis not present

## 2017-02-02 DIAGNOSIS — Z800 Family history of malignant neoplasm of digestive organs: Secondary | ICD-10-CM | POA: Diagnosis not present

## 2017-02-02 DIAGNOSIS — M316 Other giant cell arteritis: Secondary | ICD-10-CM | POA: Diagnosis not present

## 2017-03-21 ENCOUNTER — Ambulatory Visit (INDEPENDENT_AMBULATORY_CARE_PROVIDER_SITE_OTHER): Payer: Medicare Other | Admitting: Neurology

## 2017-03-21 ENCOUNTER — Encounter: Payer: Self-pay | Admitting: Neurology

## 2017-03-21 VITALS — BP 130/70 | HR 70 | Ht 63.0 in | Wt 197.0 lb

## 2017-03-21 DIAGNOSIS — M316 Other giant cell arteritis: Secondary | ICD-10-CM | POA: Diagnosis not present

## 2017-03-21 NOTE — Patient Instructions (Signed)
Looking good! Follow-up as needed, call for any changes

## 2017-03-21 NOTE — Progress Notes (Signed)
NEUROLOGY FOLLOW UP OFFICE NOTE  AVALEEN Krueger [id number]  DOB: [birth year]  HISTORY OF PRESENT ILLNESS: I had the pleasure of seeing Kim Krueger in follow-up in the neurology clinic on 03/21/2017.  The patient was last seen 6 months ago for right temporal arteritis. She underwent temporal artery biopsy which confirmed the diagnosis. She has tapered off Prednisone last November, and off Gabapentin as well, and is doing very well with no headaches, vision changes. She has seen her eye doctor with no change on eye exam. She denies any dizziness, focal numbness/tingling/weakness. She fell last December and has left knee pain. She has noticed a little cramping on the left side of her neck when she moves it. She has noticed some rumbling in her stomach after eating and is seeing GI.   HPI 10/05/2015: This is a 77 yo RH woman with a history of hypothyroidism, who presented with new onset headaches. She started having pain around her right ear and localized pain on the right temporal region . She describes the pain as a soreness and ache radiating down her right cheek. She went to her PCP office and was told ear exam was normal, given Levofloxacin and a nasal spray. She saw her dentist a few days after and was told her bite was off and measured her for a mouth guard which she got the same day. She wore this for 2 nights, then woke up Wednesday morning with the right inside of her cheek feeling mutilated from the mouth guard. She was in a lot of pain and taking 3 tablets of Motrin every 4 hours. That evening, she saw 2 black rectangles blocking the lower part of her vision. She went to bed and was woken up by shooting pain on the right side, no further vision symptoms. She went to the ER on 10/01/15 due to continued pain, reporting taking 28 Motrin over a 24-hour period. She was given a dose of Fioricet which helped for a time. ER records were reviewed, ESR was elevated at 90, CRP elevated at 18.6. CBC and BMP  were unremarkable except for mildly elevated creatinine of 1.07. I personally reviewed head CT without contrast which did not show any acute changes, there was age-related atrophy and chronic microvascular disease. She was discharged home on Prednisone '80mg'$  daily and instructions to take Motrin every 4 hours. Pain would go down to a 0/10, but after 4 hours, go back up to 8/10. With starting prednisone, she can go 6-8 hours without taking Motrin. Tylenol did not help. She has lost 10 lbs since pain started due to mouth issues, she feels like there are ulcers in her mouth and only takes soft foods and yoghurt. She denies any ear pain but feels like there is draining inside her right ear, no hearing loss or tinnitus. She denies any conjunctival injection or tearing, no focal numbness/tingling/weakness. No neck/back pain. She denies any prior similar symptoms, no prior history of headaches.   PAST MEDICAL HISTORY: Past Medical History:  Diagnosis Date  . Cancer [facility])    uterine cancer, had hysterectomy  . Hypothyroidism   . Temporal arteritis ([place])   . Thyroid disease   . Urge incontinence     MEDICATIONS:  Outpatient Encounter Medications as of 03/21/2017  Medication Sig  . Cholecalciferol (VITAMIN D) 2000 units CAPS Take 1 capsule by mouth daily.   . Cyanocobalamin (VITAMIN B-12 CR) 1500 MCG TBCR Take 1 tablet by mouth daily.   . ferrous sulfate 325 (  65 FE) MG EC tablet Take 325 mg by mouth 3 (three) times daily with meals.  Marland Kitchen levothyroxine (SYNTHROID, LEVOTHROID) 88 MCG tablet Take 88 mcg by mouth daily.  . [DISCONTINUED] gabapentin (NEURONTIN) 100 MG capsule Take 1 capsule (100 mg total) by mouth at bedtime. Take 1 tablet at bedtime. Monitor headaches.  . [DISCONTINUED] predniSONE (DELTASONE) 1 MG tablet TAKE 4 TABLETS BY MOUTH ONCE DAILY STARTING JULY 1ST, THEN TAKE 3 TABLETS DAILY STARTING AUGUST 1ST   No facility-administered encounter medications on file as of 03/21/2017.      ALLERGIES: Allergies  Allergen Reactions  . Ceftin [Cefuroxime Axetil] Nausea Only    Caused sever yeast infection  . Acetaminophen     "extra strength makes the pain worse"   . Hydrocodone   . Sulfa Antibiotics Other (See Comments)    Unknown childhood reaction after surgery   . Tramadol Other (See Comments)    Cause pain to worsen   . Penicillins Rash    Has patient had a PCN reaction causing immediate rash, facial/tongue/throat swelling, SOB or lightheadedness with hypotension: Yes Has patient had a PCN reaction causing severe rash involving mucus membranes or skin necrosis: No Has patient had a PCN reaction that required hospitalization No Has patient had a PCN reaction occurring within the last 10 years: No If all of the above answers are "NO", then may proceed with Cephalosporin use.     FAMILY HISTORY: History reviewed. No pertinent family history.  SOCIAL HISTORY: Social History   Socioeconomic History  . Marital status: Married    Spouse name: Not on file  . Number of children: Not on file  . Years of education: Not on file  . Highest education level: Not on file  Social Needs  . Financial resource strain: Not on file  . Food insecurity - worry: Not on file  . Food insecurity - inability: Not on file  . Transportation needs - medical: Not on file  . Transportation needs - non-medical: Not on file  Occupational History  . Not on file  Tobacco Use  . Smoking status: Never Smoker  . Smokeless tobacco: Never Used  Substance and Sexual Activity  . Alcohol use: No  . Drug use: No  . Sexual activity: Not on file  Other Topics Concern  . Not on file  Social History Narrative  . Not on file    REVIEW OF SYSTEMS: Constitutional: No fevers, chills, or sweats, no generalized fatigue, change in appetite Eyes: as above Ear, nose and throat: No hearing loss, ear pain, nasal congestion, sore throat Cardiovascular: No chest pain, palpitations Respiratory:  No  shortness of breath at rest or with exertion, wheezes GastrointestinaI: No nausea, vomiting, diarrhea, abdominal pain, fecal incontinence Genitourinary:  No dysuria, urinary retention or frequency Musculoskeletal:  No neck pain, back pain Integumentary: No rash, pruritus, skin lesions Neurological: as above Psychiatric: No depression, insomnia, anxiety Endocrine: No palpitations, fatigue, diaphoresis, mood swings, change in appetite, change in weight, increased thirst Hematologic/Lymphatic:  No anemia, purpura, petechiae. Allergic/Immunologic: no itchy/runny eyes, nasal congestion, recent allergic reactions, rashes  PHYSICAL EXAM: Vitals:   03/21/17 1606  BP: 130/70  Pulse: 70   General: No acute distress Head:  Normocephalic, well-healing scar in right temporal region, no temporal tenderness Neck: supple, no paraspinal tenderness, full range of motion Heart:  Regular rate and rhythm Lungs:  Clear to auscultation bilaterally Back: No paraspinal tenderness Skin/Extremities: No rash, no edema Neurological Exam: alert and oriented to person, place,  and time. No aphasia or dysarthria. Fund of knowledge is appropriate.  Recent and remote memory are intact.  Attention and concentration are normal.    Able to name objects and repeat phrases. Cranial nerves: Pupils equal, round, reactive to light.  Extraocular movements intact with no nystagmus. Visual fields full. Facial sensation intact. No facial asymmetry. Tongue, uvula, palate midline.  Motor: Bulk and tone normal, muscle strength 5/5 throughout with no pronator drift.  Sensation to light touch intact.  No extinction to double simultaneous stimulation.  Deep tendon reflexes 2+ throughout, toes downgoing.  Finger to nose testing intact.  Gait slow and cautious due to left knee pain, no ataxia.  IMPRESSION: This is a 77 yo RH woman with a history of hypothyroidism, who presented with new onset right temporal pain also affecting her right ear  and right jaw region. Temporal artery biopsy confirmed temporal arteritis. She has tapered off Prednisone and gabapentin, with no recurrence in symptoms. Neurological exam normal. She will follow-up on a prn basis and knows to call for any changes.   Thank you for allowing me to participate in her care.  Please do not hesitate to call for any questions or concerns.  The duration of this appointment visit was 15 minutes of face-to-face time with the patient.  Greater than 50% of this time was spent in counseling, explanation of diagnosis, planning of further management, and coordination of care.   Ellouise Newer, M.D.   CC: Dr. Osborne Casco

## 2017-04-06 DIAGNOSIS — K648 Other hemorrhoids: Secondary | ICD-10-CM | POA: Diagnosis not present

## 2017-04-06 DIAGNOSIS — D120 Benign neoplasm of cecum: Secondary | ICD-10-CM | POA: Diagnosis not present

## 2017-04-06 DIAGNOSIS — Z800 Family history of malignant neoplasm of digestive organs: Secondary | ICD-10-CM | POA: Diagnosis not present

## 2017-05-09 DIAGNOSIS — Z85828 Personal history of other malignant neoplasm of skin: Secondary | ICD-10-CM | POA: Diagnosis not present

## 2017-05-09 DIAGNOSIS — L821 Other seborrheic keratosis: Secondary | ICD-10-CM | POA: Diagnosis not present

## 2017-05-09 DIAGNOSIS — L570 Actinic keratosis: Secondary | ICD-10-CM | POA: Diagnosis not present

## 2017-06-14 DIAGNOSIS — M25562 Pain in left knee: Secondary | ICD-10-CM | POA: Diagnosis not present

## 2017-06-14 DIAGNOSIS — Z6824 Body mass index (BMI) 24.0-24.9, adult: Secondary | ICD-10-CM | POA: Diagnosis not present

## 2017-06-14 DIAGNOSIS — D509 Iron deficiency anemia, unspecified: Secondary | ICD-10-CM | POA: Diagnosis not present

## 2017-06-14 DIAGNOSIS — D508 Other iron deficiency anemias: Secondary | ICD-10-CM | POA: Diagnosis not present

## 2017-11-09 DIAGNOSIS — Z23 Encounter for immunization: Secondary | ICD-10-CM | POA: Diagnosis not present

## 2017-11-09 DIAGNOSIS — R3129 Other microscopic hematuria: Secondary | ICD-10-CM | POA: Diagnosis not present

## 2017-11-09 DIAGNOSIS — N390 Urinary tract infection, site not specified: Secondary | ICD-10-CM | POA: Diagnosis not present

## 2017-12-05 DIAGNOSIS — E0500 Thyrotoxicosis with diffuse goiter without thyrotoxic crisis or storm: Secondary | ICD-10-CM | POA: Diagnosis not present

## 2017-12-05 DIAGNOSIS — N390 Urinary tract infection, site not specified: Secondary | ICD-10-CM | POA: Diagnosis not present

## 2017-12-05 DIAGNOSIS — Z23 Encounter for immunization: Secondary | ICD-10-CM | POA: Diagnosis not present

## 2017-12-05 DIAGNOSIS — M859 Disorder of bone density and structure, unspecified: Secondary | ICD-10-CM | POA: Diagnosis not present

## 2017-12-05 DIAGNOSIS — E7800 Pure hypercholesterolemia, unspecified: Secondary | ICD-10-CM | POA: Diagnosis not present

## 2017-12-12 DIAGNOSIS — N905 Atrophy of vulva: Secondary | ICD-10-CM | POA: Diagnosis not present

## 2017-12-12 DIAGNOSIS — Z0000 Encounter for general adult medical examination without abnormal findings: Secondary | ICD-10-CM | POA: Diagnosis not present

## 2017-12-12 DIAGNOSIS — Z1212 Encounter for screening for malignant neoplasm of rectum: Secondary | ICD-10-CM | POA: Diagnosis not present

## 2017-12-12 DIAGNOSIS — N3941 Urge incontinence: Secondary | ICD-10-CM | POA: Diagnosis not present

## 2017-12-12 DIAGNOSIS — M316 Other giant cell arteritis: Secondary | ICD-10-CM | POA: Diagnosis not present

## 2017-12-13 DIAGNOSIS — M25562 Pain in left knee: Secondary | ICD-10-CM

## 2017-12-13 HISTORY — DX: Pain in left knee: M25.562

## 2017-12-17 HISTORY — PX: BREAST BIOPSY: SHX20

## 2017-12-19 DIAGNOSIS — M25562 Pain in left knee: Secondary | ICD-10-CM | POA: Diagnosis not present

## 2017-12-26 DIAGNOSIS — Z1231 Encounter for screening mammogram for malignant neoplasm of breast: Secondary | ICD-10-CM | POA: Diagnosis not present

## 2018-01-05 DIAGNOSIS — N6325 Unspecified lump in the left breast, overlapping quadrants: Secondary | ICD-10-CM | POA: Diagnosis not present

## 2018-01-05 DIAGNOSIS — N6324 Unspecified lump in the left breast, lower inner quadrant: Secondary | ICD-10-CM | POA: Diagnosis not present

## 2018-01-15 ENCOUNTER — Other Ambulatory Visit: Payer: Self-pay

## 2018-01-15 DIAGNOSIS — Z01818 Encounter for other preprocedural examination: Secondary | ICD-10-CM | POA: Diagnosis not present

## 2018-01-15 DIAGNOSIS — C50912 Malignant neoplasm of unspecified site of left female breast: Secondary | ICD-10-CM | POA: Diagnosis not present

## 2018-01-15 DIAGNOSIS — N6325 Unspecified lump in the left breast, overlapping quadrants: Secondary | ICD-10-CM | POA: Diagnosis not present

## 2018-01-18 ENCOUNTER — Telehealth: Payer: Self-pay | Admitting: *Deleted

## 2018-01-18 NOTE — Telephone Encounter (Signed)
Spoke to pt regarding [facility]. Pt wishes to see her own team. Pt would like to see [facility] for surgery and [facility] for medical oncology. Informed [facility] of pt decision

## 2018-01-23 DIAGNOSIS — H40013 Open angle with borderline findings, low risk, bilateral: Secondary | ICD-10-CM | POA: Diagnosis not present

## 2018-01-23 DIAGNOSIS — M316 Other giant cell arteritis: Secondary | ICD-10-CM | POA: Diagnosis not present

## 2018-01-23 DIAGNOSIS — H2513 Age-related nuclear cataract, bilateral: Secondary | ICD-10-CM | POA: Diagnosis not present

## 2018-01-31 ENCOUNTER — Ambulatory Visit: Payer: Self-pay | Admitting: General Surgery

## 2018-01-31 DIAGNOSIS — C50912 Malignant neoplasm of unspecified site of left female breast: Secondary | ICD-10-CM | POA: Diagnosis not present

## 2018-01-31 DIAGNOSIS — Z170 Estrogen receptor positive status [ER+]: Secondary | ICD-10-CM | POA: Diagnosis not present

## 2018-02-02 ENCOUNTER — Other Ambulatory Visit: Payer: Self-pay | Admitting: Obstetrics and Gynecology

## 2018-02-02 DIAGNOSIS — Z01419 Encounter for gynecological examination (general) (routine) without abnormal findings: Secondary | ICD-10-CM | POA: Diagnosis not present

## 2018-02-06 ENCOUNTER — Telehealth: Payer: Self-pay | Admitting: Hematology and Oncology

## 2018-02-06 ENCOUNTER — Encounter: Payer: Self-pay | Admitting: Hematology and Oncology

## 2018-02-06 NOTE — Telephone Encounter (Signed)
Received a new patient referral from Dr. Excell Seltzer for a new dx of breast cancer. Pt has been cld and scheduled to see Dr. Lindi Adie on 1/27 at 1pm. Pt aware to arrive 30 minutes early. Letter mailed.

## 2018-02-12 ENCOUNTER — Telehealth: Payer: Self-pay | Admitting: Hematology and Oncology

## 2018-02-12 ENCOUNTER — Encounter: Payer: Self-pay | Admitting: *Deleted

## 2018-02-12 ENCOUNTER — Inpatient Hospital Stay: Payer: Medicare Other | Attending: Hematology and Oncology | Admitting: Hematology and Oncology

## 2018-02-12 DIAGNOSIS — C50212 Malignant neoplasm of upper-inner quadrant of left female breast: Secondary | ICD-10-CM

## 2018-02-12 DIAGNOSIS — Z170 Estrogen receptor positive status [ER+]: Secondary | ICD-10-CM

## 2018-02-12 HISTORY — DX: Malignant neoplasm of upper-inner quadrant of left female breast: C50.212

## 2018-02-12 HISTORY — DX: Malignant neoplasm of upper-inner quadrant of left female breast: Z17.0

## 2018-02-12 NOTE — Assessment & Plan Note (Signed)
01/15/2018: Screening detected left breast calcifications 0.5 cm 9 o'clock position 7 cm from the nipple.  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 After reviewing the pathology in detail, we proceeded to discuss the different treatment options between surgery, radiation, and antiestrogen therapies.  Treatment plan: 1.  Breast conserving surgery 2.  Plus/minus radiation 3.  Adjuvant antiestrogen therapy with anastrozole 1 mg daily x5 years  Return to clinic after surgery to discuss the pathology report.

## 2018-02-12 NOTE — Telephone Encounter (Signed)
Gave avs and calendar per MD wanted patient get genectics

## 2018-02-12 NOTE — Progress Notes (Signed)
[facility] CONSULT NOTE  Patient Care Team: Tisovec, Fransico Him, MD as PCP - General (Internal Medicine)  CHIEF COMPLAINTS/PURPOSE OF CONSULTATION:  Newly diagnosed breast cancer  HISTORY OF PRESENTING ILLNESS:  Kim Krueger 78 y.o. female is here because of recent diagnosis of left breast cancer.  Patient had a routine screening mammogram that revealed abnormality in the left breast with calcifications measuring 5 mm in size.  She underwent ultrasound and biopsies which revealed a grade 1 through 2 IDC that was ER PR positive and HER-2 negative with a Ki-67 of 5%.  She saw Dr. Excell Seltzer who plans to do lumpectomy on 02/28/2018.  She is here today to discuss overall treatment plan accompanied by her husband.  I reviewed her records extensively and collaborated the history with the patient.  SUMMARY OF ONCOLOGIC HISTORY:   Malignant neoplasm of upper-inner quadrant of left breast in female, estrogen receptor positive ([place])   01/15/2018 Initial Diagnosis    Screening detected left breast calcifications 0.5 cm 9 o'clock position 7 cm from the nipple.  Biopsy revealed IDC grade 1 through 2 with low-grade DCIS      MEDICAL HISTORY:  Past Medical History:  Diagnosis Date  . Cancer [facility])    uterine cancer, had hysterectomy  . Hypothyroidism   . Temporal arteritis ([place])   . Thyroid disease   . Urge incontinence     SURGICAL HISTORY: Past Surgical History:  Procedure Laterality Date  . ABDOMINAL HYSTERECTOMY     ovaries removed as well  . APPENDECTOMY     appendix ruptured as child, still have part of appendix  . ARTERY BIOPSY Right 10/19/2015   Procedure: RIGHT TEMPORAL ARTERY BIOPSY;  Surgeon: Judeth Horn, MD;  Location: [facility];  Service: General;  Laterality: Right;  . INSERTION OF MESH N/A 03/28/2016   Procedure: INSERTION OF MESH;  Surgeon: Judeth Horn, MD;  Location: [facility];  Service: General;  Laterality: N/A;  . UMBILICAL HERNIA REPAIR N/A  03/28/2016   Procedure: LAPAROSCOPIC UMBILICAL HERNIA;  Surgeon: Judeth Horn, MD;  Location: [facility];  Service: General;  Laterality: N/A;    SOCIAL HISTORY: Social History   Socioeconomic History  . Marital status: Married    Spouse name: Not on file  . Number of children: Not on file  . Years of education: Not on file  . Highest education level: Not on file  Occupational History  . Not on file  Social Needs  . Financial resource strain: Not on file  . Food insecurity:    Worry: Not on file    Inability: Not on file  . Transportation needs:    Medical: Not on file    Non-medical: Not on file  Tobacco Use  . Smoking status: Never Smoker  . Smokeless tobacco: Never Used  Substance and Sexual Activity  . Alcohol use: No  . Drug use: No  . Sexual activity: Not on file  Lifestyle  . Physical activity:    Days per week: Not on file    Minutes per session: Not on file  . Stress: Not on file  Relationships  . Social connections:    Talks on phone: Not on file    Gets together: Not on file    Attends religious service: Not on file    Active member of club or organization: Not on file    Attends meetings of clubs or organizations: Not on file    Relationship status: Not on file  .  Intimate partner violence:    Fear of current or ex partner: Not on file    Emotionally abused: Not on file    Physically abused: Not on file    Forced sexual activity: Not on file  Other Topics Concern  . Not on file  Social History Narrative  . Not on file    FAMILY HISTORY: No family history on file.  ALLERGIES:  is allergic to ceftin [cefuroxime axetil]; acetaminophen; hydrocodone; sulfa antibiotics; tramadol; and penicillins.  MEDICATIONS:  Current Outpatient Medications  Medication Sig Dispense Refill  . Cholecalciferol (VITAMIN D) 2000 units CAPS Take 1 capsule by mouth daily.     . Cyanocobalamin (VITAMIN B-12 CR) 1500 MCG TBCR Take 1 tablet by mouth daily.     . ferrous sulfate  325 (65 FE) MG EC tablet Take 325 mg by mouth 3 (three) times daily with meals.    Marland Kitchen levothyroxine (SYNTHROID, LEVOTHROID) 88 MCG tablet Take 88 mcg by mouth daily.  1   No current facility-administered medications for this visit.     REVIEW OF SYSTEMS:   Constitutional: Denies fevers, chills or abnormal night sweats Eyes: Denies blurriness of vision, double vision or watery eyes Ears, nose, mouth, throat, and face: Denies mucositis or sore throat Respiratory: Denies cough, dyspnea or wheezes Cardiovascular: Denies palpitation, chest discomfort or lower extremity swelling Gastrointestinal:  Denies nausea, heartburn or change in bowel habits Skin: Denies abnormal skin rashes Lymphatics: Denies new lymphadenopathy or easy bruising Neurological:Denies numbness, tingling or new weaknesses Behavioral/Psych: Mood is stable, no new changes  Breast:  Denies any palpable lumps or discharge Extremities: Arthritis in the knees gets steroid injections All other systems were reviewed with the patient and are negative.  PHYSICAL EXAMINATION: ECOG PERFORMANCE STATUS: 0 - Asymptomatic  Vitals:   02/12/18 1237  BP: 138/72  Pulse: 69  Resp: 17  Temp: 98.3 F (36.8 C)  SpO2: 99%   Filed Weights   02/12/18 1237  Weight: 180 lb 9.6 oz (81.9 kg)    GENERAL:alert, no distress and comfortable SKIN: skin color, texture, turgor are normal, no rashes or significant lesions EYES: normal, conjunctiva are pink and non-injected, sclera clear OROPHARYNX:no exudate, no erythema and lips, buccal mucosa, and tongue normal  NECK: supple, thyroid normal size, non-tender, without nodularity LYMPH:  no palpable lymphadenopathy in the cervical, axillary or inguinal LUNGS: clear to auscultation and percussion with normal breathing effort HEART: regular rate & rhythm and no murmurs and no lower extremity edema ABDOMEN:abdomen soft, non-tender and normal bowel sounds Musculoskeletal:no cyanosis of digits and no  clubbing  PSYCH: alert & oriented x 3 with fluent speech NEURO: no focal motor/sensory deficits BREAST: No palpable nodules in breast. No palpable axillary or supraclavicular lymphadenopathy (exam performed in the presence of a chaperone)   LABORATORY DATA:  I have reviewed the data as listed Lab Results  Component Value Date   WBC 9.5 03/25/2016   HGB 10.8 (L) 03/25/2016   HCT 36.1 03/25/2016   MCV 83.8 03/25/2016   PLT 291 03/25/2016   Lab Results  Component Value Date   NA 141 03/25/2016   K 4.2 03/25/2016   CL 109 03/25/2016   CO2 24 03/25/2016    RADIOGRAPHIC STUDIES: I have personally reviewed the radiological reports and agreed with the findings in the report.  ASSESSMENT AND PLAN:  Malignant neoplasm of upper-inner quadrant of left breast in female, estrogen receptor positive ([place]) 01/15/2018: Screening detected left breast calcifications 0.5 cm 9 o'clock position 7  cm from the nipple.  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 After reviewing the pathology in detail, we proceeded to discuss the different treatment options between surgery, radiation, and antiestrogen therapies.  Treatment plan: 1.  Breast conserving surgery 2.  Plus/minus radiation 3.  Adjuvant antiestrogen therapy with anastrozole 1 mg daily x5 years  Return to clinic after surgery to discuss the pathology report.   All questions were answered. The patient knows to call the clinic with any problems, questions or concerns.    Harriette Ohara, MD 02/12/18

## 2018-02-20 ENCOUNTER — Encounter (HOSPITAL_BASED_OUTPATIENT_CLINIC_OR_DEPARTMENT_OTHER): Payer: Self-pay | Admitting: *Deleted

## 2018-02-20 ENCOUNTER — Other Ambulatory Visit: Payer: Self-pay

## 2018-02-26 DIAGNOSIS — C50212 Malignant neoplasm of upper-inner quadrant of left female breast: Secondary | ICD-10-CM | POA: Diagnosis not present

## 2018-02-26 DIAGNOSIS — D0592 Unspecified type of carcinoma in situ of left breast: Secondary | ICD-10-CM | POA: Diagnosis not present

## 2018-02-26 NOTE — Progress Notes (Signed)
Pt given pre surgical Ensure for DOS along with instructions and a Hibiclens soap with instructions.

## 2018-02-27 IMAGING — CT CT MAXILLOFACIAL W/O CM
3 of 8 series · 15 of 47 positions shown, 18 images · non-contrast
Comparison: None.

CLINICAL DATA: Right jaw pain and right-sided headache for 1 week

EXAM:
CT MAXILLOFACIAL WITHOUT CONTRAST
TECHNIQUE: Multidetector CT imaging of the maxillofacial structures was
performed. Multiplanar CT image reconstructions were also generated.
A small metallic BB was placed on the right temple in order to
reliably differentiate right from left.

[Series 3: head bone · axial · 0.45mm/px · z∈[+1302,+1428]mm · 11 of 77 slices shown, 14 images]
[im 7/77  brain]
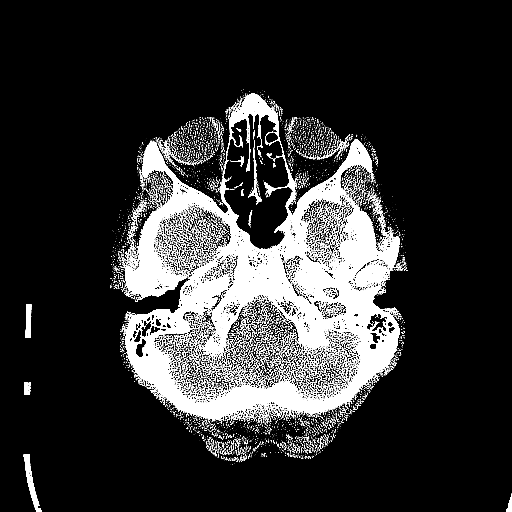
[im 7/77  bone]
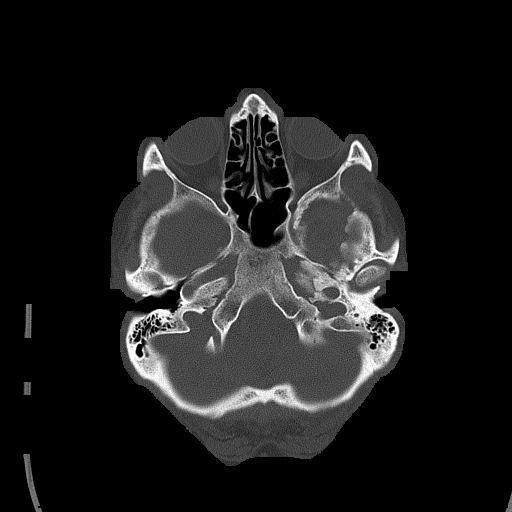
[im 13/77  bone]
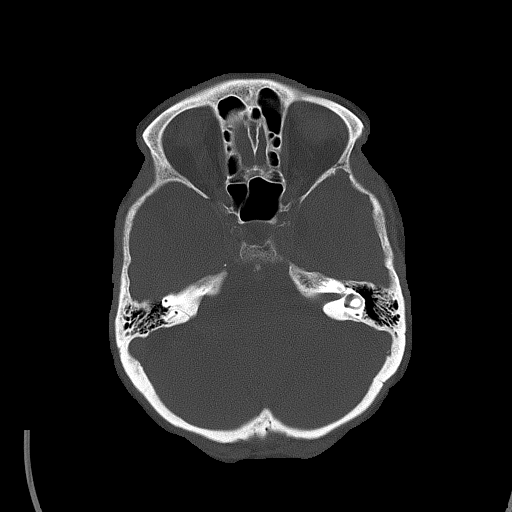
[im 20/77  bone]
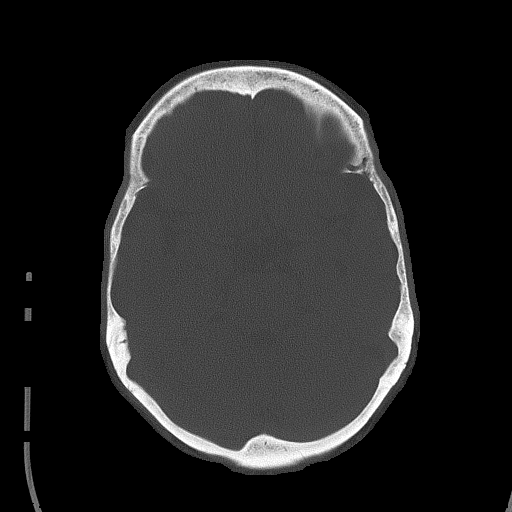
[im 26/77  bone]
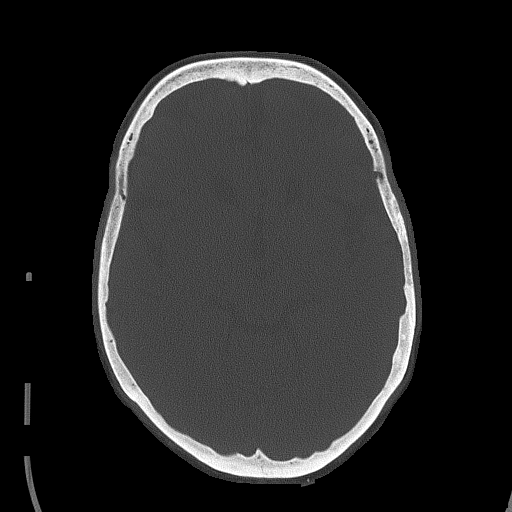
[im 32/77  brain]
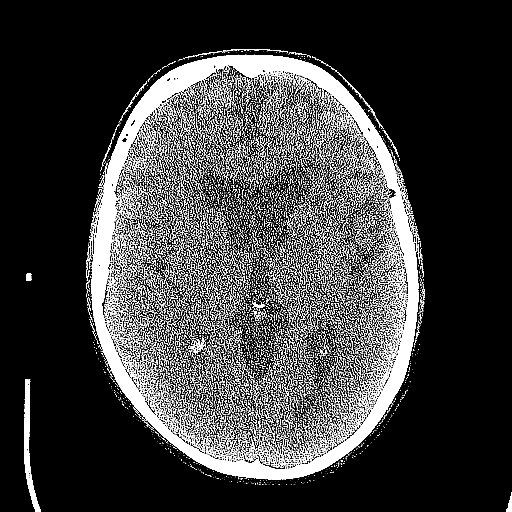
[im 32/77  bone]
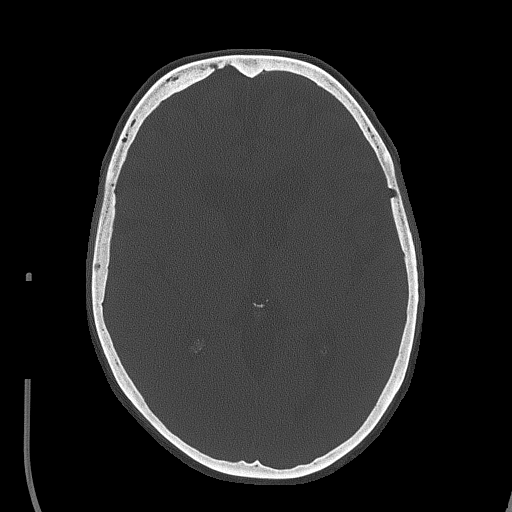
[im 39/77  bone]
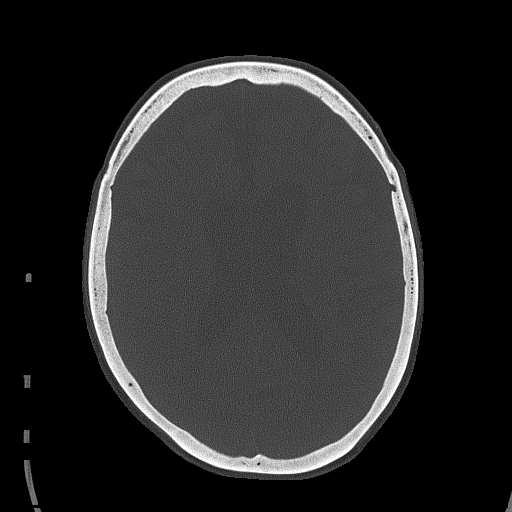
[im 45/77  bone]
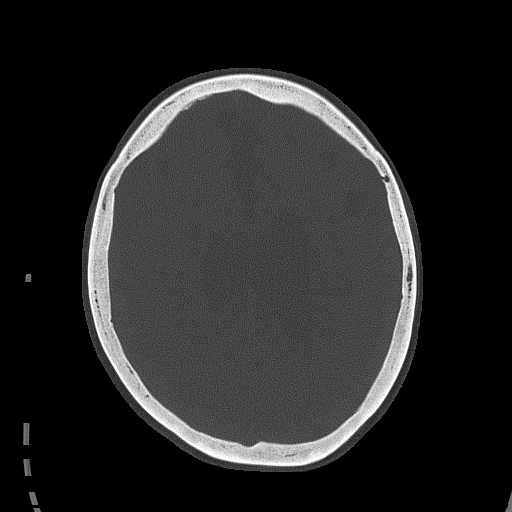
[im 51/77  bone]
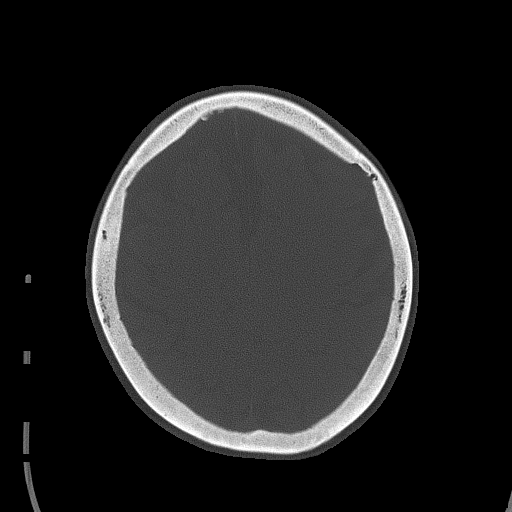
[im 58/77  brain]
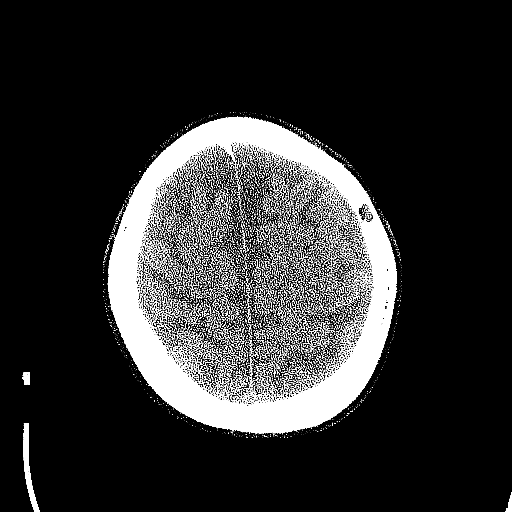
[im 58/77  bone]
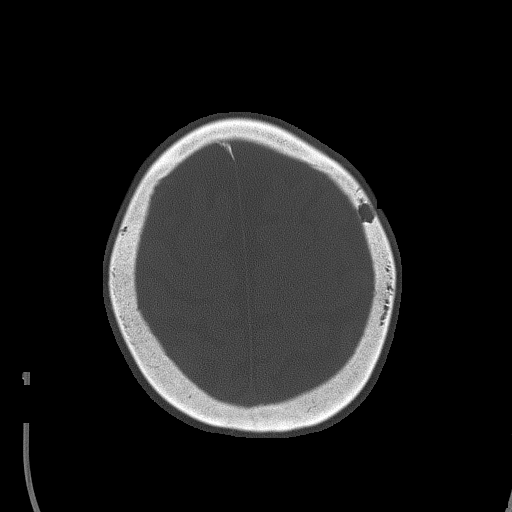
[im 64/77  bone]
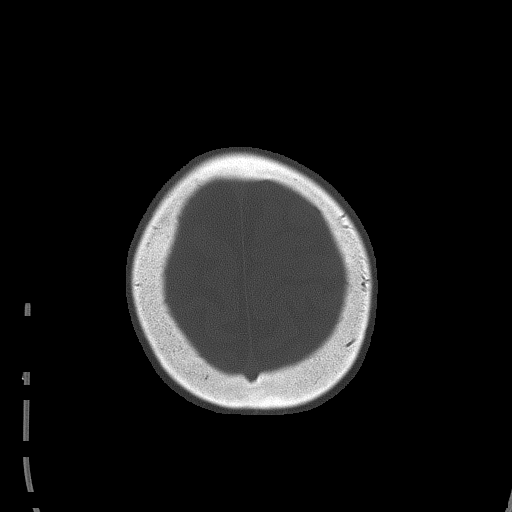
[im 70/77  bone]
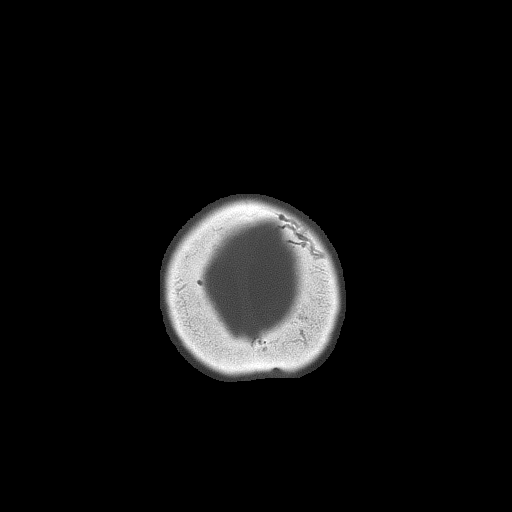

[Series 9: bone 2.0 sag · sagittal · 0.27mm/px · 1 of 75 slices shown]
[im 38/75  bone]
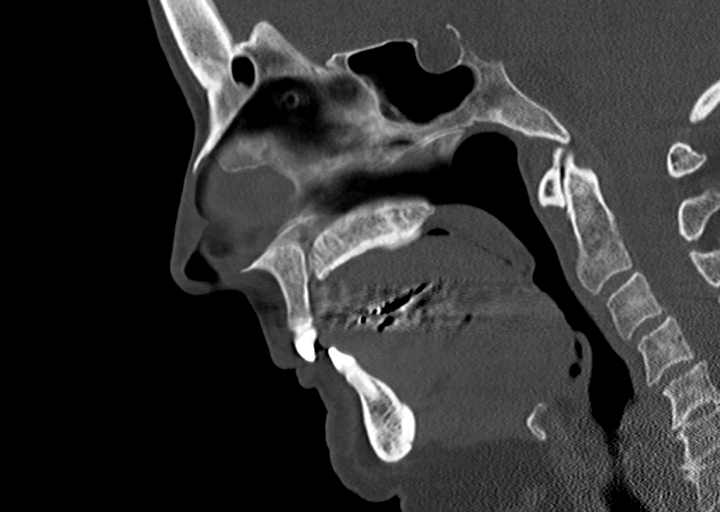

[Series 10: facialbone 2.0 cor st · coronal · 0.37mm/px · 3 of 71 slices shown]
[im 48/71  bone]
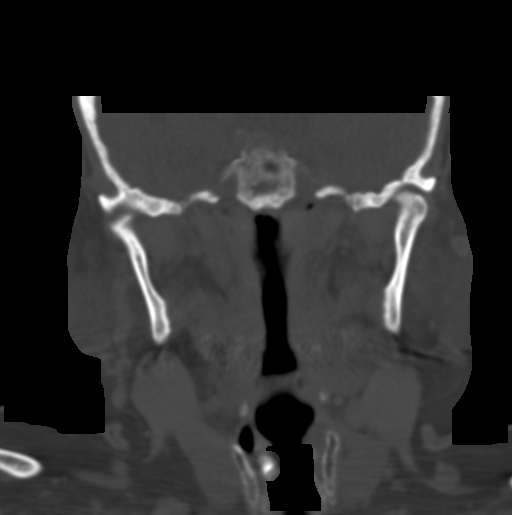
[im 52/71  bone]
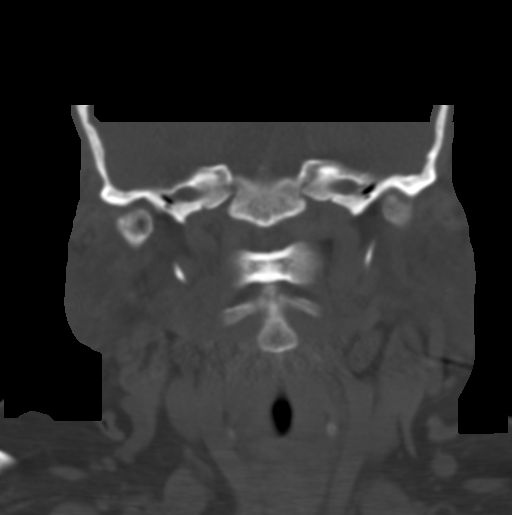
[im 57/71  bone]
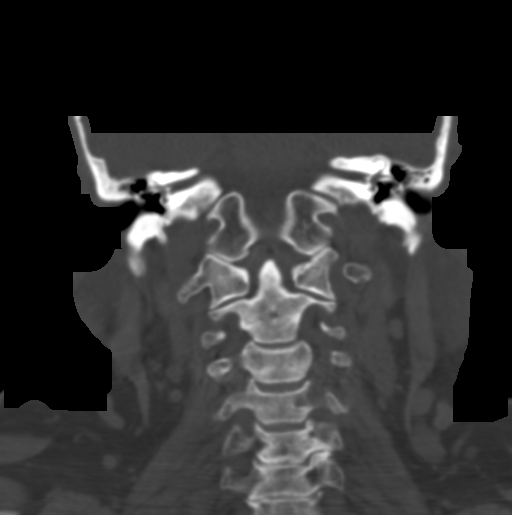

[15 of 47 positions shown; findings below may reference images not displayed]

FINDINGS: Osseous: No dental abnormality is seen. The orbital rims appear
intact. The zygomatic arches are intact. The temporomandibular
condyles are in normal position.

Orbits: The orbital rims appear intact. No periorbital air or
abnormal soft tissue is seen.

Sinuses: The paranasal sinuses are clear. The nasal turbinates are
normal in position and the nasal airway is patent.

Soft tissues: No soft tissue abnormality is seen.

Limited intracranial: No intracranial abnormality seen on the
limited views obtained.
IMPRESSION: Negative CT maxillofacial. The sinuses are clear and no
maxillofacial bony abnormality is seen.

## 2018-02-27 NOTE — H&P (Signed)
History of Present Illness Kim Kitchen T. Asal Teas MD; 01/31/2018 3:06 PM) The patient is a 78 year old female who presents with breast cancer. She is a post menopausal female referred by Dr. Darleen Crocker for evaluation of recently diagnosed carcinoma of the left breast. She recently presented for a screening mamogram revealing an apparent new small mass in the left breast. Subsequent imaging included diagnostic mamogram showing a 0.7 cm high density mass associated with calcifications in the left breast at 8:00 7 cm from the nipple and ultrasound showing a 0.7 cm hypoechoic mass. An ultrasound guided breast biopsy was performed on January 15, 2018 with pathology revealing invasive ductal carcinoma of the breast associated with DCIS. She is seen now in the office for initial treatment planning. She has experienced no breast symptoms, specifically lump or pain or nipple discharge. She does not have a personal history of any previous breast problems.  Findings at that time were the following: Tumor size: 0.7 cm Tumor grade: 1-2, Ki-67 5% Estrogen Receptor: Positive Progesterone Receptor: Positive Her-2 neu: Negative Lymph node status: Negative    Allergies Alean Rinne, RMA; 01/31/2018 2:21 PM) Ceftin *CEPHALOSPORINS*  Penicillin G Pot in Dextrose *PENICILLINS*  Sulfa 10 *OPHTHALMIC AGENTS*  Hydrocodone-Acetaminophen *ANALGESICS - OPIOID*  throat burns Allergies Reconciled   Medication History Alean Rinne, RMA; 01/31/2018 2:21 PM) Norco (5-'325MG'$  Tablet, 1-2 Tablet Oral every four hours, as needed for pain, Taken starting 10/19/2015) Active. Levothyroxine Sodium (88MCG Tablet, Oral) Active. Vitamin B 12 (Oral) Specific strength unknown - Active. Vitamin D3 (2000UNIT Capsule, Oral) Active. Aspirin ('81MG'$  Tablet, Oral) Active. Gabapentin ('100MG'$  Capsule, Oral) Active. PredniSONE ('5MG'$  Tablet, Oral) Active. PredniSONE ('1MG'$  Tablet, Oral) Active. Myrbetriq ('50MG'$  Tablet ER  24HR, Oral) Active. Medications Reconciled  Vitals Mardene Celeste King RMA; 01/31/2018 2:21 PM) 01/31/2018 2:20 PM Weight: 184 lb Height: 63in Body Surface Area: 1.87 m Body Mass Index: 32.59 kg/m  Temp.: 55F  Pulse: 97 (Regular)  BP: 150/80 (Sitting, Left Arm, Standard)       Physical Exam Kim Kitchen T. Rai Severns MD; 01/31/2018 3:19 PM) The physical exam findings are as follows: Note:General: Alert, moderately obese Caucasian female, in no distress Skin: Warm and dry without rash or infection. HEENT: No palpable masses or thyromegaly. Sclera nonicteric. Lymph nodes: No cervical, supraclavicular, nodes palpable. Breasts: D cup. No palpable masses in either breast. Bilateral nipple inversion. No palpable axillary adenopathy. No skin changes or nipple crusting. Lungs: Breath sounds clear and equal. No wheezing or increased work of breathing. Cardiovascular: Regular rate and rhythm without murmer. No JVD or edema. Abdomen: Nondistended. Soft and nontender. No masses palpable. No organomegaly. No palpable hernias. Extremities: No edema or joint swelling or deformity. No chronic venous stasis changes. Neurologic: Alert and fully oriented. Gait normal. No focal weakness. Psychiatric: Normal mood and affect. Thought content appropriate with normal judgement and insight    Assessment & Plan Kim Kitchen T. Sybil Shrader MD; 01/31/2018 3:22 PM) MALIGNANT NEOPLASM OF LEFT BREAST, STAGE 1, ESTROGEN RECEPTOR POSITIVE (C50.912) Impression: 78 year old female with a new diagnosis of cancer of the left breast, lower inner quadrant. Clinical stage 1A, ER/PR positive and HER-2 negative. I discussed with the patient and her daughter and husband present today initial surgical treatment options. We discussed options of breast conservation with lumpectomy or total mastectomy and sentinal lymph node biopsy/dissection. She would be ideal for lumpectomy which is what she prefers. I would not recommend a  sentinel lymph node biopsy based on her age and small low-grade tumor which was the  consensus in breast cancer conference today. We discussed the indications and nature of the procedure, and expected recovery, in detail. Surgical risks including anesthetic complications, cardiorespiratory complications, bleeding, infection, wound healing complications, blood clots, lymphedema, local and distant recurrence and possible need for further surgery based on the final pathology was discussed and understood. Chemotherapy, hormonal therapy and radiation therapy have been discussed. She likely would be a candidate for hormonal therapy alone. They have been provided with literature regarding the treatment of breast cancer. All questions were answered. They understand and agree to proceed and we will go ahead with scheduling. Current Plans Referred to Oncology, for evaluation and follow up (Oncology). Routine. Radioactive seed localized left breast lumpectomy under general anesthesia as an outpatient

## 2018-02-28 ENCOUNTER — Ambulatory Visit (HOSPITAL_BASED_OUTPATIENT_CLINIC_OR_DEPARTMENT_OTHER): Payer: Medicare Other | Admitting: Anesthesiology

## 2018-02-28 ENCOUNTER — Ambulatory Visit (HOSPITAL_BASED_OUTPATIENT_CLINIC_OR_DEPARTMENT_OTHER)
Admission: RE | Admit: 2018-02-28 | Discharge: 2018-02-28 | Disposition: A | Discharge status: Home / Self Care | Payer: Medicare Other | Attending: General Surgery | Admitting: General Surgery

## 2018-02-28 ENCOUNTER — Encounter (HOSPITAL_BASED_OUTPATIENT_CLINIC_OR_DEPARTMENT_OTHER)
Admission: RE | Disposition: A | Discharge status: Home / Self Care | Payer: Self-pay | Source: Home / Self Care | Attending: General Surgery

## 2018-02-28 ENCOUNTER — Other Ambulatory Visit: Payer: Self-pay

## 2018-02-28 ENCOUNTER — Encounter (HOSPITAL_BASED_OUTPATIENT_CLINIC_OR_DEPARTMENT_OTHER): Payer: Self-pay | Admitting: *Deleted

## 2018-02-28 DIAGNOSIS — C50312 Malignant neoplasm of lower-inner quadrant of left female breast: Secondary | ICD-10-CM | POA: Insufficient documentation

## 2018-02-28 DIAGNOSIS — E039 Hypothyroidism, unspecified: Secondary | ICD-10-CM | POA: Diagnosis not present

## 2018-02-28 DIAGNOSIS — D0592 Unspecified type of carcinoma in situ of left breast: Secondary | ICD-10-CM | POA: Diagnosis not present

## 2018-02-28 DIAGNOSIS — C50212 Malignant neoplasm of upper-inner quadrant of left female breast: Secondary | ICD-10-CM | POA: Diagnosis not present

## 2018-02-28 DIAGNOSIS — Z170 Estrogen receptor positive status [ER+]: Secondary | ICD-10-CM | POA: Insufficient documentation

## 2018-02-28 DIAGNOSIS — C50912 Malignant neoplasm of unspecified site of left female breast: Secondary | ICD-10-CM | POA: Diagnosis not present

## 2018-02-28 DIAGNOSIS — N6032 Fibrosclerosis of left breast: Secondary | ICD-10-CM | POA: Diagnosis not present

## 2018-02-28 HISTORY — PX: BREAST LUMPECTOMY WITH RADIOACTIVE SEED LOCALIZATION: SHX6424

## 2018-02-28 HISTORY — PX: BREAST LUMPECTOMY: SHX2

## 2018-02-28 SURGERY — BREAST LUMPECTOMY WITH RADIOACTIVE SEED LOCALIZATION
Anesthesia: General | Site: Breast | Laterality: Left

## 2018-02-28 MED ORDER — ACETAMINOPHEN 500 MG PO TABS
1000.0000 mg | ORAL_TABLET | Freq: Once | ORAL | Status: AC
  Administered 2018-02-28: 1000 mg via ORAL

## 2018-02-28 MED ORDER — BUPIVACAINE HCL (PF) 0.25 % IJ SOLN
INTRAMUSCULAR | Status: AC
  Filled 2018-02-28: qty 30

## 2018-02-28 MED ORDER — LIDOCAINE HCL (CARDIAC) PF 100 MG/5ML IV SOSY
PREFILLED_SYRINGE | INTRAVENOUS | Status: DC | PRN
  Administered 2018-02-28: 100 mg via INTRAVENOUS

## 2018-02-28 MED ORDER — SCOPOLAMINE 1 MG/3DAYS TD PT72: 1.0000 | MEDICATED_PATCH | Freq: Once | TRANSDERMAL | Status: DC | PRN

## 2018-02-28 MED ORDER — GABAPENTIN 100 MG PO CAPS: 100.0000 mg | ORAL_CAPSULE | Freq: Three times a day (TID) | ORAL | 0 refills | Status: DC

## 2018-02-28 MED ORDER — CIPROFLOXACIN IN D5W 400 MG/200ML IV SOLN
400.0000 mg | INTRAVENOUS | Status: AC
  Administered 2018-02-28: 400 mg via INTRAVENOUS

## 2018-02-28 MED ORDER — MIDAZOLAM HCL 2 MG/2ML IJ SOLN: 1.0000 mg | INTRAMUSCULAR | Status: DC | PRN

## 2018-02-28 MED ORDER — CHLORHEXIDINE GLUCONATE CLOTH 2 % EX PADS: 6.0000 | MEDICATED_PAD | Freq: Once | CUTANEOUS | Status: DC

## 2018-02-28 MED ORDER — HYDROMORPHONE HCL 1 MG/ML IJ SOLN: 0.2500 mg | INTRAMUSCULAR | Status: DC | PRN

## 2018-02-28 MED ORDER — BUPIVACAINE HCL (PF) 0.25 % IJ SOLN
INTRAMUSCULAR | Status: DC | PRN
  Administered 2018-02-28: 18 mL

## 2018-02-28 MED ORDER — FENTANYL CITRATE (PF) 100 MCG/2ML IJ SOLN
50.0000 ug | INTRAMUSCULAR | Status: DC | PRN
  Administered 2018-02-28 (×2): 50 ug via INTRAVENOUS

## 2018-02-28 MED ORDER — GABAPENTIN 100 MG PO CAPS
100.0000 mg | ORAL_CAPSULE | Freq: Once | ORAL | Status: AC
  Administered 2018-02-28: 100 mg via ORAL

## 2018-02-28 MED ORDER — EPHEDRINE SULFATE 50 MG/ML IJ SOLN
INTRAMUSCULAR | Status: DC | PRN
  Administered 2018-02-28: 15 mg via INTRAVENOUS

## 2018-02-28 MED ORDER — GABAPENTIN 100 MG PO CAPS
ORAL_CAPSULE | ORAL | Status: AC
  Filled 2018-02-28: qty 1

## 2018-02-28 MED ORDER — ONDANSETRON HCL 4 MG/2ML IJ SOLN
INTRAMUSCULAR | Status: DC | PRN
  Administered 2018-02-28: 4 mg via INTRAVENOUS

## 2018-02-28 MED ORDER — ACETAMINOPHEN 500 MG PO TABS
ORAL_TABLET | ORAL | Status: AC
  Filled 2018-02-28: qty 2

## 2018-02-28 MED ORDER — LACTATED RINGERS IV SOLN
INTRAVENOUS | Status: DC
  Administered 2018-02-28 (×2): via INTRAVENOUS

## 2018-02-28 MED ORDER — DEXAMETHASONE SODIUM PHOSPHATE 10 MG/ML IJ SOLN
INTRAMUSCULAR | Status: DC | PRN
  Administered 2018-02-28: 10 mg via INTRAVENOUS

## 2018-02-28 MED ORDER — CIPROFLOXACIN IN D5W 400 MG/200ML IV SOLN
INTRAVENOUS | Status: AC
  Filled 2018-02-28: qty 200

## 2018-02-28 SURGICAL SUPPLY — 49 items
ADH SKN CLS APL DERMABOND .7 (GAUZE/BANDAGES/DRESSINGS) ×1
BINDER BREAST LRG (GAUZE/BANDAGES/DRESSINGS) IMPLANT
BINDER BREAST MEDIUM (GAUZE/BANDAGES/DRESSINGS) IMPLANT
BINDER BREAST XLRG (GAUZE/BANDAGES/DRESSINGS) ×1 IMPLANT
BINDER BREAST XXLRG (GAUZE/BANDAGES/DRESSINGS) IMPLANT
BLADE SURG 15 STRL LF DISP TIS (BLADE) ×1 IMPLANT
BLADE SURG 15 STRL SS (BLADE) ×2
CANISTER SUC SOCK COL 7IN (MISCELLANEOUS) IMPLANT
CANISTER SUCT 1200ML W/VALVE (MISCELLANEOUS) IMPLANT
CHLORAPREP W/TINT 26ML (MISCELLANEOUS) ×2 IMPLANT
CLIP VESOCCLUDE SM WIDE 6/CT (CLIP) ×1 IMPLANT
COVER BACK TABLE 60X90IN (DRAPES) ×2 IMPLANT
COVER MAYO STAND STRL (DRAPES) ×2 IMPLANT
COVER PROBE W GEL 5X96 (DRAPES) ×2 IMPLANT
COVER WAND RF STERILE (DRAPES) IMPLANT
DECANTER SPIKE VIAL GLASS SM (MISCELLANEOUS) IMPLANT
DERMABOND ADVANCED (GAUZE/BANDAGES/DRESSINGS) ×1
DERMABOND ADVANCED .7 DNX12 (GAUZE/BANDAGES/DRESSINGS) ×1 IMPLANT
DRAPE LAPAROSCOPIC ABDOMINAL (DRAPES) ×2 IMPLANT
DRAPE UTILITY XL STRL (DRAPES) ×2 IMPLANT
ELECT COATED BLADE 2.86 ST (ELECTRODE) ×2 IMPLANT
ELECT REM PT RETURN 9FT ADLT (ELECTROSURGICAL) ×2
ELECTRODE REM PT RTRN 9FT ADLT (ELECTROSURGICAL) ×1 IMPLANT
GLOVE BIO SURGEON STRL SZ 6.5 (GLOVE) ×1 IMPLANT
GLOVE BIOGEL PI IND STRL 8 (GLOVE) ×1 IMPLANT
GLOVE BIOGEL PI INDICATOR 8 (GLOVE) ×1
GLOVE ECLIPSE 7.5 STRL STRAW (GLOVE) ×2 IMPLANT
GLOVE EXAM NITRILE MD LF STRL (GLOVE) ×1 IMPLANT
GOWN STRL REUS W/ TWL LRG LVL3 (GOWN DISPOSABLE) ×1 IMPLANT
GOWN STRL REUS W/ TWL XL LVL3 (GOWN DISPOSABLE) ×1 IMPLANT
GOWN STRL REUS W/TWL LRG LVL3 (GOWN DISPOSABLE) ×2
GOWN STRL REUS W/TWL XL LVL3 (GOWN DISPOSABLE) ×2
ILLUMINATOR WAVEGUIDE N/F (MISCELLANEOUS) IMPLANT
KIT MARKER MARGIN INK (KITS) ×2 IMPLANT
LIGHT WAVEGUIDE WIDE FLAT (MISCELLANEOUS) IMPLANT
NDL HYPO 25X1 1.5 SAFETY (NEEDLE) ×1 IMPLANT
NEEDLE HYPO 25X1 1.5 SAFETY (NEEDLE) ×2 IMPLANT
NS IRRIG 1000ML POUR BTL (IV SOLUTION) ×1 IMPLANT
PACK BASIN DAY SURGERY FS (CUSTOM PROCEDURE TRAY) ×2 IMPLANT
PENCIL BUTTON HOLSTER BLD 10FT (ELECTRODE) ×2 IMPLANT
SLEEVE SCD COMPRESS KNEE MED (MISCELLANEOUS) ×2 IMPLANT
SPONGE LAP 4X18 RFD (DISPOSABLE) ×2 IMPLANT
SUT MON AB 5-0 PS2 18 (SUTURE) ×2 IMPLANT
SUT VICRYL 3-0 CR8 SH (SUTURE) ×2 IMPLANT
SYR CONTROL 10ML LL (SYRINGE) ×2 IMPLANT
TOWEL GREEN STERILE FF (TOWEL DISPOSABLE) ×2 IMPLANT
TRAY FAXITRON CT DISP (TRAY / TRAY PROCEDURE) ×2 IMPLANT
TUBE CONNECTING 20X1/4 (TUBING) IMPLANT
YANKAUER SUCT BULB TIP NO VENT (SUCTIONS) IMPLANT

## 2018-02-28 NOTE — Anesthesia Preprocedure Evaluation (Addendum)
Anesthesia Evaluation  Patient identified by MRN, date of birth, ID band Patient awake    Reviewed: Allergy & Precautions, H&P , NPO status , Patient's Chart, lab work & pertinent test results  Airway Mallampati: II  TM Distance: >3 FB Neck ROM: Full    Dental no notable dental hx. (+) Teeth Intact, Dental Advisory Given   Pulmonary neg pulmonary ROS,    Pulmonary exam normal breath sounds clear to auscultation       Cardiovascular negative cardio ROS   Rhythm:Regular Rate:Normal     Neuro/Psych negative neurological ROS  negative psych ROS   GI/Hepatic negative GI ROS, Neg liver ROS,   Endo/Other  Hypothyroidism   Renal/GU negative Renal ROS  negative genitourinary   Musculoskeletal   Abdominal   Peds  Hematology negative hematology ROS (+)   Anesthesia Other Findings   Reproductive/Obstetrics negative OB ROS                            Anesthesia Physical Anesthesia Plan  ASA: II  Anesthesia Plan: General   Post-op Pain Management:    Induction: Intravenous  PONV Risk Score and Plan: 4 or greater and Ondansetron, Dexamethasone and Treatment may vary due to age or medical condition  Airway Management Planned: LMA  Additional Equipment:   Intra-op Plan:   Post-operative Plan: Extubation in OR  Informed Consent: I have reviewed the patients History and Physical, chart, labs and discussed the procedure including the risks, benefits and alternatives for the proposed anesthesia with the patient or authorized representative who has indicated his/her understanding and acceptance.     Dental advisory given  Plan Discussed with: CRNA  Anesthesia Plan Comments:        Anesthesia Quick Evaluation

## 2018-02-28 NOTE — Anesthesia Postprocedure Evaluation (Signed)
Anesthesia Post Note  Patient: Kim Krueger  Procedure(s) Performed: LEFT BREAST LUMPECTOMY WITH RADIOACTIVE SEED LOCALIZATION (Left Breast)     Patient location during evaluation: PACU Anesthesia Type: General Level of consciousness: awake and alert Pain management: pain level controlled Vital Signs Assessment: post-procedure vital signs reviewed and stable Respiratory status: spontaneous breathing, nonlabored ventilation and respiratory function stable Cardiovascular status: blood pressure returned to baseline and stable Postop Assessment: no apparent nausea or vomiting Anesthetic complications: no    Last Vitals:  Vitals:   02/28/18 1400 02/28/18 1425  BP: (!) 164/63 (!) 171/69  Pulse: 78 75  Resp: 16 18  Temp:  36.6 C  SpO2: 96% 96%    Last Pain:  Vitals:   02/28/18 1425  TempSrc: Oral  PainSc: 0-No pain                 Brennan Bailey

## 2018-02-28 NOTE — Anesthesia Procedure Notes (Signed)
Procedure Name: LMA Insertion Performed by: Maycol Hoying M, CRNA Pre-anesthesia Checklist: Patient identified, Emergency Drugs available, Suction available, Patient being monitored and Timeout performed Patient Re-evaluated:Patient Re-evaluated prior to induction Oxygen Delivery Method: Circle system utilized Preoxygenation: Pre-oxygenation with 100% oxygen Induction Type: IV induction LMA: LMA inserted LMA Size: 4.0 Number of attempts: 1 Placement Confirmation: positive ETCO2,  CO2 detector and breath sounds checked- equal and bilateral Tube secured with: Tape Dental Injury: Teeth and Oropharynx as per pre-operative assessment        

## 2018-02-28 NOTE — Transfer of Care (Signed)
Immediate Anesthesia Transfer of Care Note  Patient: Kim Krueger  Procedure(s) Performed: LEFT BREAST LUMPECTOMY WITH RADIOACTIVE SEED LOCALIZATION (Left Breast)  Patient Location: PACU  Anesthesia Type:General  Level of Consciousness: awake, alert  and oriented  Airway & Oxygen Therapy: Patient Spontanous Breathing and Patient connected to face mask oxygen  Post-op Assessment: Report given to RN and Post -op Vital signs reviewed and stable  Post vital signs: Reviewed and stable  Last Vitals:  Vitals Value Taken Time  BP    Temp    Pulse 80 02/28/2018  1:38 PM  Resp 13 02/28/2018  1:38 PM  SpO2 96 % 02/28/2018  1:38 PM  Vitals shown include unvalidated device data.  Last Pain:  Vitals:   02/28/18 0948  TempSrc: Oral  PainSc: 0-No pain      Patients Stated Pain Goal: 0 (12/07/60 4469)  Complications: No apparent anesthesia complications

## 2018-02-28 NOTE — Interval H&P Note (Signed)
History and Physical Interval Note:  02/28/2018 12:10 PM  Kim Krueger  has presented today for surgery, with the diagnosis of LEFT BREAST CANCER  The various methods of treatment have been discussed with the patient and family. After consideration of risks, benefits and other options for treatment, the patient has consented to  Procedure(s): LEFT BREAST LUMPECTOMY WITH RADIOACTIVE SEED LOCALIZATION (Left) as a surgical intervention .  The patient's history has been reviewed, patient examined, no change in status, stable for surgery.  I have reviewed the patient's chart and labs.  Questions were answered to the patient's satisfaction.     Darene Lamer Christy Friede

## 2018-02-28 NOTE — Discharge Instructions (Signed)
[facility] °Office Phone Number [phone] ° °BREAST BIOPSY/ PARTIAL MASTECTOMY: POST OP INSTRUCTIONS ° °Always review your discharge instruction sheet given to you by the facility where your surgery was performed. ° °IF YOU HAVE DISABILITY OR FAMILY LEAVE FORMS, YOU MUST BRING THEM TO THE OFFICE FOR PROCESSING.  DO NOT GIVE THEM TO YOUR DOCTOR. ° °1. A prescription for pain medication may be given to you upon discharge.  Take your pain medication as prescribed, if needed.  If narcotic pain medicine is not needed, then you may take acetaminophen (Tylenol) or ibuprofen (Advil) as needed. °2. Take your usually prescribed medications unless otherwise directed °3. If you need a refill on your pain medication, please contact your pharmacy.  They will contact our office to request authorization.  Prescriptions will not be filled after 5pm or on week-ends. °4. You should eat very light the first 24 hours after surgery, such as soup, crackers, pudding, etc.  Resume your normal diet the day after surgery. °5. Most patients will experience some swelling and bruising in the breast.  Ice packs and a good support bra will help.  Swelling and bruising can take several days to resolve.  °6. It is common to experience some constipation if taking pain medication after surgery.  Increasing fluid intake and taking a stool softener will usually help or prevent this problem from occurring.  A mild laxative (Milk of Magnesia or Miralax) should be taken according to package directions if there are no bowel movements after 48 hours. °7. Unless discharge instructions indicate otherwise, you may remove your bandages 24-48 hours after surgery, and you may shower at that time.  You may have steri-strips (small skin tapes) in place directly over the incision.  These strips should be left on the skin for 7-10 days.  If your surgeon used skin glue on the incision, you may shower in 24 hours.  The glue will flake off over the  next 2-3 weeks.  Any sutures or staples will be removed at the office during your follow-up visit. °8. ACTIVITIES:  You may resume regular daily activities (gradually increasing) beginning the next day.  Wearing a good support bra or sports bra minimizes pain and swelling.  You may have sexual intercourse when it is comfortable. °a. You may drive when you no longer are taking prescription pain medication, you can comfortably wear a seatbelt, and you can safely maneuver your car and apply brakes. °b. RETURN TO WORK:  ______________________________________________________________________________________ °9. You should see your doctor in the office for a follow-up appointment approximately two weeks after your surgery.  Your doctor’s nurse will typically make your follow-up appointment when she calls you with your pathology report.  Expect your pathology report 2-3 business days after your surgery.  You may call to check if you do not hear from us after three days. °10. OTHER INSTRUCTIONS: _______________________________________________________________________________________________ _____________________________________________________________________________________________________________________________________ °_____________________________________________________________________________________________________________________________________ °_____________________________________________________________________________________________________________________________________ ° °WHEN TO CALL YOUR DOCTOR: °1. Fever over 101.0 °2. Nausea and/or vomiting. °3. Extreme swelling or bruising. °4. Continued bleeding from incision. °5. Increased pain, redness, or drainage from the incision. ° °The clinic staff is available to answer your questions during regular business hours.  Please don’t hesitate to call and ask to speak to one of the nurses for clinical concerns.  If you have a medical emergency, go to the nearest  emergency room or call 911.  A surgeon from [facility] is always on call at the hospital. ° °For further questions, please visit [email or web address]  ° ° ° ° °  Post Anesthesia Home Care Instructions ° °Activity: °Get plenty of rest for the remainder of the day. A responsible individual must stay with you for 24 hours following the procedure.  °For the next 24 hours, DO NOT: °-Drive a car °-Operate machinery °-Drink alcoholic beverages °-Take any medication unless instructed by your physician °-Make any legal decisions or sign important papers. ° °Meals: °Start with liquid foods such as gelatin or soup. Progress to regular foods as tolerated. Avoid greasy, spicy, heavy foods. If nausea and/or vomiting occur, drink only clear liquids until the nausea and/or vomiting subsides. Call your physician if vomiting continues. ° °Special Instructions/Symptoms: °Your throat may feel dry or sore from the anesthesia or the breathing tube placed in your throat during surgery. If this causes discomfort, gargle with warm salt water. The discomfort should disappear within 24 hours. ° °If you had a scopolamine patch placed behind your ear for the management of post- operative nausea and/or vomiting: ° °1. The medication in the patch is effective for 72 hours, after which it should be removed.  Wrap patch in a tissue and discard in the trash. Wash hands thoroughly with soap and water. °2. You may remove the patch earlier than 72 hours if you experience unpleasant side effects which may include dry mouth, dizziness or visual disturbances. °3. Avoid touching the patch. Wash your hands with soap and water after contact with the patch. °  ° °

## 2018-02-28 NOTE — Op Note (Signed)
Preoperative Diagnosis: LEFT BREAST CANCER  Postoprative Diagnosis: LEFT BREAST CANCER  Procedure: Procedure(s): LEFT BREAST LUMPECTOMY WITH RADIOACTIVE SEED LOCALIZATION   Surgeon: Excell Seltzer T   Assistants: None  Anesthesia:  General LMA anesthesia  Indications: 78 year old female with a new diagnosis of cancer of the left breast, lower inner quadrant. Clinical stage 1A, ER/PR positive and HER-2 negative, 0.7 cm by imaging.  After extensive preoperative discussion detailed elsewhere we have elected to proceed with radioactive seed localized lumpectomy as initial surgical therapy.    Procedure Detail: Patient had previously undergone placement of a radioactive seed at the tumor and clip site in the left breast.  The seed was confirmed in the holding area.  Correct side was marked.  Patient was brought to the operating room, placed in the supine position on the operating table, and laryngeal mask general anesthesia induced.  The entire left breast was widely sterilely prepped and draped.  She received preoperative IV antibiotics.  PAS were in place.  Patient timeout was performed and correct procedure verified. The neoprobe was used to localize the tumor just medial to the areolar border.  A circumareolar incision was used and dissection deepened into the subcutaneous tissue.  Short skin and subcutaneous flap was raised medially overlying the seed.  Using the neoprobe for guidance a globular specimen of breast tissue about 2 cm in diameter was excised with cautery around the area of high counts.  The specimen was inked for margins.  Faxitron CT was obtained showing the tumor and marking clip somewhat closer to the anterior border with all other borders being fine.  I took an additional anterior margin separately which was inked.  Hemostasis was assured.  Soft tissue was infiltrated with Marcaine.  The lumpectomy cavity was marked with clips.  Deep breast and subcutaneous tissue was closed  with interrupted 3-0 Vicryl and the skin with running subcuticular 5-0 Monocryl and Dermabond.  Sponge needle and instrument counts were correct.    Findings: As above  Estimated Blood Loss:  Minimal         Drains: None  Blood Given: none          Specimens: #1 left breast lumpectomy #2 additional anterior margin oriented        Complications:  * No complications entered in OR log *         Disposition: PACU - hemodynamically stable.         Condition: stable

## 2018-03-01 ENCOUNTER — Encounter (HOSPITAL_BASED_OUTPATIENT_CLINIC_OR_DEPARTMENT_OTHER): Payer: Self-pay | Admitting: General Surgery

## 2018-03-12 NOTE — Progress Notes (Signed)
Radiation Oncology         [phone] ________________________________  Name: Kim Krueger        MRN: [id number]  Date of Service: 03/14/2018 DOB: 01-15-1941  CL:EXNTZGY, Fransico Him, MD  Excell Seltzer, MD     REFERRING PHYSICIAN: Excell Seltzer, MD   DIAGNOSIS: The encounter diagnosis was Malignant neoplasm of upper-inner quadrant of left breast in female, estrogen receptor positive ([place]).   HISTORY OF PRESENT ILLNESS: Kim Krueger is a 78 y.o. female seen for a new diagnosis of left breast cancer.  On 12/26/2017, the patient underwent screening mammogram which revealed a 5 mm mass and calcifications in the left breast.  She returned for ultrasound on 01/05/2018 which revealed a 7 mm oval mass with a lobulated margin in the left breast, lower inner quadrant at 9:00.  No comments were made about her axilla.  She underwent stereotactic biopsy on 01/15/2018 which revealed a grade 1-2 invasive ductal carcinoma.  Her tumor was ER/PR positive HER-2 negative with a Ki-67 of 5%. She subsequently underwent left breast lumpectomy on 02/28/2018 revealing no evidence of residual or in situ or invasive carcinoma. She did not have any lymph nodes sampled at the time of surgery, and this had been discussed prior to her surgery in breast conference.  She comes today to discuss options of adjuvant therapies.  She has met with Dr. Lindi Adie who recommended antiestrogen therapy in the adjuvant setting for 5 years.     PREVIOUS RADIATION THERAPY: No   PAST MEDICAL HISTORY:  Past Medical History:  Diagnosis Date  . Cancer [facility])    uterine cancer, had hysterectomy  . Hypothyroidism   . Temporal arteritis ([place])   . Thyroid disease   . Urge incontinence        PAST SURGICAL HISTORY: Past Surgical History:  Procedure Laterality Date  . ABDOMINAL HYSTERECTOMY     ovaries removed as well  . APPENDECTOMY     appendix ruptured as child, still have part of appendix  . ARTERY BIOPSY Right  10/19/2015   Procedure: RIGHT TEMPORAL ARTERY BIOPSY;  Surgeon: Judeth Horn, MD;  Location: [place];  Service: General;  Laterality: Right;  . BREAST LUMPECTOMY WITH RADIOACTIVE SEED LOCALIZATION Left 02/28/2018   Procedure: LEFT BREAST LUMPECTOMY WITH RADIOACTIVE SEED LOCALIZATION;  Surgeon: Excell Seltzer, MD;  Location: [place];  Service: General;  Laterality: Left;  . HERNIA REPAIR  17/4944   umbilical  . INSERTION OF MESH N/A 03/28/2016   Procedure: INSERTION OF MESH;  Surgeon: Judeth Horn, MD;  Location: [place];  Service: General;  Laterality: N/A;  . UMBILICAL HERNIA REPAIR N/A 03/28/2016   Procedure: LAPAROSCOPIC UMBILICAL HERNIA;  Surgeon: Judeth Horn, MD;  Location: [place];  Service: General;  Laterality: N/A;     FAMILY HISTORY: No family history on file.   SOCIAL HISTORY:  reports that she has never smoked. She has never used smokeless tobacco. She reports that she does not drink alcohol or use drugs.  The patient is married and resides in [place]. 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   ALLERGIES: Ceftin [cefuroxime axetil]; Hydrocodone; Sulfa antibiotics; Tramadol; and Penicillins   MEDICATIONS:  Current Outpatient Medications  Medication Sig Dispense Refill  . Cholecalciferol (VITAMIN D) 2000 units CAPS Take 1 capsule by mouth daily.     . Cyanocobalamin (VITAMIN B-12 CR) 1500 MCG TBCR Take 1 tablet by mouth  daily.     . gabapentin (NEURONTIN) 100 MG capsule Take 1 capsule (100 mg total) by mouth 3 (three) times daily. 9 capsule 0  . levothyroxine (SYNTHROID, LEVOTHROID) 88 MCG tablet Take 88 mcg by mouth daily.  1   No current facility-administered medications for this visit.      REVIEW OF SYSTEMS: On review of systems, the patient reports that she is doing well overall. She states she is healing well and is otherwise without complaint.     PHYSICAL EXAM:   Wt Readings from Last 3 Encounters:  02/28/18 179 lb 7.3 oz (81.4 kg)  02/12/18 180 lb 9.6 oz (81.9 kg)  03/21/17 197 lb (89.4 kg)   Temp Readings from Last 3 Encounters:  02/28/18 97.8 F (36.6 C) (Oral)  02/12/18 98.3 F (36.8 C) (Oral)  05/13/16 98.3 F (36.8 C) (Oral)   BP Readings from Last 3 Encounters:  02/28/18 (!) 171/69  02/12/18 138/72  03/21/17 130/70   Pulse Readings from Last 3 Encounters:  02/28/18 75  02/12/18 69  03/21/17 70     In general this is a well appearing Caucasian female in no acute distress. She is alert and oriented x4 and appropriate throughout the examination. HEENT reveals that the patient is normocephalic, atraumatic. EOMs are intact. Skin is intact without any evidence of gross lesions. Cardiopulmonary assessment is negative for acute distress and she exhibits normal effort.  Breast exam reveals a well-healed lumpectomy site in the left breast, no erythema is identified.   ECOG = 0  0 - Asymptomatic (Fully active, able to carry on all predisease activities without restriction)  1 - Symptomatic but completely ambulatory (Restricted in physically strenuous activity but ambulatory and able to carry out work of a light or sedentary nature. For example, light housework, office work)  2 - Symptomatic, <50% in bed during the day (Ambulatory and capable of all self care but unable to carry out any work activities. Up and about more than 50% of waking hours)  3 - Symptomatic, >50% in bed, but not bedbound (Capable of only limited self-care, confined to bed or chair 50% or more of waking hours)  4 - Bedbound (Completely disabled. Cannot carry on any self-care. Totally confined to bed or chair)  5 - Death   Eustace Pen MM, Creech RH, Tormey DC, et al. [phone]). "Toxicity and response criteria of the [facility]". [place] Oncol. 5 (6): 649-55    LABORATORY DATA:  Lab Results  Component Value Date   WBC 9.5 03/25/2016   HGB  10.8 (L) 03/25/2016   HCT 36.1 03/25/2016   MCV 83.8 03/25/2016   PLT 291 03/25/2016   Lab Results  Component Value Date   NA 141 03/25/2016   K 4.2 03/25/2016   CL 109 03/25/2016   CO2 24 03/25/2016   Lab Results  Component Value Date   ALT 13 (L) 03/09/2016   AST 16 03/09/2016   ALKPHOS 76 03/09/2016   BILITOT 0.5 03/09/2016      RADIOGRAPHY: No results found.     IMPRESSION/PLAN: 1. Probable Stage IA, [id number] grade 1-2 ER/PR positive invasive ductal carcinoma of the left breast. Dr. Lisbeth Renshaw discusses the pathology findings and reviews the nature of left breast disease. We discussed the rationale for considering adjuvant radiotherapy in patients who have lumpectomy, and we outlined the role is to reduce the risk of local recurrence in the breast. We reviewed special populations in which radiation can be omitted. Her  tumor was grade 1-2, and this does play a role in the discussion as does her age. Dr. Lisbeth Renshaw reviews the trials in detail that led to the option to omit treatment, but also details the trials supporting the role of radiotherapy, as well as the rationale to treat based on preoperative findings. She also has plans to proceed with adjuvant  antiestrogen therapy and will meet with Dr. Lindi Adie regarding this as well this afternoon. We discussed the risks, benefits, short, and long term effects of radiotherapy, and the patient would like to think about our discussion prior to making a decision. Dr. Lisbeth Renshaw discusses the delivery and logistics of radiotherapy and anticipates a course of 4 weeks of radiotherapy with deep inspiration breath hold technique. We will follow up with her next week if she hasn't contacted me sooner. I gave her my card with information on how to contact us. 2. Possible genetic predisposition to malignancy. The patient is a candidate for genetic testing given her  personal and family history. She was offered referral previously and is to meet with genetic  counseling this afternoon.   In a visit lasting 60 minutes, greater than 50% of the time was spent face to face discussing her case, and coordinating the patient's care.  The above documentation reflects my direct findings during this shared patient visit. Please see the separate note by Dr. Lisbeth Renshaw on this date for the remainder of the patient's plan of care.    Carola Rhine, PAC

## 2018-03-13 ENCOUNTER — Encounter: Payer: Self-pay | Admitting: Genetic Counselor

## 2018-03-13 DIAGNOSIS — M25562 Pain in left knee: Secondary | ICD-10-CM | POA: Diagnosis not present

## 2018-03-13 NOTE — Progress Notes (Signed)
Patient Care Team: Tisovec, Fransico Him, MD as PCP - General (Internal Medicine) Lorelle Gibbs, MD as Radiologist (Radiology) [name], NP as Nurse Practitioner (Obstetrics and Gynecology)  DIAGNOSIS:    ICD-10-CM   1. Malignant neoplasm of upper-inner quadrant of left breast in female, estrogen receptor positive ([place]) C50.212    Z17.0     SUMMARY OF ONCOLOGIC HISTORY:   Malignant neoplasm of upper-inner quadrant of left breast in female, estrogen receptor positive ([place])   01/15/2018 Initial Diagnosis    Screening detected left breast calcifications 0.5 cm 9 o'clock position 7 cm from the nipple.  Biopsy revealed IDC grade 1 through 2 with low-grade DCIS    02/28/2018 Surgery    Left lumpectomy: No residual in situ or invasive cancer seen.    03/14/2018 -  Anti-estrogen oral therapy    Anastrozole 1mg  daily     CHIEF COMPLIANT:  Follow-up s/p lumpectomy to review pathology  INTERVAL HISTORY: Kim Krueger is a 78 y.o. with above-mentioned history of left breast cancer. She had a lumpectomy on 02/28/18 for which pathology showed no evidence of residual carcinoma. She presents to the clinic today with her husband to discuss further treatment options and decide whether to proceed with radiation and anti-estrogen therapy. She has a history of osteopenia and takes calcium and Vitamin D. She is interested in trying anastrozole for a month to see if side effects are bad and then deciding about radiation. She is concerned about genetic testing, due to a history of colon cancer in her mother and ovarian cancer on her father's side, in addition to her past endometrial cancer. She reviewed her medication list with me.   REVIEW OF SYSTEMS:   Constitutional: Denies fevers, chills or abnormal weight loss Eyes: Denies blurriness of vision Ears, nose, mouth, throat, and face: Denies mucositis or sore throat Respiratory: Denies cough, dyspnea or wheezes Cardiovascular: Denies  palpitation, chest discomfort Gastrointestinal: Denies nausea, heartburn or change in bowel habits Skin: Denies abnormal skin rashes Lymphatics: Denies new lymphadenopathy or easy bruising Neurological: Denies numbness, tingling or new weaknesses Behavioral/Psych: Mood is stable, no new changes  Extremities: No lower extremity edema Breast: denies any pain or lumps or nodules in either breasts All other systems were reviewed with the patient and are negative.  I have reviewed the past medical history, past surgical history, social history and family history with the patient and they are unchanged from previous note.  ALLERGIES:  is allergic to ceftin [cefuroxime axetil]; hydrocodone; sulfa antibiotics; tramadol; and penicillins.  MEDICATIONS:  Current Outpatient Medications  Medication Sig Dispense Refill  . anastrozole (ARIMIDEX) 1 MG tablet Take 1 tablet (1 mg total) by mouth daily. 30 tablet 0  . Cholecalciferol (VITAMIN D) 2000 units CAPS Take 1 capsule by mouth daily.     . clobetasol cream (TEMOVATE) 0.05 % clobetasol 0.05 % topical cream    . Cyanocobalamin (VITAMIN B-12 CR) 1500 MCG TBCR Take 1 tablet by mouth daily.     Marland Kitchen gabapentin (NEURONTIN) 100 MG capsule Take 1 capsule (100 mg total) by mouth 3 (three) times daily. 9 capsule 0  . levothyroxine (SYNTHROID, LEVOTHROID) 88 MCG tablet Take 88 mcg by mouth daily.  1  . MYRBETRIQ 50 MG TB24 tablet      No current facility-administered medications for this visit.     PHYSICAL EXAMINATION: ECOG PERFORMANCE STATUS: 1 - Symptomatic but completely ambulatory  Vitals:   03/14/18 1231  BP: 133/75  Pulse: 86  Resp:  17  Temp: 98.8 F (37.1 C)  SpO2: 98%   Filed Weights   03/14/18 1231  Weight: 181 lb 3.2 oz (82.2 kg)    GENERAL: alert, no distress and comfortable SKIN: skin color, texture, turgor are normal, no rashes or significant lesions EYES: normal, Conjunctiva are pink and non-injected, sclera clear OROPHARYNX: no  exudate, no erythema and lips, buccal mucosa, and tongue normal  NECK: supple, thyroid normal size, non-tender, without nodularity LYMPH: no palpable lymphadenopathy in the cervical, axillary or inguinal LUNGS: clear to auscultation and percussion with normal breathing effort HEART: regular rate & rhythm and no murmurs and no lower extremity edema ABDOMEN: abdomen soft, non-tender and normal bowel sounds MUSCULOSKELETAL: no cyanosis of digits and no clubbing  NEURO: alert & oriented x 3 with fluent speech, no focal motor/sensory deficits EXTREMITIES: No lower extremity edema  LABORATORY DATA:  I have reviewed the data as listed CMP Latest Ref Rng & Units 03/25/2016 03/09/2016 01/21/2016  Glucose 65 - 99 mg/dL 111(H) 104(H) 92  BUN 6 - 20 mg/dL 9 10 15   Creatinine 0.44 - 1.00 mg/dL 0.94 0.95 0.96  Sodium 135 - 145 mmol/L 141 140 140  Potassium 3.5 - 5.1 mmol/L 4.2 4.0 4.4  Chloride 101 - 111 mmol/L 109 106 100  CO2 22 - 32 mmol/L 24 27 21   Calcium 8.9 - 10.3 mg/dL 9.4 9.8 9.4  Total Protein 6.5 - 8.1 g/dL - 6.7 -  Total Bilirubin 0.3 - 1.2 mg/dL - 0.5 -  Alkaline Phos 38 - 126 U/L - 76 -  AST 15 - 41 U/L - 16 -  ALT 14 - 54 U/L - 13(L) -    Lab Results  Component Value Date   WBC 9.5 03/25/2016   HGB 10.8 (L) 03/25/2016   HCT 36.1 03/25/2016   MCV 83.8 03/25/2016   PLT 291 03/25/2016   NEUTROABS 7.6 10/01/2015    ASSESSMENT & PLAN:  Malignant neoplasm of upper-inner quadrant of left breast in female, estrogen receptor positive ([place]) 01/15/2018: Screening detected left breast calcifications 0.5 cm 9 o'clock position 7 cm from the nipple.  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  Treatment plan:  After lengthy discussion between radiation oncology and antiestrogen therapy options, patient decided to try anastrozole 1 mg daily for a month and if she tolerates it well then she  will continue with anastrozole therapy and if she cannot tolerate it well then she would like to get radiation instead.  Anastrozole counseling: We discussed the risks and benefits of anti-estrogen therapy with aromatase inhibitors. These include but not limited to insomnia, hot flashes, mood changes, vaginal dryness, bone density loss, and weight gain. We strongly believe that the benefits far outweigh the risks. Patient understands these risks and consented to starting treatment. Planned treatment duration is 5 years.  Return to clinic in 3 months for survivorship care plan visit    No orders of the defined types were placed in this encounter.  The patient has a good understanding of the overall plan. she agrees with it. she will call with any problems that may develop before the next visit here.  Nicholas Lose, MD 03/14/2018  Julious Oka Dorshimer am acting as scribe for Dr. Nicholas Lose.  I have reviewed the above documentation for accuracy and completeness, and I agree with the above.

## 2018-03-13 NOTE — Progress Notes (Signed)
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  Screening mammogram: abnormality in the left breast with calcifications measuring 5 mm in size.  Histology per Pathology Report: Left breast lumpectomy 02/28/2018  Receptor Status: ER(100%), PR (100%), Her2-neu (-), Ki-67(5%)  Histology per Pathology Report: Left Breast Biopsy 01/15/2018    Past/Anticipated interventions by surgeon, if any: Dr. Excell Seltzer 01/31/2018 -She would be ideal for lumpectomy which is what she prefers. -I would not recommend a sentinel lymph node biopsy based on her age and small low grade tumor which was the consensus in breast cancer conference. -Chemotherapy, hormonal therapy and radiation therapy have been discussed. -She likely would be a candidate for hormonal therapy alone. -Referred to Oncology for evaluation and follow-up. -Radioactive seed localized left breast lumpectomy under general anesthesia as an outpatient. -Lumpectomy 02/28/2018  Past/Anticipated interventions by medical oncology, if any: Chemotherapy  Dr. Lindi Adie 02/12/2018 - Breast conserving surgery -Plus/minus radiation -Adjuvant antiestrogen therapy with anastrozole 49m daily x5 years. -Return to clinic after surgery to discuss the pathology report.  Lymphedema issues, if any: No  Pain issues, if any: No  BP 135/75 (BP Location: Right Wrist, Patient Position: Sitting)   Pulse 86   Temp 98.8 F (37.1 C) (Oral)   Resp 20   Ht _0  (1.6 m)   Wt 181 lb 3.2 oz (82.2 kg)   SpO2 98%   BMI 32.10 kg/m    Wt Readings from Last 3 Encounters:  03/14/18  181 lb 3.2 oz (82.2 kg)  02/28/18 179 lb 7.3 oz (81.4 kg)  02/12/18 180 lb 9.6 oz (81.9 kg)    SAFETY ISSUES:  Prior radiation? No  Pacemaker/ICD? No  Possible current pregnancy? Post menopausal  Is the patient on methotrexate? No  Current Complaints / other details:      SCori Razor RN 03/13/2018,7:43 AM

## 2018-03-14 ENCOUNTER — Inpatient Hospital Stay (HOSPITAL_BASED_OUTPATIENT_CLINIC_OR_DEPARTMENT_OTHER): Payer: Medicare Other | Admitting: Genetic Counselor

## 2018-03-14 ENCOUNTER — Ambulatory Visit
Admission: RE | Admit: 2018-03-14 | Discharge: 2018-03-14 | Disposition: A | Discharge status: Home / Self Care | Payer: Medicare Other | Source: Ambulatory Visit | Attending: Radiation Oncology | Admitting: Radiation Oncology

## 2018-03-14 ENCOUNTER — Encounter: Payer: Self-pay | Admitting: Radiation Oncology

## 2018-03-14 ENCOUNTER — Inpatient Hospital Stay: Payer: Medicare Other | Attending: Hematology and Oncology | Admitting: Hematology and Oncology

## 2018-03-14 ENCOUNTER — Other Ambulatory Visit: Payer: Self-pay

## 2018-03-14 ENCOUNTER — Encounter: Payer: Self-pay | Admitting: Genetic Counselor

## 2018-03-14 ENCOUNTER — Telehealth: Payer: Self-pay | Admitting: Hematology and Oncology

## 2018-03-14 ENCOUNTER — Other Ambulatory Visit: Payer: Self-pay | Admitting: Hematology and Oncology

## 2018-03-14 ENCOUNTER — Inpatient Hospital Stay: Payer: Medicare Other

## 2018-03-14 VITALS — BP 135/75 | HR 86 | Temp 98.8°F | Resp 20 | Ht 63.0 in | Wt 181.2 lb

## 2018-03-14 DIAGNOSIS — Z8542 Personal history of malignant neoplasm of other parts of uterus: Secondary | ICD-10-CM | POA: Diagnosis not present

## 2018-03-14 DIAGNOSIS — Z79899 Other long term (current) drug therapy: Secondary | ICD-10-CM | POA: Insufficient documentation

## 2018-03-14 DIAGNOSIS — M8580 Other specified disorders of bone density and structure, unspecified site: Secondary | ICD-10-CM | POA: Insufficient documentation

## 2018-03-14 DIAGNOSIS — Z800 Family history of malignant neoplasm of digestive organs: Secondary | ICD-10-CM

## 2018-03-14 DIAGNOSIS — C50212 Malignant neoplasm of upper-inner quadrant of left female breast: Secondary | ICD-10-CM | POA: Insufficient documentation

## 2018-03-14 DIAGNOSIS — Z170 Estrogen receptor positive status [ER+]: Principal | ICD-10-CM

## 2018-03-14 DIAGNOSIS — Z8041 Family history of malignant neoplasm of ovary: Secondary | ICD-10-CM

## 2018-03-14 DIAGNOSIS — E039 Hypothyroidism, unspecified: Secondary | ICD-10-CM | POA: Insufficient documentation

## 2018-03-14 DIAGNOSIS — Z9889 Other specified postprocedural states: Secondary | ICD-10-CM | POA: Diagnosis not present

## 2018-03-14 DIAGNOSIS — Z90710 Acquired absence of both cervix and uterus: Secondary | ICD-10-CM | POA: Insufficient documentation

## 2018-03-14 DIAGNOSIS — Z8589 Personal history of malignant neoplasm of other organs and systems: Secondary | ICD-10-CM | POA: Diagnosis not present

## 2018-03-14 DIAGNOSIS — Z315 Encounter for genetic counseling: Secondary | ICD-10-CM

## 2018-03-14 HISTORY — DX: Personal history of malignant neoplasm of other parts of uterus: Z85.42

## 2018-03-14 MED ORDER — ANASTROZOLE 1 MG PO TABS: 1.0000 mg | ORAL_TABLET | Freq: Every day | ORAL | 0 refills | Status: DC

## 2018-03-14 NOTE — Assessment & Plan Note (Signed)
01/15/2018: Screening detected left breast calcifications 0.5 cm 9 o'clock position 7 cm from the nipple.  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  Treatment plan: Recommended radiation therapy versus antiestrogen therapy

## 2018-03-14 NOTE — Progress Notes (Signed)
REFERRING PROVIDER: Nicholas Lose, MD [place], [place] [address]  PRIMARY PROVIDER:  Tisovec, Fransico Him, MD  PRIMARY REASON FOR VISIT:  1. Malignant neoplasm of upper-inner quadrant of left breast in female, estrogen receptor positive ([place])   2. Family history of colon cancer   3. Family history of ovarian cancer   4. History of uterine cancer      HISTORY OF PRESENT ILLNESS:   Kim Krueger, a 78 y.o. female, was seen for a [facility] cancer genetics consultation at the request of Dr. Lindi Adie due to a personal and family history of cancer.  Kim Krueger presents to clinic today to discuss the possibility of a hereditary predisposition to cancer, genetic testing, and to further clarify her future cancer risks, as well as potential cancer risks for family members.   In 2003, at the age of 54, Kim Krueger was diagnosed with cancer of the uterus. This was treated with total and complete hysterectomy.  IN December 2019, at the age of 75, Kim Krueger was diagnosed with breast cancer.  The tumor was ER+/PR+/Her2-. This was treated with a lumpectomy in February, and she will start anastrozole this week.    CANCER HISTORY:    Malignant neoplasm of upper-inner quadrant of left breast in female, estrogen receptor positive ([place])   01/15/2018 Initial Diagnosis    Screening detected left breast calcifications 0.5 cm 9 o'clock position 7 cm from the nipple.  Biopsy revealed IDC grade 1 through 2 with low-grade DCIS    02/28/2018 Surgery    Left lumpectomy: No residual in situ or invasive cancer seen.    03/14/2018 -  Anti-estrogen oral therapy    Anastrozole '1mg'$  daily      HORMONAL RISK FACTORS:  Menarche was at age 25.  First live birth at age 68.  OCP use for approximately 1-2 years.  Ovaries intact: no.  Hysterectomy: yes.  Menopausal status: postmenopausal.  HRT use: 0 years. Colonoscopy: yes; normal. Mammogram within the last year: yes. Number of  breast biopsies: 1. Up to date with pelvic exams:  yes. Any excessive radiation exposure in the past:  no  Past Medical History:  Diagnosis Date  . Cancer [facility])    uterine cancer, had hysterectomy  . Family history of colon cancer   . Family history of ovarian cancer   . Hypothyroidism   . Temporal arteritis ([place])   . Thyroid disease   . Urge incontinence     Past Surgical History:  Procedure Laterality Date  . ABDOMINAL HYSTERECTOMY     ovaries removed as well  . APPENDECTOMY     appendix ruptured as child, still have part of appendix  . ARTERY BIOPSY Right 10/19/2015   Procedure: RIGHT TEMPORAL ARTERY BIOPSY;  Surgeon: Judeth Horn, MD;  Location: [place];  Service: General;  Laterality: Right;  . BREAST LUMPECTOMY WITH RADIOACTIVE SEED LOCALIZATION Left 02/28/2018   Procedure: LEFT BREAST LUMPECTOMY WITH RADIOACTIVE SEED LOCALIZATION;  Surgeon: Excell Seltzer, MD;  Location: [place];  Service: General;  Laterality: Left;  . HERNIA REPAIR  09/3816   umbilical  . INSERTION OF MESH N/A 03/28/2016   Procedure: INSERTION OF MESH;  Surgeon: Judeth Horn, MD;  Location: [place];  Service: General;  Laterality: N/A;  . UMBILICAL HERNIA REPAIR N/A 03/28/2016   Procedure: LAPAROSCOPIC UMBILICAL HERNIA;  Surgeon: Judeth Horn, MD;  Location: [place];  Service: General;  Laterality: N/A;    Social History   Socioeconomic  History  . Marital status: Married    Spouse name: Not on file  . Number of children: Not on file  . Years of education: Not on file  . Highest education level: Not on file  Occupational History  . Not on file  Social Needs  . Financial resource strain: Not on file  . Food insecurity:    Worry: Not on file    Inability: Not on file  . Transportation needs:    Medical: No    Non-medical: No  Tobacco Use  . Smoking status: Never Smoker  . Smokeless tobacco: Never Used  Substance and Sexual Activity  . Alcohol use: No  . Drug  use: No  . Sexual activity: Not on file  Lifestyle  . Physical activity:    Days per week: Not on file    Minutes per session: Not on file  . Stress: Not on file  Relationships  . Social connections:    Talks on phone: Not on file    Gets together: Not on file    Attends religious service: Not on file    Active member of club or organization: Not on file    Attends meetings of clubs or organizations: Not on file    Relationship status: Not on file  Other Topics Concern  . Not on file  Social History Narrative  . Not on file     FAMILY HISTORY:  We obtained a detailed, 4-generation family history.  Significant diagnoses are listed below: Family History  Problem Relation Age of Onset  . Colon cancer Mother 3       d. 18  . Heart disease Father   . Ovarian cancer Cousin        died at 40; pat first cousin  . Dementia Maternal Uncle   . Dementia Maternal Grandmother   . Dementia Cousin 72       mat first cousin    The patient has one daughter who is cancer free.  She is an only child.  Both parents are deceased.  The patient's mother had colon cancer at 38 and died at 23.  She had two brothers and a sister who are cancer free.  The maternal grandparents are both deceased.  The patient's father died of heart disease.  He had two sisters who are cancer free.  One sister had a daughter with ovarian cancer and she died at age 29.  The paternal grandparents are deceased.  Kim Krueger is unaware of previous family history of genetic testing for hereditary cancer risks. Patient's ancestors are of possibly English descent. There is no reported Ashkenazi Jewish ancestry. There is no known consanguinity.  GENETIC COUNSELING ASSESSMENT: Kim Krueger is a 78 y.o. female with a personal and family history of cancer which is somewhat suggestive of a hereditary cancer syndrome such as hereditary breast and ovarian cancer syndrome or Lynch syndrome and predisposition to cancer. We,  therefore, discussed and recommended the following at today's visit.   DISCUSSION: We discussed that about 5-10% of breast cancer is hereditary with most cases due to BRCA mutations.  Most cases of uterine cancer are due to Lynch syndrome, although some families also have breast cancer as part of the condition.  Based on the family history she meets criteria for genetic testing.  The family on the maternal side is small and somewhat limited.  We reviewed the characteristics, features and inheritance patterns of hereditary cancer syndromes. We also discussed genetic testing, including  the appropriate family members to test, the process of testing, insurance coverage and turn-around-time for results. We discussed the implications of a negative, positive and/or variant of uncertain significant result. We recommended Kim Krueger pursue genetic testing for the common hereditary cancer gene panel. The Hereditary Gene Panel offered by [facility] includes sequencing and/or deletion duplication testing of the following 47 genes: APC, ATM, AXIN2, BARD1, BMPR1A, BRCA1, BRCA2, BRIP1, CDH1, CDK4, CDKN2A (p14ARF), CDKN2A (p16INK4a), CHEK2, CTNNA1, DICER1, EPCAM (Deletion/duplication testing only), GREM1 (promoter region deletion/duplication testing only), KIT, MEN1, MLH1, MSH2, MSH3, MSH6, MUTYH, NBN, NF1, NHTL1, PALB2, PDGFRA, PMS2, POLD1, POLE, PTEN, RAD50, RAD51C, RAD51D, SDHB, SDHC, SDHD, SMAD4, SMARCA4. STK11, TP53, TSC1, TSC2, and VHL.  The following genes were evaluated for sequence changes only: SDHA and HOXB13 c.251G>A variant only.   Based on Kim Krueger's personal and family history of cancer, she meets medical criteria for genetic testing. Despite that she meets criteria, she may still have an out of pocket cost. We discussed that if her out of pocket cost for testing is over $100, the laboratory will call and confirm whether she wants to proceed with testing.  If the out of pocket cost of testing is less than  $100 she will be billed by the genetic testing laboratory.   PLAN: After considering the risks, benefits, and limitations, Kim Krueger  provided informed consent to pursue genetic testing and the blood sample was sent to [facility] for analysis of the Common hereditary cancer panel. Results should be available within approximately 2-3 weeks' time, at which point they will be disclosed by telephone to Kim Krueger, as will any additional recommendations warranted by these results. Kim Krueger will receive a summary of her genetic counseling visit and a copy of her results once available. This information will also be available in Epic. We encouraged Kim Krueger to remain in contact with cancer genetics annually so that we can continuously update the family history and inform her of any changes in cancer genetics and testing that may be of benefit for her family. Kim Krueger questions were answered to her satisfaction today. Our contact information was provided should additional questions or concerns arise.  Lastly, we encouraged Kim Krueger to remain in contact with cancer genetics annually so that we can continuously update the family history and inform her of any changes in cancer genetics and testing that may be of benefit for this family.   Ms.  Krueger questions were answered to her satisfaction today. Our contact information was provided should additional questions or concerns arise. Thank you for the referral and allowing [place] to share in the care of your patient.   Dellar Traber P. Florene Glen, [place], [facility] Certified Genetic Counselor [email or web address] phone: [phone]  The patient was seen for a total of 45 minutes in face-to-face genetic counseling.  This patient was discussed with Drs. Magrinat, Lindi Adie and/or Burr Medico who agrees with the above.    _______________________________________________________________________ For Office Staff:  Number of people involved in  session: 2 Was an Intern/ student involved with case: no

## 2018-03-14 NOTE — Telephone Encounter (Signed)
Printed medical records for patient. Release FW#[id number]

## 2018-03-15 ENCOUNTER — Telehealth: Payer: Self-pay | Admitting: Hematology and Oncology

## 2018-03-15 ENCOUNTER — Encounter: Payer: Self-pay | Admitting: *Deleted

## 2018-03-15 NOTE — Telephone Encounter (Signed)
Scheduled appt per 2/27sch message - no SCP available in 3 months , scheduled next avail.  Pt aware of appt date and time

## 2018-03-16 DIAGNOSIS — M1712 Unilateral primary osteoarthritis, left knee: Secondary | ICD-10-CM | POA: Insufficient documentation

## 2018-03-16 HISTORY — DX: Unilateral primary osteoarthritis, left knee: M17.12

## 2018-03-19 ENCOUNTER — Encounter: Payer: Self-pay | Admitting: Radiation Oncology

## 2018-03-19 NOTE — Progress Notes (Signed)
I saw the patient in the clinic area of Dr. Geralyn Flash clinic after she met with him. She's decided to proceed with antiestrogen therapy and forgo radiotherapy. We will be available as needed moving forward.     Carola Rhine, PAC

## 2018-03-30 ENCOUNTER — Encounter: Payer: Self-pay | Admitting: Genetic Counselor

## 2018-03-30 ENCOUNTER — Ambulatory Visit: Payer: Self-pay | Admitting: Genetic Counselor

## 2018-03-30 DIAGNOSIS — Z1379 Encounter for other screening for genetic and chromosomal anomalies: Secondary | ICD-10-CM

## 2018-03-30 HISTORY — DX: Encounter for other screening for genetic and chromosomal anomalies: Z13.79

## 2018-03-30 NOTE — Progress Notes (Addendum)
HPI:  Kim Krueger was previously seen in the [facility] clinic due to a personal and family history of cancer and concerns regarding a hereditary predisposition to cancer. Please refer to our prior cancer genetics clinic note for more information regarding our discussion, assessment and recommendations, at the time. Kim Krueger recent genetic test results were disclosed to her, as were recommendations warranted by these results. These results and recommendations are discussed in more detail below.  CANCER HISTORY:    Malignant neoplasm of upper-inner quadrant of left breast in female, estrogen receptor positive ([place])   01/15/2018 Initial Diagnosis    Screening detected left breast calcifications 0.5 cm 9 o'clock position 7 cm from the nipple.  Biopsy revealed IDC grade 1 through 2 with low-grade DCIS    02/28/2018 Surgery    Left lumpectomy: No residual in situ or invasive cancer seen.    03/14/2018 -  Anti-estrogen oral therapy    Anastrozole 33m daily    03/27/2018 Genetic Testing    DICER1 VUS identified on the common hereditary cancer panel.  The Hereditary Gene Panel offered by [facility] includes sequencing and/or deletion duplication testing of the following 47 genes: APC, ATM, AXIN2, BARD1, BMPR1A, BRCA1, BRCA2, BRIP1, CDH1, CDK4, CDKN2A (p14ARF), CDKN2A (p16INK4a), CHEK2, CTNNA1, DICER1, EPCAM (Deletion/duplication testing only), GREM1 (promoter region deletion/duplication testing only), KIT, MEN1, MLH1, MSH2, MSH3, MSH6, MUTYH, NBN, NF1, NHTL1, PALB2, PDGFRA, PMS2, POLD1, POLE, PTEN, RAD50, RAD51C, RAD51D, SDHB, SDHC, SDHD, SMAD4, SMARCA4. STK11, TP53, TSC1, TSC2, and VHL.  The following genes were evaluated for sequence changes only: SDHA and HOXB13 c.251G>A variant only. The report date is 03/27/2018.     FAMILY HISTORY:  We obtained a detailed, 4-generation family history.  Significant diagnoses are listed below: Family History  Problem Relation Age of Onset    Colon cancer Mother 769      d. 760  Heart disease Father    Ovarian cancer Cousin        died at 353 pat first cousin   Dementia Maternal Uncle    Dementia Maternal Grandmother    Dementia Cousin 552      mat first cousin    The patient has one daughter who is cancer free.  She is an only child.  Both parents are deceased.   The patient's mother had colon cancer at 736and died at 753  She had two brothers and a sister who are cancer free.  The maternal grandparents are both deceased.   The patient's father died of heart disease.  He had two sisters who are cancer free.  One sister had a daughter with ovarian cancer and she died at age 76  The paternal grandparents are deceased.   Ms. SDeliois unaware of previous family history of genetic testing for hereditary cancer risks. Patient's ancestors are of possibly English descent. There is no reported Ashkenazi Jewish ancestry. There is no known consanguinity.  GENETIC TEST RESULTS: Genetic testing reported out on March 27, 2018 through the common hereditary cancer panel found no pathogenic mutations. The Hereditary Gene Panel offered by [facility] includes sequencing and/or deletion duplication testing of the following 47 genes: APC, ATM, AXIN2, BARD1, BMPR1A, BRCA1, BRCA2, BRIP1, CDH1, CDK4, CDKN2A (p14ARF), CDKN2A (p16INK4a), CHEK2, CTNNA1, DICER1, EPCAM (Deletion/duplication testing only), GREM1 (promoter region deletion/duplication testing only), KIT, MEN1, MLH1, MSH2, MSH3, MSH6, MUTYH, NBN, NF1, NHTL1, PALB2, PDGFRA, PMS2, POLD1, POLE, PTEN, RAD50, RAD51C, RAD51D, SDHB, SDHC, SDHD, SMAD4, SMARCA4. STK11, TP53, TSC1, TSC2,  and VHL.  The following genes were evaluated for sequence changes only: SDHA and HOXB13 c.251G>A variant only. The test report has been scanned into EPIC and is located under the Molecular Pathology section of the Results Review tab.  A portion of the result report is included below for reference.     We discussed with  Kim Krueger that because current genetic testing is not perfect, it is possible there may be a gene mutation in one of these genes that current testing cannot detect, but that chance is small.  We also discussed, that there could be another gene that has not yet been discovered, or that we have not yet tested, that is responsible for the cancer diagnoses in the family. It is also possible there is a hereditary cause for the cancer in the family that Kim Krueger did not inherit and therefore was not identified in her testing.  Therefore, it is important to remain in touch with cancer genetics in the future so that we can continue to offer Kim Krueger the most up to date genetic testing.   Genetic testing did identify a variant of uncertain significance (VUS) was identified in the DICER1 gene called c.1468C>T.  At this time, it is unknown if this variant is associated with increased cancer risk or if this is a normal finding, but most variants such as this get reclassified to being inconsequential. It should not be used to make medical management decisions. With time, we suspect the lab will determine the significance of this variant, if any. If we do learn more about it, we will try to contact Kim Krueger to discuss it further. However, it is important to stay in touch with [place] periodically and keep the address and phone number up to date.  UPDATE: DICER1 c.1468C>T (p.Arg490Cys) has been reclassified to Likely Benign.  The amended report date is October 05, 2020.  CANCER SCREENING RECOMMENDATIONS: Kim Krueger test result is considered negative (normal).  This means that we have not identified a hereditary cause for her personal and family history of cancer at this time. Most cancers happen by chance and this negative test suggests that her cancer may fall into this category.    While reassuring, this does not definitively rule out a hereditary predisposition to cancer. It is still possible that  there could be genetic mutations that are undetectable by current technology. There could be genetic mutations in genes that have not been tested or identified to increase cancer risk.  Therefore, it is recommended she continue to follow the cancer management and screening guidelines provided by her oncology and primary healthcare provider.   An individual's cancer risk and medical management are not determined by genetic test results alone. Overall cancer risk assessment incorporates additional factors, including personal medical history, family history, and any available genetic information that may result in a personalized plan for cancer prevention and surveillance  RECOMMENDATIONS FOR FAMILY MEMBERS:  Individuals in this family might be at some increased risk of developing cancer, over the general population risk, simply due to the family history of cancer.  We recommended women in this family have a yearly mammogram beginning at age 58, or 65 years younger than the earliest onset of cancer, an annual clinical breast exam, and perform monthly breast self-exams. Women in this family should also have a gynecological exam as recommended by their primary provider. All family members should have a colonoscopy by age 12.  FOLLOW-UP: Lastly, we discussed with Ms. Pegg that cancer  genetics is a rapidly advancing field and it is possible that new genetic tests will be appropriate for her and/or her family members in the future. We encouraged her to remain in contact with cancer genetics on an annual basis so we can update her personal and family histories and let her know of advances in cancer genetics that may benefit this family.   Our contact number was provided. Ms. Norland questions were answered to her satisfaction, and she knows she is welcome to call us at anytime with additional questions or concerns.   Roma Kayser, MS, [facility] Certified Genetic Counselor [email or web address]

## 2018-04-02 DIAGNOSIS — Z6832 Body mass index (BMI) 32.0-32.9, adult: Secondary | ICD-10-CM | POA: Diagnosis not present

## 2018-04-02 DIAGNOSIS — C50212 Malignant neoplasm of upper-inner quadrant of left female breast: Secondary | ICD-10-CM | POA: Diagnosis not present

## 2018-04-02 DIAGNOSIS — M179 Osteoarthritis of knee, unspecified: Secondary | ICD-10-CM | POA: Diagnosis not present

## 2018-04-19 DIAGNOSIS — M1712 Unilateral primary osteoarthritis, left knee: Secondary | ICD-10-CM | POA: Diagnosis not present

## 2018-06-21 DIAGNOSIS — M179 Osteoarthritis of knee, unspecified: Secondary | ICD-10-CM | POA: Diagnosis not present

## 2018-06-21 DIAGNOSIS — M8580 Other specified disorders of bone density and structure, unspecified site: Secondary | ICD-10-CM | POA: Diagnosis not present

## 2018-06-21 DIAGNOSIS — Z8542 Personal history of malignant neoplasm of other parts of uterus: Secondary | ICD-10-CM | POA: Diagnosis not present

## 2018-06-21 DIAGNOSIS — C50212 Malignant neoplasm of upper-inner quadrant of left female breast: Secondary | ICD-10-CM | POA: Diagnosis not present

## 2018-06-29 DIAGNOSIS — M1712 Unilateral primary osteoarthritis, left knee: Secondary | ICD-10-CM | POA: Diagnosis not present

## 2018-07-05 DIAGNOSIS — M1712 Unilateral primary osteoarthritis, left knee: Secondary | ICD-10-CM | POA: Diagnosis not present

## 2018-07-06 ENCOUNTER — Ambulatory Visit (HOSPITAL_BASED_OUTPATIENT_CLINIC_OR_DEPARTMENT_OTHER): Payer: Medicare Other | Admitting: Hematology and Oncology

## 2018-07-06 DIAGNOSIS — C50212 Malignant neoplasm of upper-inner quadrant of left female breast: Secondary | ICD-10-CM | POA: Diagnosis not present

## 2018-07-06 DIAGNOSIS — Z170 Estrogen receptor positive status [ER+]: Secondary | ICD-10-CM

## 2018-07-06 DIAGNOSIS — Z79811 Long term (current) use of aromatase inhibitors: Secondary | ICD-10-CM

## 2018-07-06 NOTE — Assessment & Plan Note (Signed)
01/15/2018:Screening detected left breast calcifications 0.5 cm 9 o'clock position 7 cm from the nipple. 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  Treatment plan:  Anastrozole 1 mg daily delayed due to family issues and stress She waited to get better from temporal arteritis Her husband had an accident. Had 5 surgeries. Lots of stress with home health. Dont know if they can save his leg.  With all of this stress, we decided not to initiate anti-estrogen therapy at this time. Plan to recheck her in 6 months and decide if that's the right time to start anti-estrogen therapy.  We will recheck in Jan 2021 by a phone call.

## 2018-07-06 NOTE — Progress Notes (Signed)
HEMATOLOGY-ONCOLOGY TELEPHONE VISIT PROGRESS NOTE  I connected with '@PTNAME'$ @ on 07/06/18 at 12:00 PM EDT by telephone and verified that I am speaking with the correct person using two identifiers.  I discussed the limitations, risks, security and privacy concerns of performing an evaluation and management service by telephone and the availability of in person appointments.  I also discussed with the patient that there may be a patient responsible charge related to this service. The patient expressed understanding and agreed to proceed.   History of Present Illness:   Oncology History  Malignant neoplasm of upper-inner quadrant of left breast in female, estrogen receptor positive ([place])  01/15/2018 Initial Diagnosis   Screening detected left breast calcifications 0.5 cm 9 o'clock position 7 cm from the nipple.  Biopsy revealed IDC grade 1 through 2 with low-grade DCIS   02/28/2018 Surgery   Left lumpectomy: No residual in situ or invasive cancer seen.   03/14/2018 -  Anti-estrogen oral therapy   Anastrozole '1mg'$  daily   03/27/2018 Genetic Testing   DICER1 VUS identified on the common hereditary cancer panel.  The Hereditary Gene Panel offered by [facility] includes sequencing and/or deletion duplication testing of the following 47 genes: APC, ATM, AXIN2, BARD1, BMPR1A, BRCA1, BRCA2, BRIP1, CDH1, CDK4, CDKN2A (p14ARF), CDKN2A (p16INK4a), CHEK2, CTNNA1, DICER1, EPCAM (Deletion/duplication testing only), GREM1 (promoter region deletion/duplication testing only), KIT, MEN1, MLH1, MSH2, MSH3, MSH6, MUTYH, NBN, NF1, NHTL1, PALB2, PDGFRA, PMS2, POLD1, POLE, PTEN, RAD50, RAD51C, RAD51D, SDHB, SDHC, SDHD, SMAD4, SMARCA4. STK11, TP53, TSC1, TSC2, and VHL.  The following genes were evaluated for sequence changes only: SDHA and HOXB13 c.251G>A variant only. The report date is 03/27/2018.     REVIEW OF SYSTEMS:   Constitutional: Denies fevers, chills or abnormal weight loss Eyes: Denies blurriness of  vision Ears, nose, mouth, throat, and face: Denies mucositis or sore throat Respiratory: Denies cough, dyspnea or wheezes Cardiovascular: Denies palpitation, chest discomfort Gastrointestinal:  Denies nausea, heartburn or change in bowel habits Skin: Denies abnormal skin rashes Lymphatics: Denies new lymphadenopathy or easy bruising Neurological:Denies numbness, tingling or new weaknesses Behavioral/Psych: Severe stress from her husband's health condition Extremities: No lower extremity edema, arthritis follows with orthopedics Breast:  denies any pain or lumps or nodules in either breasts All other systems were reviewed with the patient and are negative. Observations/Objective:  No clinical findings of disease recurrence   Assessment Plan:  Malignant neoplasm of upper-inner quadrant of left breast in female, estrogen receptor positive ([place]) 01/15/2018:Screening detected left breast calcifications 0.5 cm 9 o'clock position 7 cm from the nipple. 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  Treatment plan:  Anastrozole 1 mg daily delayed due to family issues and stress She waited to get better from temporal arteritis Her husband had an accident. Had 5 surgeries. Lots of stress with home health. Dont know if they can save his leg.  With all of this stress, we decided not to initiate anti-estrogen therapy at this time. Plan to recheck her in 6 months and decide if that's the right time to start anti-estrogen therapy.  We will recheck in Jan 2021 by a phone call.      I discussed the assessment and treatment plan with the patient. The patient was provided an opportunity to ask questions and all were answered. The patient agreed with the plan and demonstrated an understanding of the instructions. The patient was advised to call  back or seek an in-person evaluation if the symptoms worsen  or if the condition fails to improve as anticipated.   I provided 15 minutes of non-face-to-face time during this encounter. Harriette Ohara, MD

## 2018-07-09 ENCOUNTER — Encounter: Payer: Medicare Other | Admitting: Adult Health

## 2018-07-12 DIAGNOSIS — M1712 Unilateral primary osteoarthritis, left knee: Secondary | ICD-10-CM | POA: Diagnosis not present

## 2018-07-12 DIAGNOSIS — M25562 Pain in left knee: Secondary | ICD-10-CM | POA: Diagnosis not present

## 2018-08-02 DIAGNOSIS — R8290 Unspecified abnormal findings in urine: Secondary | ICD-10-CM | POA: Diagnosis not present

## 2018-08-02 DIAGNOSIS — N390 Urinary tract infection, site not specified: Secondary | ICD-10-CM | POA: Diagnosis not present

## 2018-10-04 DIAGNOSIS — R8290 Unspecified abnormal findings in urine: Secondary | ICD-10-CM | POA: Diagnosis not present

## 2018-10-04 DIAGNOSIS — N390 Urinary tract infection, site not specified: Secondary | ICD-10-CM | POA: Diagnosis not present

## 2018-10-30 DIAGNOSIS — N390 Urinary tract infection, site not specified: Secondary | ICD-10-CM | POA: Diagnosis not present

## 2018-10-30 DIAGNOSIS — R350 Frequency of micturition: Secondary | ICD-10-CM | POA: Diagnosis not present

## 2018-11-16 DIAGNOSIS — N390 Urinary tract infection, site not specified: Secondary | ICD-10-CM | POA: Diagnosis not present

## 2018-12-18 DIAGNOSIS — M859 Disorder of bone density and structure, unspecified: Secondary | ICD-10-CM | POA: Diagnosis not present

## 2018-12-18 DIAGNOSIS — E7800 Pure hypercholesterolemia, unspecified: Secondary | ICD-10-CM | POA: Diagnosis not present

## 2018-12-18 DIAGNOSIS — E0500 Thyrotoxicosis with diffuse goiter without thyrotoxic crisis or storm: Secondary | ICD-10-CM | POA: Diagnosis not present

## 2018-12-20 DIAGNOSIS — R82998 Other abnormal findings in urine: Secondary | ICD-10-CM | POA: Diagnosis not present

## 2018-12-25 ENCOUNTER — Encounter: Payer: Self-pay | Admitting: *Deleted

## 2019-01-02 DIAGNOSIS — Z1212 Encounter for screening for malignant neoplasm of rectum: Secondary | ICD-10-CM | POA: Diagnosis not present

## 2019-01-24 DIAGNOSIS — M25562 Pain in left knee: Secondary | ICD-10-CM | POA: Diagnosis not present

## 2019-01-24 DIAGNOSIS — M1712 Unilateral primary osteoarthritis, left knee: Secondary | ICD-10-CM | POA: Diagnosis not present

## 2019-01-30 ENCOUNTER — Encounter: Payer: Self-pay | Admitting: Hematology and Oncology

## 2019-01-30 DIAGNOSIS — R928 Other abnormal and inconclusive findings on diagnostic imaging of breast: Secondary | ICD-10-CM | POA: Diagnosis not present

## 2019-01-30 DIAGNOSIS — M85851 Other specified disorders of bone density and structure, right thigh: Secondary | ICD-10-CM | POA: Diagnosis not present

## 2019-01-30 DIAGNOSIS — Z780 Asymptomatic menopausal state: Secondary | ICD-10-CM | POA: Diagnosis not present

## 2019-01-30 DIAGNOSIS — M85852 Other specified disorders of bone density and structure, left thigh: Secondary | ICD-10-CM | POA: Diagnosis not present

## 2019-01-31 DIAGNOSIS — M25562 Pain in left knee: Secondary | ICD-10-CM | POA: Diagnosis not present

## 2019-01-31 DIAGNOSIS — M1712 Unilateral primary osteoarthritis, left knee: Secondary | ICD-10-CM | POA: Diagnosis not present

## 2019-02-05 DIAGNOSIS — C50912 Malignant neoplasm of unspecified site of left female breast: Secondary | ICD-10-CM | POA: Diagnosis not present

## 2019-02-05 DIAGNOSIS — Z170 Estrogen receptor positive status [ER+]: Secondary | ICD-10-CM | POA: Diagnosis not present

## 2019-02-06 ENCOUNTER — Telehealth: Payer: Self-pay | Admitting: Hematology and Oncology

## 2019-02-06 ENCOUNTER — Telehealth: Payer: Self-pay | Admitting: *Deleted

## 2019-02-06 NOTE — Telephone Encounter (Signed)
RN placed call to pt regarding recent Bone density report showing T score -1.9.  Pt states she is currently taking 2,000 iu vitamin D and 1200 mg calcium daily.  [facility] Bone Density report sent to be scanned into epic.

## 2019-02-06 NOTE — Telephone Encounter (Signed)
Scheduled appt per 1/20 sch message - unable to reach pt . Left message with appt date and time

## 2019-02-07 DIAGNOSIS — M1712 Unilateral primary osteoarthritis, left knee: Secondary | ICD-10-CM | POA: Diagnosis not present

## 2019-02-07 DIAGNOSIS — M25562 Pain in left knee: Secondary | ICD-10-CM | POA: Diagnosis not present

## 2019-05-06 DIAGNOSIS — M316 Other giant cell arteritis: Secondary | ICD-10-CM | POA: Diagnosis not present

## 2019-05-06 DIAGNOSIS — H40013 Open angle with borderline findings, low risk, bilateral: Secondary | ICD-10-CM | POA: Diagnosis not present

## 2019-05-06 DIAGNOSIS — H2513 Age-related nuclear cataract, bilateral: Secondary | ICD-10-CM | POA: Diagnosis not present

## 2019-06-06 DIAGNOSIS — M1711 Unilateral primary osteoarthritis, right knee: Secondary | ICD-10-CM | POA: Diagnosis not present

## 2019-06-06 DIAGNOSIS — M170 Bilateral primary osteoarthritis of knee: Secondary | ICD-10-CM | POA: Diagnosis not present

## 2019-06-06 DIAGNOSIS — M1712 Unilateral primary osteoarthritis, left knee: Secondary | ICD-10-CM | POA: Diagnosis not present

## 2019-06-19 ENCOUNTER — Telehealth: Payer: Self-pay | Admitting: Hematology and Oncology

## 2019-06-19 NOTE — Telephone Encounter (Signed)
Rescheduled appt on 6/18 to 6/24. Provider on pal. Pt aware of appt.

## 2019-07-05 ENCOUNTER — Ambulatory Visit: Payer: Medicare Other | Admitting: Hematology and Oncology

## 2019-07-10 NOTE — Progress Notes (Signed)
Patient Care Team: Tisovec, Fransico Him, MD as PCP - General (Internal Medicine) Lorelle Gibbs, MD as Radiologist (Radiology) [name], NP as Nurse Practitioner (Obstetrics and Gynecology) Nicholas Lose, MD as Consulting Physician (Hematology and Oncology) Excell Seltzer, MD (Inactive) as Consulting Physician (General Surgery)  DIAGNOSIS:    ICD-10-CM   1. Malignant neoplasm of upper-inner quadrant of left breast in female, estrogen receptor positive ([place])  C50.212    Z17.0     SUMMARY OF ONCOLOGIC HISTORY: Oncology History  Malignant neoplasm of upper-inner quadrant of left breast in female, estrogen receptor positive ([place])  01/15/2018 Initial Diagnosis   Screening detected left breast calcifications 0.5 cm 9 o'clock position 7 cm from the nipple.  Biopsy revealed IDC grade 1 through 2 with low-grade DCIS   02/28/2018 Surgery   Left lumpectomy: No residual in situ or invasive cancer seen.   03/14/2018 -  Anti-estrogen oral therapy   Anastrozole 34m daily   03/27/2018 Genetic Testing   DICER1 VUS identified on the common hereditary cancer panel.  The Hereditary Gene Panel offered by [facility] includes sequencing and/or deletion duplication testing of the following 47 genes: APC, ATM, AXIN2, BARD1, BMPR1A, BRCA1, BRCA2, BRIP1, CDH1, CDK4, CDKN2A (p14ARF), CDKN2A (p16INK4a), CHEK2, CTNNA1, DICER1, EPCAM (Deletion/duplication testing only), GREM1 (promoter region deletion/duplication testing only), KIT, MEN1, MLH1, MSH2, MSH3, MSH6, MUTYH, NBN, NF1, NHTL1, PALB2, PDGFRA, PMS2, POLD1, POLE, PTEN, RAD50, RAD51C, RAD51D, SDHB, SDHC, SDHD, SMAD4, SMARCA4. STK11, TP53, TSC1, TSC2, and VHL.  The following genes were evaluated for sequence changes only: SDHA and HOXB13 c.251G>A variant only. The report date is 03/27/2018.     CHIEF COMPLIANT: Follow-up of left breast cancer  INTERVAL HISTORY: Kim HOOKis a 79y.o. with above-mentioned history of left breast cancer who  underwent a lumpectomy. Mammogram on 01/30/19 showed no evidence of malignancy bilaterally. She presents to the clinic today for follow-up to discuss initiation of anti-estrogen therapy with anastrozole.  Her husband has recovered and she is ready to begin antiestrogen therapy  ALLERGIES:  is allergic to ceftin [cefuroxime axetil], hydrocodone, sulfa antibiotics, tramadol, and penicillins.  MEDICATIONS:  Current Outpatient Medications  Medication Sig Dispense Refill  . anastrozole (ARIMIDEX) 1 MG tablet TAKE 1 TABLET(1 MG) BY MOUTH DAILY 90 tablet 0  . Cholecalciferol (VITAMIN D) 2000 units CAPS Take 1 capsule by mouth daily.     . clobetasol cream (TEMOVATE) 0.05 % clobetasol 0.05 % topical cream    . Cyanocobalamin (VITAMIN B-12 CR) 1500 MCG TBCR Take 1 tablet by mouth daily.     .Marland Kitchengabapentin (NEURONTIN) 100 MG capsule Take 1 capsule (100 mg total) by mouth 3 (three) times daily. 9 capsule 0  . levothyroxine (SYNTHROID, LEVOTHROID) 88 MCG tablet Take 88 mcg by mouth daily.  1  . MYRBETRIQ 50 MG TB24 tablet      No current facility-administered medications for this visit.    PHYSICAL EXAMINATION: ECOG PERFORMANCE STATUS: 1 - Symptomatic but completely ambulatory  Vitals:   07/11/19 1406  BP: (!) 160/85  Pulse: 77  Resp: 16  Temp: 98.9 F (37.2 C)  SpO2: 98%   Filed Weights   07/11/19 1406  Weight: 185 lb 11.2 oz (84.2 kg)    BREAST: No palpable masses or nodules in either right or left breasts. No palpable axillary supraclavicular or infraclavicular adenopathy no breast tenderness or nipple discharge. (exam performed in the presence of a chaperone)  LABORATORY DATA:  I have reviewed the data as listed CMP Latest  Ref Rng & Units 03/25/2016 03/09/2016 01/21/2016  Glucose 65 - 99 mg/dL 111(H) 104(H) 92  BUN 6 - 20 mg/dL _0 Creatinine 0.44 - 1.00 mg/dL 0.94 0.95 0.96  Sodium 135 - 145 mmol/L 141 140 140  Potassium 3.5 - 5.1 mmol/L 4.2 4.0 4.4  Chloride 101 - 111 mmol/L 109  106 100  CO2 22 - 32 mmol/L _1 Calcium 8.9 - 10.3 mg/dL 9.4 9.8 9.4  Total Protein 6.5 - 8.1 g/dL - 6.7 -  Total Bilirubin 0.3 - 1.2 mg/dL - 0.5 -  Alkaline Phos 38 - 126 U/L - 76 -  AST 15 - 41 U/L - 16 -  ALT 14 - 54 U/L - 13(L) -    Lab Results  Component Value Date   WBC 9.5 03/25/2016   HGB 10.8 (L) 03/25/2016   HCT 36.1 03/25/2016   MCV 83.8 03/25/2016   PLT 291 03/25/2016   NEUTROABS 7.6 10/01/2015    ASSESSMENT & PLAN:  Malignant neoplasm of upper-inner quadrant of left breast in female, estrogen receptor positive ([place]) 01/15/2018:Screening detected left breast calcifications 0.5 cm 9 o'clock position 7 cm from the nipple. 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  Treatment plan: Anastrozole 1 mg daily delayed due to family issues and stress She waited to get better from temporal arteritis Her husband had an accident. Had 5 surgeries. Lots of stress with home health. Dont know if they can save his leg.  With her husband's health improving, she is ready to start antiestrogen therapy.  I sent a new prescription for anastrozole. Her husband is Data processing manager and enjoys playing the guitar and other instruments.  Patient will call us if she notes any problems with antiestrogen therapy Return to clinic in 3 months with a MyChart virtual visit  No orders of the defined types were placed in this encounter.  The patient has a good understanding of the overall plan. she agrees with it. she will call with any problems that may develop before the next visit here.  Total time spent: 20 mins including face to face time and time spent for planning, charting and coordination of care  Nicholas Lose, MD 07/11/2019  I, Cloyde Reams Dorshimer, am acting as scribe for Dr. Nicholas Lose.  I have reviewed the above documentation for accuracy and completeness, and I agree with  the above.

## 2019-07-11 ENCOUNTER — Other Ambulatory Visit: Payer: Self-pay

## 2019-07-11 ENCOUNTER — Inpatient Hospital Stay: Payer: Medicare Other | Attending: Hematology and Oncology | Admitting: Hematology and Oncology

## 2019-07-11 ENCOUNTER — Telehealth: Payer: Self-pay | Admitting: Hematology and Oncology

## 2019-07-11 DIAGNOSIS — C50212 Malignant neoplasm of upper-inner quadrant of left female breast: Secondary | ICD-10-CM | POA: Insufficient documentation

## 2019-07-11 DIAGNOSIS — Z170 Estrogen receptor positive status [ER+]: Secondary | ICD-10-CM | POA: Diagnosis not present

## 2019-07-11 MED ORDER — ANASTROZOLE 1 MG PO TABS: 1.0000 mg | ORAL_TABLET | Freq: Every day | ORAL | 3 refills | Status: DC

## 2019-07-11 NOTE — Assessment & Plan Note (Signed)
01/15/2018:Screening detected left breast calcifications 0.5 cm 9 o'clock position 7 cm from the nipple. 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  Treatment plan: Anastrozole 1 mg daily delayed due to family issues and stress She waited to get better from temporal arteritis Her husband had an accident. Had 5 surgeries. Lots of stress with home health. Dont know if they can save his leg.  With all of this stress, we decided not to initiate anti-estrogen therapy at this time.

## 2019-07-11 NOTE — Telephone Encounter (Signed)
No 6/24 los, no changes made to patient schedule

## 2019-08-13 DIAGNOSIS — M25562 Pain in left knee: Secondary | ICD-10-CM | POA: Diagnosis not present

## 2019-08-13 DIAGNOSIS — M1712 Unilateral primary osteoarthritis, left knee: Secondary | ICD-10-CM | POA: Diagnosis not present

## 2019-08-20 DIAGNOSIS — M1712 Unilateral primary osteoarthritis, left knee: Secondary | ICD-10-CM | POA: Diagnosis not present

## 2019-08-20 DIAGNOSIS — M25562 Pain in left knee: Secondary | ICD-10-CM | POA: Diagnosis not present

## 2019-08-27 DIAGNOSIS — M25562 Pain in left knee: Secondary | ICD-10-CM | POA: Diagnosis not present

## 2019-12-19 DIAGNOSIS — Z23 Encounter for immunization: Secondary | ICD-10-CM | POA: Diagnosis not present

## 2020-02-04 DIAGNOSIS — E7800 Pure hypercholesterolemia, unspecified: Secondary | ICD-10-CM | POA: Diagnosis not present

## 2020-02-04 DIAGNOSIS — M859 Disorder of bone density and structure, unspecified: Secondary | ICD-10-CM | POA: Diagnosis not present

## 2020-02-06 DIAGNOSIS — Z853 Personal history of malignant neoplasm of breast: Secondary | ICD-10-CM | POA: Diagnosis not present

## 2020-02-13 DIAGNOSIS — R82998 Other abnormal findings in urine: Secondary | ICD-10-CM | POA: Diagnosis not present

## 2020-02-13 DIAGNOSIS — Z1212 Encounter for screening for malignant neoplasm of rectum: Secondary | ICD-10-CM | POA: Diagnosis not present

## 2020-02-13 DIAGNOSIS — E0500 Thyrotoxicosis with diffuse goiter without thyrotoxic crisis or storm: Secondary | ICD-10-CM | POA: Diagnosis not present

## 2020-02-13 DIAGNOSIS — R1314 Dysphagia, pharyngoesophageal phase: Secondary | ICD-10-CM | POA: Diagnosis not present

## 2020-02-13 DIAGNOSIS — C50212 Malignant neoplasm of upper-inner quadrant of left female breast: Secondary | ICD-10-CM | POA: Diagnosis not present

## 2020-02-13 DIAGNOSIS — Z0000 Encounter for general adult medical examination without abnormal findings: Secondary | ICD-10-CM | POA: Diagnosis not present

## 2020-03-12 DIAGNOSIS — C50912 Malignant neoplasm of unspecified site of left female breast: Secondary | ICD-10-CM | POA: Diagnosis not present

## 2020-03-12 DIAGNOSIS — Z170 Estrogen receptor positive status [ER+]: Secondary | ICD-10-CM | POA: Diagnosis not present

## 2020-05-06 DIAGNOSIS — H40013 Open angle with borderline findings, low risk, bilateral: Secondary | ICD-10-CM | POA: Diagnosis not present

## 2020-05-06 DIAGNOSIS — H25813 Combined forms of age-related cataract, bilateral: Secondary | ICD-10-CM | POA: Diagnosis not present

## 2020-05-06 DIAGNOSIS — M316 Other giant cell arteritis: Secondary | ICD-10-CM | POA: Diagnosis not present

## 2020-05-07 DIAGNOSIS — Z23 Encounter for immunization: Secondary | ICD-10-CM | POA: Diagnosis not present

## 2020-06-23 ENCOUNTER — Inpatient Hospital Stay: Payer: Medicare Other | Attending: Hematology and Oncology | Admitting: Hematology and Oncology

## 2020-06-23 DIAGNOSIS — C50212 Malignant neoplasm of upper-inner quadrant of left female breast: Secondary | ICD-10-CM | POA: Insufficient documentation

## 2020-06-23 DIAGNOSIS — Z170 Estrogen receptor positive status [ER+]: Secondary | ICD-10-CM | POA: Diagnosis not present

## 2020-06-23 MED ORDER — ANASTROZOLE 1 MG PO TABS: 1.0000 mg | ORAL_TABLET | Freq: Every day | ORAL | 3 refills | Status: DC

## 2020-06-23 NOTE — Assessment & Plan Note (Signed)
01/15/2018:Screening detected left breast calcifications 0.5 cm 9 o'clock position 7 cm from the nipple. 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  Treatment plan:Anastrozole 1 mg daily delayed due to family issues and stress.  Started 07/11/2019 She waited to get better from temporal arteritis Her husband had an accident. Had 5 surgeries. Lots of stress with home health. Dont know if they can save his leg.  Anastrozole toxicities: Denies any adverse effects to anastrozole therapy.  Breast cancer surveillance: 1.  Breast exam 06/23/2020: Benign 2. mammogram 02/06/2020: [facility]: Benign  Return to clinic in 1 year for follow-up

## 2020-06-23 NOTE — Progress Notes (Signed)
Patient Care Team: Tisovec, Fransico Him, MD as PCP - General (Internal Medicine) Lorelle Gibbs, MD as Radiologist (Radiology) [name], NP as Nurse Practitioner (Obstetrics and Gynecology) Nicholas Lose, MD as Consulting Physician (Hematology and Oncology) Excell Seltzer, MD (Inactive) as Consulting Physician (General Surgery)  DIAGNOSIS:  Encounter Diagnosis  Name Primary?  . Malignant neoplasm of upper-inner quadrant of left breast in female, estrogen receptor positive ([place])     SUMMARY OF ONCOLOGIC HISTORY: Oncology History  Malignant neoplasm of upper-inner quadrant of left breast in female, estrogen receptor positive ([place])  01/15/2018 Initial Diagnosis   Screening detected left breast calcifications 0.5 cm 9 o'clock position 7 cm from the nipple.  Biopsy revealed IDC grade 1 through 2 with low-grade DCIS   02/28/2018 Surgery   Left lumpectomy: No residual in situ or invasive cancer seen.   03/27/2018 Genetic Testing   DICER1 VUS identified on the common hereditary cancer panel.  The Hereditary Gene Panel offered by [facility] includes sequencing and/or deletion duplication testing of the following 47 genes: APC, ATM, AXIN2, BARD1, BMPR1A, BRCA1, BRCA2, BRIP1, CDH1, CDK4, CDKN2A (p14ARF), CDKN2A (p16INK4a), CHEK2, CTNNA1, DICER1, EPCAM (Deletion/duplication testing only), GREM1 (promoter region deletion/duplication testing only), KIT, MEN1, MLH1, MSH2, MSH3, MSH6, MUTYH, NBN, NF1, NHTL1, PALB2, PDGFRA, PMS2, POLD1, POLE, PTEN, RAD50, RAD51C, RAD51D, SDHB, SDHC, SDHD, SMAD4, SMARCA4. STK11, TP53, TSC1, TSC2, and VHL.  The following genes were evaluated for sequence changes only: SDHA and HOXB13 c.251G>A variant only. The report date is 03/27/2018.   06/2019 -  Anti-estrogen oral therapy   Anastrozole 1mg  daily     CHIEF COMPLIANT: Follow-up on anastrozole therapy  INTERVAL HISTORY: Kim Krueger is a 80 year old with above-mentioned history of left breast cancer  underwent left lumpectomy and is currently on oral antiestrogen therapy with anastrozole.  Anastrozole was delayed because of family reasons and it was just started June 2021.   ALLERGIES:  is allergic to ceftin [cefuroxime axetil], hydrocodone, sulfa antibiotics, tramadol, and penicillins.  MEDICATIONS:  Current Outpatient Medications  Medication Sig Dispense Refill  . anastrozole (ARIMIDEX) 1 MG tablet Take 1 tablet (1 mg total) by mouth daily. 90 tablet 3  . Cholecalciferol (VITAMIN D) 2000 units CAPS Take 1 capsule by mouth daily.     . clobetasol cream (TEMOVATE) 0.05 % clobetasol 0.05 % topical cream    . Cyanocobalamin (VITAMIN B-12 CR) 1500 MCG TBCR Take 1 tablet by mouth daily.     Marland Kitchen levothyroxine (SYNTHROID, LEVOTHROID) 88 MCG tablet Take 88 mcg by mouth daily.  1  . MYRBETRIQ 50 MG TB24 tablet      No current facility-administered medications for this visit.    PHYSICAL EXAMINATION: ECOG PERFORMANCE STATUS: 1 - Symptomatic but completely ambulatory  Vitals:   06/23/20 1300  BP: (!) 158/85  Pulse: 63  Resp: 18  Temp: 98 F (36.7 C)  SpO2: 100%   Filed Weights   06/23/20 1300  Weight: 178 lb 6.4 oz (80.9 kg)    BREAST: No palpable masses or nodules in either right or left breasts. No palpable axillary supraclavicular or infraclavicular adenopathy no breast tenderness or nipple discharge. (exam performed in the presence of a chaperone)  LABORATORY DATA:  I have reviewed the data as listed CMP Latest Ref Rng & Units 03/25/2016 03/09/2016 01/21/2016  Glucose 65 - 99 mg/dL 111(H) 104(H) 92  BUN 6 - 20 mg/dL 9 10 15   Creatinine 0.44 - 1.00 mg/dL 0.94 0.95 0.96  Sodium 135 - 145 mmol/L 141  140 140  Potassium 3.5 - 5.1 mmol/L 4.2 4.0 4.4  Chloride 101 - 111 mmol/L 109 106 100  CO2 22 - 32 mmol/L $RemoveB'24 27 21  'FuOiChMq$ Calcium 8.9 - 10.3 mg/dL 9.4 9.8 9.4  Total Protein 6.5 - 8.1 g/dL - 6.7 -  Total Bilirubin 0.3 - 1.2 mg/dL - 0.5 -  Alkaline Phos 38 - 126 U/L - 76 -  AST 15 - 41  U/L - 16 -  ALT 14 - 54 U/L - 13(L) -    Lab Results  Component Value Date   WBC 9.5 03/25/2016   HGB 10.8 (L) 03/25/2016   HCT 36.1 03/25/2016   MCV 83.8 03/25/2016   PLT 291 03/25/2016   NEUTROABS 7.6 10/01/2015    ASSESSMENT & PLAN:  Malignant neoplasm of upper-inner quadrant of left breast in female, estrogen receptor positive ([place]) 01/15/2018:Screening detected left breast calcifications 0.5 cm 9 o'clock position 7 cm from the nipple. 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  Treatment plan:Anastrozole 1 mg daily delayed due to family issues and stress.  Started 07/11/2019 She waited to get better from temporal arteritis Her husband had an accident. Had 5 surgeries. Lots of stress with home health. Dont know if they can save his leg.  Anastrozole toxicities: Denies any adverse effects to anastrozole therapy.  Breast cancer surveillance: 1.  Breast exam 06/23/2020: Benign 2. mammogram 02/06/2020: [facility]: Benign  Return to clinic in 1 year for follow-up    No orders of the defined types were placed in this encounter.  The patient has a good understanding of the overall plan. she agrees with it. she will call with any problems that may develop before the next visit here. Total time spent: 30 mins including face to face time and time spent for planning, charting and co-ordination of care   Harriette Ohara, MD 06/23/20

## 2020-06-29 DIAGNOSIS — E7800 Pure hypercholesterolemia, unspecified: Secondary | ICD-10-CM | POA: Diagnosis not present

## 2020-06-29 DIAGNOSIS — D638 Anemia in other chronic diseases classified elsewhere: Secondary | ICD-10-CM | POA: Diagnosis not present

## 2020-08-18 DIAGNOSIS — N390 Urinary tract infection, site not specified: Secondary | ICD-10-CM | POA: Diagnosis not present

## 2020-10-06 ENCOUNTER — Encounter: Payer: Self-pay | Admitting: Genetic Counselor

## 2020-10-24 ENCOUNTER — Ambulatory Visit
Admission: EM | Admit: 2020-10-24 | Discharge: 2020-10-24 | Disposition: A | Discharge status: Home / Self Care | Payer: Medicare Other | Attending: Family Medicine | Admitting: Family Medicine

## 2020-10-24 ENCOUNTER — Encounter: Payer: Self-pay | Admitting: Emergency Medicine

## 2020-10-24 DIAGNOSIS — S0101XA Laceration without foreign body of scalp, initial encounter: Secondary | ICD-10-CM

## 2020-10-24 DIAGNOSIS — S0990XA Unspecified injury of head, initial encounter: Secondary | ICD-10-CM

## 2020-10-24 NOTE — Discharge Instructions (Signed)
Seek prompt medical care if: You have: A very bad (severe) headache that is not helped by medicine. Trouble walking or weakness in your arms and legs. Clear or bloody fluid coming from your nose or ears. Changes in your seeing (vision). Jerky movements that you cannot control (seizure). You throw up (vomit). Your symptoms get worse. You lose balance. Your speech is slurred. You pass out. You are sleepier and have trouble staying awake. The black centers of your eyes (pupils) change in size.  These symptoms may be an emergency. Do not wait to see if the symptoms will go away. Get medical help right away. Call your local emergency services. Do not drive yourself to the hospital.

## 2020-10-24 NOTE — ED Triage Notes (Addendum)
Pt presents today with laceration to left side of head s/p fall. She reports tripping on a rug striking head on wall trim. Denies LOC. Bleeding controlled at this time. Last Tetanus unknown.

## 2020-10-26 NOTE — ED Provider Notes (Signed)
[place]   [id number] 10/24/20 Arrival Time: 1400  ASSESSMENT & PLAN:  1. Laceration of scalp, initial encounter   2. Minor head injury without loss of consciousness, initial encounter    No LOC. Normal neurological exam. Head injury precautions discussed and given in written format.  Procedure: Verbal consent obtained. Patient provided with risks and alternatives to the procedure. Wound copiously irrigated with NS then cleansed with betadine. Local anesthesia: Lidocaine 1% with epinephrine. Wound carefully explored. No foreign body or nonviable tissue were noted. Using sterile technique, 4 staples were placed to reapproximate the wound. Procedure tolerated well. No complications. Minimal bleeding. Advised to look for and return for any signs of infection such as redness, swelling, discharge, or worsening pain. Return for staple removal in 5-7 days.    Discharge Instructions      Seek prompt medical care if: You have: A very bad (severe) headache that is not helped by medicine. Trouble walking or weakness in your arms and legs. Clear or bloody fluid coming from your nose or ears. Changes in your seeing (vision). Jerky movements that you cannot control (seizure). You throw up (vomit). Your symptoms get worse. You lose balance. Your speech is slurred. You pass out. You are sleepier and have trouble staying awake. The black centers of your eyes (pupils) change in size.  These symptoms may be an emergency. Do not wait to see if the symptoms will go away. Get medical help right away. Call your local emergency services. Do not drive yourself to the hospital.    Reviewed expectations re: course of current medical issues. Questions answered. Outlined signs and symptoms indicating need for more acute intervention. Patient verbalized understanding. After Visit Summary given.   SUBJECTIVE:  Kim Krueger is a 80 y.o. female who presents with a laceration to  superior L scalp; today s/p tripping on rug and hitting wall trim; active bleeding that is now controlled. No LOC. Ambulatory here. No visual changes or HA. No extremity sensation changes or weakness.   Td UTD: unsure; prefers to ask PCP.   OBJECTIVE:  Vitals:   10/24/20 1529  BP: (!) 163/95  Pulse: 85  Resp: 18  Temp: 98.9 F (37.2 C)  TempSrc: Oral  SpO2: 99%     General appearance: alert; no distress Skin: linear laceration of superior L scalp; size: approx 3 cm; clean wound edges, no foreign bodies; without active bleeding Psychological: alert and cooperative; normal mood and affect    Allergies  Allergen Reactions   Ceftin [Cefuroxime Axetil] Nausea Only    Caused sever yeast infection   Hydrocodone    Sulfa Antibiotics Other (See Comments)    Unknown childhood reaction after surgery    Tramadol Other (See Comments)    Cause pain to worsen    Penicillins Rash    Has patient had a PCN reaction causing immediate rash, facial/tongue/throat swelling, SOB or lightheadedness with hypotension: Yes Has patient had a PCN reaction causing severe rash involving mucus membranes or skin necrosis: No Has patient had a PCN reaction that required hospitalization No Has patient had a PCN reaction occurring within the last 10 years: No If all of the above answers are "NO", then may proceed with Cephalosporin use.     Past Medical History:  Diagnosis Date   Cancer [facility])    uterine cancer, had hysterectomy   Family history of colon cancer    Family history of ovarian cancer    Hypothyroidism    Temporal arteritis (  [place])    Thyroid disease    Urge incontinence    Social History   Socioeconomic History   Marital status: Married    Spouse name: Not on file   Number of children: Not on file   Years of education: Not on file   Highest education level: Not on file  Occupational History   Not on file  Tobacco Use   Smoking status: Never   Smokeless tobacco: Never  Vaping  Use   Vaping Use: Never used  Substance and Sexual Activity   Alcohol use: No   Drug use: No   Sexual activity: Not on file  Other Topics Concern   Not on file  Social History Narrative   Not on file   Social Determinants of Health   Financial Resource Strain: Not on file  Food Insecurity: Not on file  Transportation Needs: Not on file  Physical Activity: Not on file  Stress: Not on file  Social Connections: Not on file          Vanessa Kick, MD 10/26/20 [phone]

## 2020-10-30 ENCOUNTER — Ambulatory Visit
Admission: EM | Admit: 2020-10-30 | Discharge: 2020-10-30 | Disposition: A | Discharge status: Home / Self Care | Payer: Medicare Other

## 2020-10-30 ENCOUNTER — Other Ambulatory Visit: Payer: Self-pay

## 2020-10-30 DIAGNOSIS — Z4802 Encounter for removal of sutures: Secondary | ICD-10-CM

## 2020-10-30 NOTE — ED Triage Notes (Signed)
Pt is present today for staple removal. Pt denies any pain

## 2020-10-30 NOTE — ED Notes (Signed)
Pt present today for staple removal. Four staples were removed. Patient tolerated well.

## 2020-11-21 ENCOUNTER — Ambulatory Visit: Payer: Self-pay

## 2020-11-23 DIAGNOSIS — N390 Urinary tract infection, site not specified: Secondary | ICD-10-CM | POA: Diagnosis not present

## 2020-12-17 DIAGNOSIS — M25511 Pain in right shoulder: Secondary | ICD-10-CM | POA: Diagnosis not present

## 2020-12-22 DIAGNOSIS — R300 Dysuria: Secondary | ICD-10-CM | POA: Diagnosis not present

## 2020-12-22 DIAGNOSIS — Z87440 Personal history of urinary (tract) infections: Secondary | ICD-10-CM | POA: Diagnosis not present

## 2020-12-22 DIAGNOSIS — N390 Urinary tract infection, site not specified: Secondary | ICD-10-CM | POA: Diagnosis not present

## 2021-01-04 DIAGNOSIS — N3000 Acute cystitis without hematuria: Secondary | ICD-10-CM | POA: Diagnosis not present

## 2021-01-04 DIAGNOSIS — N3941 Urge incontinence: Secondary | ICD-10-CM | POA: Diagnosis not present

## 2021-01-04 DIAGNOSIS — R350 Frequency of micturition: Secondary | ICD-10-CM | POA: Diagnosis not present

## 2021-01-06 ENCOUNTER — Other Ambulatory Visit: Payer: Self-pay | Admitting: Urology

## 2021-01-14 DIAGNOSIS — Z01419 Encounter for gynecological examination (general) (routine) without abnormal findings: Secondary | ICD-10-CM | POA: Diagnosis not present

## 2021-02-15 DIAGNOSIS — M859 Disorder of bone density and structure, unspecified: Secondary | ICD-10-CM | POA: Diagnosis not present

## 2021-02-15 DIAGNOSIS — E0500 Thyrotoxicosis with diffuse goiter without thyrotoxic crisis or storm: Secondary | ICD-10-CM | POA: Diagnosis not present

## 2021-02-15 DIAGNOSIS — E7800 Pure hypercholesterolemia, unspecified: Secondary | ICD-10-CM | POA: Diagnosis not present

## 2021-02-22 DIAGNOSIS — R82998 Other abnormal findings in urine: Secondary | ICD-10-CM | POA: Diagnosis not present

## 2021-03-11 DIAGNOSIS — M25511 Pain in right shoulder: Secondary | ICD-10-CM | POA: Diagnosis not present

## 2021-03-11 DIAGNOSIS — M7741 Metatarsalgia, right foot: Secondary | ICD-10-CM | POA: Diagnosis not present

## 2021-03-11 DIAGNOSIS — M7541 Impingement syndrome of right shoulder: Secondary | ICD-10-CM | POA: Diagnosis not present

## 2021-03-13 DIAGNOSIS — M7741 Metatarsalgia, right foot: Secondary | ICD-10-CM

## 2021-03-13 DIAGNOSIS — M25511 Pain in right shoulder: Secondary | ICD-10-CM

## 2021-03-13 DIAGNOSIS — M7541 Impingement syndrome of right shoulder: Secondary | ICD-10-CM | POA: Insufficient documentation

## 2021-03-13 HISTORY — DX: Impingement syndrome of right shoulder: M75.41

## 2021-03-13 HISTORY — DX: Metatarsalgia, right foot: M77.41

## 2021-03-13 HISTORY — DX: Pain in right shoulder: M25.511

## 2021-03-28 NOTE — Progress Notes (Signed)
History of Present Illness: Kim Krueger is a 81 y.o. year old female presents for 1st visit to [facility] for E/M of OAB sx's as well as recurrent cystitis.  Last seen a few months ago in [facility] [place].  She was given Solifenacin in place of Myrbetriq.  She does well with this.  She has a good stream and feels like she empties well.  Limited [place] and urgency and no incontinence.  No recent urinary tract infections.  Past Medical History:  Diagnosis Date   Cancer [facility])    uterine cancer, had hysterectomy   Family history of colon cancer    Family history of ovarian cancer    Hypothyroidism    Temporal arteritis (HCC)    Thyroid disease    Urge incontinence     Past Surgical History:  Procedure Laterality Date   ABDOMINAL HYSTERECTOMY     ovaries removed as well   APPENDECTOMY     appendix ruptured as child, still have part of appendix   ARTERY BIOPSY Right 10/19/2015   Procedure: RIGHT TEMPORAL ARTERY BIOPSY;  Surgeon: Jimmye Norman, MD;  Location: [facility];  Service: General;  Laterality: Right;   BREAST LUMPECTOMY WITH RADIOACTIVE SEED LOCALIZATION Left 02/28/2018   Procedure: LEFT BREAST LUMPECTOMY WITH RADIOACTIVE SEED LOCALIZATION;  Surgeon: Glenna Fellows, MD;  Location: [facility];  Service: General;  Laterality: Left;   HERNIA REPAIR  03/2016   umbilical   INSERTION OF MESH N/A 03/28/2016   Procedure: INSERTION OF MESH;  Surgeon: Jimmye Norman, MD;  Location: [facility];  Service: General;  Laterality: N/A;   UMBILICAL HERNIA REPAIR N/A 03/28/2016   Procedure: LAPAROSCOPIC UMBILICAL HERNIA;  Surgeon: Jimmye Norman, MD;  Location: [facility] OR;  Service: General;  Laterality: N/A;    Home Medications:  (Not in a hospital admission)   Allergies:  Allergies  Allergen Reactions   Ceftin [Cefuroxime Axetil] Nausea Only    Caused sever yeast infection   Hydrocodone    Sulfa Antibiotics Other (See Comments)    Unknown childhood  reaction after surgery    Tramadol Other (See Comments)    Cause pain to worsen    Penicillins Rash    Has patient had a PCN reaction causing immediate rash, facial/tongue/throat swelling, SOB or lightheadedness with hypotension: Yes Has patient had a PCN reaction causing severe rash involving mucus membranes or skin necrosis: No Has patient had a PCN reaction that required hospitalization No Has patient had a PCN reaction occurring within the last 10 years: No If all of the above answers are "NO", then may proceed with Cephalosporin use.     Family History  Problem Relation Age of Onset   Colon cancer Mother 82       d. 32   Heart disease Father    Ovarian cancer Cousin        died at 76; pat first cousin   Dementia Maternal Uncle    Dementia Maternal Grandmother    Dementia Cousin 60       mat first cousin    Social History:  reports that she has never smoked. She has never used smokeless tobacco. She reports that she does not drink alcohol and does not use drugs.  ROS: A complete review of systems was performed.  All systems are negative except for pertinent findings as noted.  Physical Exam:  Vital signs in last 24 hours: @VSRANGES @ General:  Alert and oriented, No acute distress HEENT: Normocephalic,  atraumatic Neck: No JVD or lymphadenopathy Cardiovascular: Regular rate  Lungs: Normal inspiratory/expiratory excursion Extremities: No edema Neurologic: Grossly intact  I have reviewed prior pt notes  I have reviewed notes from previous physicians  I have reviewed urinalysis results  I have reviewed prior urine culture   Impression/Assessment:  Overactive bladder symptoms, doing well switching from Myrbetriq to Solifenacin  Plan:  I will have her come back every other year for recheck, continue the Solifenacin at current dose  Bertram Millard Annabeth Tortora 03/28/2021, 5:52 PM  Bertram Millard. Sharmayne Jablon MD

## 2021-03-30 ENCOUNTER — Ambulatory Visit: Payer: Medicare Other | Admitting: Urology

## 2021-03-30 ENCOUNTER — Other Ambulatory Visit: Payer: Self-pay

## 2021-03-30 VITALS — BP 158/107 | HR 65 | Ht 63.0 in | Wt 178.0 lb

## 2021-03-30 DIAGNOSIS — N3000 Acute cystitis without hematuria: Secondary | ICD-10-CM

## 2021-03-30 DIAGNOSIS — N952 Postmenopausal atrophic vaginitis: Secondary | ICD-10-CM

## 2021-03-31 LAB — URINALYSIS, ROUTINE W REFLEX MICROSCOPIC
Bilirubin, UA: NEGATIVE
Glucose, UA: NEGATIVE
Nitrite, UA: POSITIVE — AB
Specific Gravity, UA: 1.025 (ref 1.005–1.030)
Urobilinogen, Ur: 0.2 mg/dL (ref 0.2–1.0)
pH, UA: 6 (ref 5.0–7.5)

## 2021-03-31 LAB — MICROSCOPIC EXAMINATION
Renal Epithel, UA: NONE SEEN /hpf
WBC, UA: >30 /hpf — AB (ref 0–5)

## 2021-05-20 DIAGNOSIS — Z1212 Encounter for screening for malignant neoplasm of rectum: Secondary | ICD-10-CM | POA: Diagnosis not present

## 2021-06-23 ENCOUNTER — Ambulatory Visit: Payer: Medicare Other | Admitting: Hematology and Oncology

## 2021-07-01 ENCOUNTER — Other Ambulatory Visit: Payer: Self-pay | Admitting: Hematology and Oncology

## 2021-07-05 DIAGNOSIS — R410 Disorientation, unspecified: Secondary | ICD-10-CM | POA: Diagnosis not present

## 2021-07-05 DIAGNOSIS — N390 Urinary tract infection, site not specified: Secondary | ICD-10-CM | POA: Diagnosis not present

## 2021-07-05 DIAGNOSIS — R413 Other amnesia: Secondary | ICD-10-CM | POA: Diagnosis not present

## 2021-07-05 DIAGNOSIS — N3941 Urge incontinence: Secondary | ICD-10-CM | POA: Diagnosis not present

## 2021-07-12 ENCOUNTER — Encounter: Payer: Self-pay | Admitting: Physician Assistant

## 2021-07-13 ENCOUNTER — Inpatient Hospital Stay (HOSPITAL_COMMUNITY)
Admission: EM | Admit: 2021-07-13 | Discharge: 2021-07-15 | DRG: 871 | Disposition: A | Discharge status: Home Health | Payer: Medicare Other | Attending: Internal Medicine | Admitting: Internal Medicine

## 2021-07-13 ENCOUNTER — Emergency Department (HOSPITAL_COMMUNITY): Payer: Medicare Other

## 2021-07-13 ENCOUNTER — Encounter (HOSPITAL_COMMUNITY): Payer: Self-pay | Admitting: Emergency Medicine

## 2021-07-13 ENCOUNTER — Other Ambulatory Visit: Payer: Self-pay

## 2021-07-13 DIAGNOSIS — Z79890 Hormone replacement therapy (postmenopausal): Secondary | ICD-10-CM

## 2021-07-13 DIAGNOSIS — R6510 Systemic inflammatory response syndrome (SIRS) of non-infectious origin without acute organ dysfunction: Secondary | ICD-10-CM | POA: Diagnosis present

## 2021-07-13 DIAGNOSIS — Z79899 Other long term (current) drug therapy: Secondary | ICD-10-CM

## 2021-07-13 DIAGNOSIS — N390 Urinary tract infection, site not specified: Secondary | ICD-10-CM

## 2021-07-13 DIAGNOSIS — Z853 Personal history of malignant neoplasm of breast: Secondary | ICD-10-CM | POA: Diagnosis not present

## 2021-07-13 DIAGNOSIS — Z90710 Acquired absence of both cervix and uterus: Secondary | ICD-10-CM

## 2021-07-13 DIAGNOSIS — N3000 Acute cystitis without hematuria: Secondary | ICD-10-CM | POA: Diagnosis not present

## 2021-07-13 DIAGNOSIS — Z79811 Long term (current) use of aromatase inhibitors: Secondary | ICD-10-CM

## 2021-07-13 DIAGNOSIS — Z800 Family history of malignant neoplasm of digestive organs: Secondary | ICD-10-CM | POA: Diagnosis not present

## 2021-07-13 DIAGNOSIS — E8809 Other disorders of plasma-protein metabolism, not elsewhere classified: Secondary | ICD-10-CM | POA: Diagnosis present

## 2021-07-13 DIAGNOSIS — R9431 Abnormal electrocardiogram [ECG] [EKG]: Secondary | ICD-10-CM

## 2021-07-13 DIAGNOSIS — J9811 Atelectasis: Secondary | ICD-10-CM | POA: Diagnosis present

## 2021-07-13 DIAGNOSIS — A419 Sepsis, unspecified organism: Principal | ICD-10-CM

## 2021-07-13 DIAGNOSIS — Z8249 Family history of ischemic heart disease and other diseases of the circulatory system: Secondary | ICD-10-CM | POA: Diagnosis not present

## 2021-07-13 DIAGNOSIS — K449 Diaphragmatic hernia without obstruction or gangrene: Secondary | ICD-10-CM | POA: Diagnosis not present

## 2021-07-13 DIAGNOSIS — R32 Unspecified urinary incontinence: Secondary | ICD-10-CM | POA: Diagnosis present

## 2021-07-13 DIAGNOSIS — Z8542 Personal history of malignant neoplasm of other parts of uterus: Secondary | ICD-10-CM

## 2021-07-13 DIAGNOSIS — Z1611 Resistance to penicillins: Secondary | ICD-10-CM | POA: Diagnosis not present

## 2021-07-13 DIAGNOSIS — R410 Disorientation, unspecified: Secondary | ICD-10-CM | POA: Diagnosis not present

## 2021-07-13 DIAGNOSIS — E876 Hypokalemia: Secondary | ICD-10-CM | POA: Diagnosis not present

## 2021-07-13 DIAGNOSIS — Z20822 Contact with and (suspected) exposure to covid-19: Secondary | ICD-10-CM | POA: Diagnosis not present

## 2021-07-13 DIAGNOSIS — R509 Fever, unspecified: Secondary | ICD-10-CM | POA: Diagnosis not present

## 2021-07-13 DIAGNOSIS — Z8041 Family history of malignant neoplasm of ovary: Secondary | ICD-10-CM

## 2021-07-13 DIAGNOSIS — E039 Hypothyroidism, unspecified: Secondary | ICD-10-CM

## 2021-07-13 DIAGNOSIS — N3281 Overactive bladder: Secondary | ICD-10-CM | POA: Diagnosis present

## 2021-07-13 DIAGNOSIS — G9341 Metabolic encephalopathy: Secondary | ICD-10-CM

## 2021-07-13 DIAGNOSIS — I10 Essential (primary) hypertension: Secondary | ICD-10-CM | POA: Diagnosis not present

## 2021-07-13 LAB — CBC WITH DIFFERENTIAL/PLATELET
Abs Immature Granulocytes: 0.15 10*3/uL — ABNORMAL HIGH (ref 0.00–0.07)
Basophils Absolute: 0 10*3/uL (ref 0.0–0.1)
Basophils Relative: 0 %
Eosinophils Absolute: 0 10*3/uL (ref 0.0–0.5)
Eosinophils Relative: 0 %
HCT: 34.2 % — ABNORMAL LOW (ref 36.0–46.0)
Hemoglobin: 11.1 g/dL — ABNORMAL LOW (ref 12.0–15.0)
Immature Granulocytes: 1 %
Lymphocytes Relative: 7 %
Lymphs Abs: 1.1 10*3/uL (ref 0.7–4.0)
MCH: 30.7 pg (ref 26.0–34.0)
MCHC: 32.5 g/dL (ref 30.0–36.0)
MCV: 94.7 fL (ref 80.0–100.0)
Monocytes Absolute: 1.1 10*3/uL — ABNORMAL HIGH (ref 0.1–1.0)
Monocytes Relative: 7 %
Neutro Abs: 14.4 10*3/uL — ABNORMAL HIGH (ref 1.7–7.7)
Neutrophils Relative %: 85 %
Platelets: 376 10*3/uL (ref 150–400)
RBC: 3.61 MIL/uL — ABNORMAL LOW (ref 3.87–5.11)
RDW: 13.2 % (ref 11.5–15.5)
WBC: 16.7 10*3/uL — ABNORMAL HIGH (ref 4.0–10.5)
nRBC: 0 % (ref 0.0–0.2)

## 2021-07-13 LAB — URINALYSIS, ROUTINE W REFLEX MICROSCOPIC
Bilirubin Urine: NEGATIVE
Glucose, UA: NEGATIVE mg/dL
Ketones, ur: 20 mg/dL — AB
Nitrite: NEGATIVE
Protein, ur: 30 mg/dL — AB
Specific Gravity, Urine: 1.012 (ref 1.005–1.030)
WBC, UA: >50 WBC/hpf — ABNORMAL HIGH (ref 0–5)
pH: 6 (ref 5.0–8.0)

## 2021-07-13 LAB — COMPREHENSIVE METABOLIC PANEL
ALT: 17 U/L (ref 0–44)
AST: 15 U/L (ref 15–41)
Albumin: 3.1 g/dL — ABNORMAL LOW (ref 3.5–5.0)
Alkaline Phosphatase: 64 U/L (ref 38–126)
Anion gap: 11 (ref 5–15)
BUN: 11 mg/dL (ref 8–23)
CO2: 25 mmol/L (ref 22–32)
Calcium: 8.3 mg/dL — ABNORMAL LOW (ref 8.9–10.3)
Chloride: 100 mmol/L (ref 98–111)
Creatinine, Ser: 0.95 mg/dL (ref 0.44–1.00)
GFR, Estimated: >60 mL/min (ref >60)
Glucose, Bld: 142 mg/dL — ABNORMAL HIGH (ref 70–99)
Potassium: 2.5 mmol/L — CL (ref 3.5–5.1)
Sodium: 136 mmol/L (ref 135–145)
Total Bilirubin: 1 mg/dL (ref 0.3–1.2)
Total Protein: 6.9 g/dL (ref 6.5–8.1)

## 2021-07-13 LAB — LACTIC ACID, PLASMA
Lactic Acid, Venous: 1.9 mmol/L (ref 0.5–1.9)
Lactic Acid, Venous: 1.4 mmol/L (ref 0.5–1.9)

## 2021-07-13 LAB — RESP PANEL BY RT-PCR (FLU A&B, COVID) ARPGX2
Influenza A by PCR: NEGATIVE
Influenza B by PCR: NEGATIVE
SARS Coronavirus 2 by RT PCR: NEGATIVE

## 2021-07-13 LAB — PROTIME-INR
INR: 1 (ref 0.8–1.2)
Prothrombin Time: 13.5 seconds (ref 11.4–15.2)

## 2021-07-13 LAB — APTT: aPTT: 28 seconds (ref 24–36)

## 2021-07-13 MED ORDER — POTASSIUM CHLORIDE 10 MEQ/100ML IV SOLN
10.0000 meq | Freq: Once | INTRAVENOUS | Status: AC
  Administered 2021-07-13: 10 meq via INTRAVENOUS
  Filled 2021-07-13: qty 100

## 2021-07-13 MED ORDER — LACTATED RINGERS IV SOLN: INTRAVENOUS | Status: AC

## 2021-07-13 MED ORDER — SODIUM CHLORIDE 0.9 % IV SOLN
1.0000 g | Freq: Once | INTRAVENOUS | Status: DC
  Administered 2021-07-13: 1 g via INTRAVENOUS
  Filled 2021-07-13: qty 20

## 2021-07-13 MED ORDER — MAGNESIUM SULFATE 2 GM/50ML IV SOLN
2.0000 g | Freq: Once | INTRAVENOUS | Status: AC
Start: 2021-07-13 — End: 2021-07-13
  Administered 2021-07-13: 2 g via INTRAVENOUS
  Filled 2021-07-13: qty 50

## 2021-07-13 MED ORDER — SODIUM CHLORIDE 0.9 % IV SOLN
2.0000 g | INTRAVENOUS | Status: DC
  Administered 2021-07-13 – 2021-07-14 (×2): 2 g via INTRAVENOUS
  Filled 2021-07-13 (×2): qty 20

## 2021-07-13 NOTE — Assessment & Plan Note (Deleted)
01/15/2018:Screening detected left breast calcifications 0.5 cm 9 o'clock position 7 cm from the nipple. 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  Treatment plan:Anastrozole 1 mg daily delayed due to family issues and stress.  Started 07/11/2019 She waited to get better from temporal arteritis Her husband had an accident. Had 5 surgeries. Lots of stress with home health. Dont know if they can save his leg.  Anastrozole toxicities: Denies any adverse effects to anastrozole therapy.  Breast cancer surveillance: 1.  Breast exam 07/13/2021: Benign 2. mammogram 02/06/2020: [facility]: Benign  Return to clinic in 1 year for follow-up

## 2021-07-13 NOTE — ED Triage Notes (Addendum)
Per husband pt has been confused and running fevers since last week. Pt was seen by pcp for the same and could not give urine sample. Pt was started on macrobid yesterday by pcp.

## 2021-07-14 ENCOUNTER — Inpatient Hospital Stay: Payer: Medicare Other | Attending: Hematology and Oncology | Admitting: Hematology and Oncology

## 2021-07-14 DIAGNOSIS — A419 Sepsis, unspecified organism: Secondary | ICD-10-CM

## 2021-07-14 DIAGNOSIS — G9341 Metabolic encephalopathy: Secondary | ICD-10-CM

## 2021-07-14 DIAGNOSIS — N390 Urinary tract infection, site not specified: Secondary | ICD-10-CM

## 2021-07-14 DIAGNOSIS — E039 Hypothyroidism, unspecified: Secondary | ICD-10-CM

## 2021-07-14 DIAGNOSIS — R6510 Systemic inflammatory response syndrome (SIRS) of non-infectious origin without acute organ dysfunction: Secondary | ICD-10-CM

## 2021-07-14 DIAGNOSIS — E876 Hypokalemia: Secondary | ICD-10-CM

## 2021-07-14 DIAGNOSIS — E8809 Other disorders of plasma-protein metabolism, not elsewhere classified: Secondary | ICD-10-CM

## 2021-07-14 DIAGNOSIS — C50212 Malignant neoplasm of upper-inner quadrant of left female breast: Secondary | ICD-10-CM

## 2021-07-14 DIAGNOSIS — Z853 Personal history of malignant neoplasm of breast: Secondary | ICD-10-CM

## 2021-07-14 DIAGNOSIS — R9431 Abnormal electrocardiogram [ECG] [EKG]: Secondary | ICD-10-CM

## 2021-07-14 HISTORY — DX: Sepsis, unspecified organism: A41.9

## 2021-07-14 HISTORY — DX: Metabolic encephalopathy: G93.41

## 2021-07-14 HISTORY — DX: Hypokalemia: E87.6

## 2021-07-14 HISTORY — DX: Abnormal electrocardiogram (ECG) (EKG): R94.31

## 2021-07-14 HISTORY — DX: Urinary tract infection, site not specified: N39.0

## 2021-07-14 LAB — COMPREHENSIVE METABOLIC PANEL
ALT: 14 U/L (ref 0–44)
AST: 13 U/L — ABNORMAL LOW (ref 15–41)
Albumin: 2.5 g/dL — ABNORMAL LOW (ref 3.5–5.0)
Alkaline Phosphatase: 57 U/L (ref 38–126)
Anion gap: 9 (ref 5–15)
BUN: 10 mg/dL (ref 8–23)
CO2: 23 mmol/L (ref 22–32)
Calcium: 7.9 mg/dL — ABNORMAL LOW (ref 8.9–10.3)
Chloride: 103 mmol/L (ref 98–111)
Creatinine, Ser: 0.73 mg/dL (ref 0.44–1.00)
GFR, Estimated: >60 mL/min (ref >60)
Glucose, Bld: 113 mg/dL — ABNORMAL HIGH (ref 70–99)
Potassium: 3 mmol/L — ABNORMAL LOW (ref 3.5–5.1)
Sodium: 135 mmol/L (ref 135–145)
Total Bilirubin: 0.7 mg/dL (ref 0.3–1.2)
Total Protein: 5.5 g/dL — ABNORMAL LOW (ref 6.5–8.1)

## 2021-07-14 LAB — MAGNESIUM: Magnesium: 2.2 mg/dL (ref 1.7–2.4)

## 2021-07-14 LAB — CBC WITH DIFFERENTIAL/PLATELET
Abs Immature Granulocytes: 0.1 10*3/uL — ABNORMAL HIGH (ref 0.00–0.07)
Basophils Absolute: 0 10*3/uL (ref 0.0–0.1)
Basophils Relative: 0 %
Eosinophils Absolute: 0 10*3/uL (ref 0.0–0.5)
Eosinophils Relative: 0 %
HCT: 30 % — ABNORMAL LOW (ref 36.0–46.0)
Hemoglobin: 9.8 g/dL — ABNORMAL LOW (ref 12.0–15.0)
Immature Granulocytes: 1 %
Lymphocytes Relative: 8 %
Lymphs Abs: 1.2 10*3/uL (ref 0.7–4.0)
MCH: 31.1 pg (ref 26.0–34.0)
MCHC: 32.7 g/dL (ref 30.0–36.0)
MCV: 95.2 fL (ref 80.0–100.0)
Monocytes Absolute: 1 10*3/uL (ref 0.1–1.0)
Monocytes Relative: 7 %
Neutro Abs: 12.1 10*3/uL — ABNORMAL HIGH (ref 1.7–7.7)
Neutrophils Relative %: 84 %
Platelets: 304 10*3/uL (ref 150–400)
RBC: 3.15 MIL/uL — ABNORMAL LOW (ref 3.87–5.11)
RDW: 13.4 % (ref 11.5–15.5)
WBC: 14.4 10*3/uL — ABNORMAL HIGH (ref 4.0–10.5)
nRBC: 0 % (ref 0.0–0.2)

## 2021-07-14 LAB — PROTIME-INR
INR: 1.2 (ref 0.8–1.2)
Prothrombin Time: 15.3 seconds — ABNORMAL HIGH (ref 11.4–15.2)

## 2021-07-14 LAB — PROCALCITONIN: Procalcitonin: 0.35 ng/mL

## 2021-07-14 LAB — CORTISOL-AM, BLOOD: Cortisol - AM: 31.9 ug/dL — ABNORMAL HIGH (ref 6.7–22.6)

## 2021-07-14 LAB — TSH: TSH: 0.23 u[IU]/mL — ABNORMAL LOW (ref 0.350–4.500)

## 2021-07-14 MED ORDER — LEVOTHYROXINE SODIUM 88 MCG PO TABS
88.0000 ug | ORAL_TABLET | Freq: Every day | ORAL | Status: DC
  Administered 2021-07-14 – 2021-07-15 (×2): 88 ug via ORAL
  Filled 2021-07-14 (×2): qty 1

## 2021-07-14 MED ORDER — ONDANSETRON HCL 4 MG/2ML IJ SOLN: 4.0000 mg | Freq: Four times a day (QID) | INTRAMUSCULAR | Status: DC | PRN

## 2021-07-14 MED ORDER — PHENAZOPYRIDINE HCL 100 MG PO TABS
200.0000 mg | ORAL_TABLET | Freq: Three times a day (TID) | ORAL | Status: DC
  Administered 2021-07-14 – 2021-07-15 (×5): 200 mg via ORAL
  Filled 2021-07-14 (×5): qty 2

## 2021-07-14 MED ORDER — ACETAMINOPHEN 650 MG RE SUPP: 650.0000 mg | Freq: Four times a day (QID) | RECTAL | Status: DC | PRN

## 2021-07-14 MED ORDER — HEPARIN SODIUM (PORCINE) 5000 UNIT/ML IJ SOLN
5000.0000 [IU] | Freq: Three times a day (TID) | INTRAMUSCULAR | Status: DC
Start: 2021-07-14 — End: 2021-07-15
  Administered 2021-07-14 – 2021-07-15 (×5): 5000 [IU] via SUBCUTANEOUS
  Filled 2021-07-14 (×5): qty 1

## 2021-07-14 MED ORDER — ACETAMINOPHEN 325 MG PO TABS: 650.0000 mg | ORAL_TABLET | Freq: Four times a day (QID) | ORAL | Status: DC | PRN

## 2021-07-14 MED ORDER — OXYCODONE HCL 5 MG PO TABS: 5.0000 mg | ORAL_TABLET | ORAL | Status: DC | PRN

## 2021-07-14 MED ORDER — SODIUM CHLORIDE 0.9 % IV SOLN: INTRAVENOUS | Status: DC | PRN

## 2021-07-14 MED ORDER — POTASSIUM CHLORIDE CRYS ER 20 MEQ PO TBCR
40.0000 meq | EXTENDED_RELEASE_TABLET | Freq: Once | ORAL | Status: AC
  Administered 2021-07-14: 40 meq via ORAL
  Filled 2021-07-14: qty 2

## 2021-07-14 MED ORDER — ONDANSETRON HCL 4 MG PO TABS: 4.0000 mg | ORAL_TABLET | Freq: Four times a day (QID) | ORAL | Status: DC | PRN

## 2021-07-14 MED ORDER — POTASSIUM CHLORIDE 20 MEQ PO PACK
40.0000 meq | PACK | Freq: Once | ORAL | Status: AC
  Administered 2021-07-14: 40 meq via ORAL
  Filled 2021-07-14: qty 2

## 2021-07-14 NOTE — Plan of Care (Signed)
  Problem: Acute Rehab PT Goals(only PT should resolve) Goal: Pt Will Go Supine/Side To Sit Outcome: Progressing Flowsheets (Taken 07/14/2021 1354) Pt will go Supine/Side to Sit: with modified independence Goal: Patient Will Transfer Sit To/From Stand Outcome: Progressing Flowsheets (Taken 07/14/2021 1354) Patient will transfer sit to/from stand:  with modified independence  with supervision Goal: Pt Will Transfer Bed To Chair/Chair To Bed Outcome: Progressing Flowsheets (Taken 07/14/2021 1354) Pt will Transfer Bed to Chair/Chair to Bed:  with modified independence  with supervision Goal: Pt Will Perform Standing Balance Or Pre-Gait Outcome: Progressing Flowsheets (Taken 07/14/2021 1354) Pt will perform standing balance or pre-gait:  3- 5 min  with bilateral UE support  with min guard assist  with Supervision Goal: Pt Will Ambulate Outcome: Progressing Flowsheets (Taken 07/14/2021 1354) Pt will Ambulate:  100 feet  with min guard assist  with rolling walker   1:55 PM, 07/14/21 Lestine Box, S/PT

## 2021-07-14 NOTE — Assessment & Plan Note (Signed)
Outpatient follow-up with Dr. Lindi Adie Continue anastrozole

## 2021-07-14 NOTE — Assessment & Plan Note (Signed)
-   Secondary to hypokalemia -Monitor on telemetry -Hold QT prolonging agents when possible

## 2021-07-14 NOTE — Assessment & Plan Note (Addendum)
-   Potassium 2.5 -20 mEq potassium given in the ED followed by 40 mEq potassium given at admission -2 g of magnesium also given in the ED -07/14/2021 magnesium 2.2

## 2021-07-14 NOTE — H&P (Signed)
History and Physical    Patient: Kim Krueger PIR:[id number] DOB: [birth year] DOA: 07/13/2021 DOS: the patient was seen and examined on 07/14/2021 PCP: Tisovec, Fransico Him, MD  Patient coming from: Home  Chief Complaint:  Chief Complaint  Patient presents with   Altered Mental Status   HPI: Kim Krueger is a 81 y.o. female with medical history significant of cancer with family history of cancer, history of hypothyroidism, and recurrent UTIs presents to the ED with a chief complaint of UTI issues.  When husband was with patient her chief complaint was actually altered mental status.  She seems perfectly coherent until you really drill her on details of her history, and then it is obvious that she is confused.  When you ask her if she was confused she reports that she still has.  So, she is aware of it.  Is unclear how accurate any of this history is, but she reports that she has had recurrent UTIs over the last year.  This episode has been going on for last 6 months, but then a different time when as she said 3 months, and then a different time when as she said 1 month.  Her favorite antibiotic for this issue is Cipro, and she is mad because her PCP will not give it to her.  She reports that she has been getting other antibiotics that do not do the job.  She thinks the last antibiotic she had was doxycycline.  She thinks that that antibiotic was about a month ago, but then at a different time she says that was last week.  She reports that she did not finish the course because it was not helping.  Her symptoms are dysuria, incontinence, frequency, urgency.  She has no hematuria, no fevers, no nausea or vomiting.  Patient reports no pain at this time.  She has never tried Pyridium but she is interested in trying it because she does have burning with urination.  Patient has no other complaints at this time.  Patient does not smoke, does not drink, does not use illicit drugs.  She is vaccinated for  COVID.  Patient is full code. Review of Systems: As mentioned in the history of present illness. All other systems reviewed and are negative. Past Medical History:  Diagnosis Date   Cancer [facility])    uterine cancer, had hysterectomy   Family history of colon cancer    Family history of ovarian cancer    Hypothyroidism    Temporal arteritis ([place])    Thyroid disease    Urge incontinence    Past Surgical History:  Procedure Laterality Date   ABDOMINAL HYSTERECTOMY     ovaries removed as well   APPENDECTOMY     appendix ruptured as child, still have part of appendix   ARTERY BIOPSY Right 10/19/2015   Procedure: RIGHT TEMPORAL ARTERY BIOPSY;  Surgeon: Judeth Horn, MD;  Location: [place];  Service: General;  Laterality: Right;   BREAST LUMPECTOMY WITH RADIOACTIVE SEED LOCALIZATION Left 02/28/2018   Procedure: LEFT BREAST LUMPECTOMY WITH RADIOACTIVE SEED LOCALIZATION;  Surgeon: Excell Seltzer, MD;  Location: [place];  Service: General;  Laterality: Left;   HERNIA REPAIR  63/0160   umbilical   INSERTION OF MESH N/A 03/28/2016   Procedure: INSERTION OF MESH;  Surgeon: Judeth Horn, MD;  Location: [place];  Service: General;  Laterality: N/A;   UMBILICAL HERNIA REPAIR N/A 03/28/2016   Procedure: LAPAROSCOPIC UMBILICAL HERNIA;  Surgeon: Judeth Horn,  MD;  Location: [place];  Service: General;  Laterality: N/A;   Social History:  reports that she has never smoked. She has never used smokeless tobacco. She reports that she does not drink alcohol and does not use drugs.  Allergies  Allergen Reactions   Ceftin [Cefuroxime Axetil] Nausea Only    Caused sever yeast infection   Hydrocodone    Sulfa Antibiotics Other (See Comments)    Unknown childhood reaction after surgery    Tramadol Other (See Comments)    Cause pain to worsen    Penicillins Rash    Has patient had a PCN reaction causing immediate rash, facial/tongue/throat swelling, SOB or lightheadedness with  hypotension: Yes Has patient had a PCN reaction causing severe rash involving mucus membranes or skin necrosis: No Has patient had a PCN reaction that required hospitalization No Has patient had a PCN reaction occurring within the last 10 years: No If all of the above answers are "NO", then may proceed with Cephalosporin use.     Family History  Problem Relation Age of Onset   Colon cancer Mother 20       d. 51   Heart disease Father    Ovarian cancer Cousin        died at 44; pat first cousin   Dementia Maternal Uncle    Dementia Maternal Grandmother    Dementia Cousin 23       mat first cousin    Prior to Admission medications   Medication Sig Start Date End Date Taking? Authorizing Provider  acetaminophen (TYLENOL) 325 MG tablet Take 650 mg by mouth every 6 (six) hours as needed for moderate pain.   Yes [provider]  anastrozole (ARIMIDEX) 1 MG tablet TAKE 1 TABLET(1 MG) BY MOUTH DAILY Patient taking differently: Take 1 mg by mouth daily. 07/01/21  Yes Nicholas Lose, MD  levothyroxine (SYNTHROID) 88 MCG tablet Take 1 tablet by mouth daily.   Yes [provider]  nitrofurantoin, macrocrystal-monohydrate, (MACROBID) 100 MG capsule Take 1 capsule by mouth 2 (two) times daily. 07/08/21  Yes [provider]    Physical Exam: Vitals:   07/14/21 0000 07/14/21 0010 07/14/21 0027 07/14/21 0031  BP:  (!) 145/59  (!) 152/67  Pulse:  75  79  Resp: 16   17  Temp:  98.4 F (36.9 C)  98.5 F (36.9 C)  TempSrc:  Oral  Oral  SpO2: 95% 95%  96%  Weight:   71.2 kg   Height:       1.  General: Patient lying supine in bed,  no acute distress   2. Psychiatric: Alert and oriented x 3, mood and behavior normal for situation, pleasant and cooperative with exam   3. Neurologic: Speech and language are normal, face is symmetric, moves all 4 extremities voluntarily, at baseline without acute deficits on limited exam   4. HEENMT:  Head is atraumatic,  normocephalic, pupils reactive to light, neck is supple, trachea is midline, mucous membranes are moist   5. Respiratory : Lungs are clear to auscultation bilaterally without wheezing, rhonchi, rales, no cyanosis, no increase in work of breathing or accessory muscle use   6. Cardiovascular : Heart rate normal, rhythm is regular, no murmurs, rubs or gallops, no peripheral edema, peripheral pulses palpated   7. Gastrointestinal:  Abdomen is soft, nondistended, nontender to palpation bowel sounds active, no masses or organomegaly palpated   8. Skin:  Skin is warm, dry and intact without rashes, acute lesions,  or ulcers on limited exam   9.Musculoskeletal:  No acute deformities or trauma, no asymmetry in tone, no peripheral edema, peripheral pulses palpated, no tenderness to palpation in the extremities  Data Reviewed: In the ED Temp 101.5-102.8, heart rate 73-83, respiratory rate 17-22, blood pressure 129/52-172/63, satting at 96% Leukocytosis 16.7, hemoglobin 11.1, platelets 376 Chemistry reveals a hypokalemia 2.5-has been reports poor p.o. intake Albumin low at 3.1-husband reports poor p.o. intake Lactic acid 1.9 COVID is negative, flu is negative UA is indicative of UTI Urine culture pending Blood culture pending Patient started on Rocephin Patient started on fluids 150 mL/h LR Chest x-ray shows low lung volumes with bibasilar atelectasis.  Large retrocardiac hiatal hernia that is obscuring assessment of the mediastinal contours Patient was given mag and potassium Admission was requested for urosepsis  Assessment and Plan: * SIRS (systemic inflammatory response syndrome) (HCC) Sepsis -Temp 101.5-102.8, respiratory rate 22, leukocytosis 16.7, with encephalopathy -Secondary to UTI -Sepsis order set utilized -Procalcitonin in the a.m. -UA indicative of UTI -Blood cultures and urine culture pending -Last micro from 2018 shows Klebsiella oxytoca resistant to ampicillin but  sensitive to ceftriaxone and all else -Rocephin started in ED, continue Rocephin -LR started at 150 MLS per hour -BP stable -Chest x-ray shows low lung volumes but no evidence of infiltrate -No other infectious symptoms -  Hypothyroidism - Continue Synthroid -Check TSH in the setting of altered mental status  Prolonged QT interval - Secondary to hypokalemia -Monitor on telemetry -Hold QT prolonging agents when possible  Hypoalbuminemia - Albumin 3.1 -Secondary to poor p.o. intake reported by husband -Continue to encourage nutrient dense food intake -Continue to monitor  Hypokalemia - Potassium 2.5 -20 mEq potassium given in the ED followed by 40 mEq potassium given at admission -2 g of magnesium also given in the ED -Recheck mag and potassium in the a.m.  Acute metabolic encephalopathy - Secondary to UTI -Patient is only subtly confused, and she is aware of it -Electrolyte derangement could also be contributing with the potassium of 2.5 -Anticipate improvement with treatment of UTI and correction of potassium -No focal deficits on exam -Continue to monitor      Advance Care Planning:   Code Status: Full Code   Consults: None  Family Communication: No family at bedside  Severity of Illness: The appropriate patient status for this patient is INPATIENT. Inpatient status is judged to be reasonable and necessary in order to provide the required intensity of service to ensure the patient's safety. The patient's presenting symptoms, physical exam findings, and initial radiographic and laboratory data in the context of their chronic comorbidities is felt to place them at high risk for further clinical deterioration. Furthermore, it is not anticipated that the patient will be medically stable for discharge from the hospital within 2 midnights of admission.   * I certify that at the point of admission it is my clinical judgment that the patient will require inpatient hospital  care spanning beyond 2 midnights from the point of admission due to high intensity of service, high risk for further deterioration and high frequency of surveillance required.*  Author: Rolla Plate, DO 07/14/2021 5:00 AM  For on call review [email or web address].

## 2021-07-14 NOTE — TOC Progression Note (Signed)
Transition of Care [facility]) - Progression Note    Patient Details  Name: Kim Krueger MRN: [id number] Date of Birth: 07/03/1940  [facility] CM/SW Contact  Salome Arnt, [place] Phone Number: 07/14/2021, 11:14 AM  Clinical Narrative:  PT recommending home health. Discussed with pt who is agreeable with no preference on agency. Referred and accepted by [facility] with [facility]. TOC will continue to follow.        Barriers to Discharge: Continued Medical Work up  Expected Discharge Plan and Services                                     HH Arranged: PT [facility] Agency: [place] ([facility]) Date [place]: 07/14/21 Time [place]: 1114 Representative spoke with at [place]: [place] Determinants of Health ([place]) Interventions    Readmission Risk Interventions     No data to display

## 2021-07-14 NOTE — Assessment & Plan Note (Addendum)
-   Secondary to UTI -Electrolyte derangement could also be contributing with the potassium of 2.5 -spouse states pt is improving and near baseline at time of dc

## 2021-07-14 NOTE — Assessment & Plan Note (Addendum)
-   Continue Synthroid -TSH 0.230 -Repeat TSH in about 3 to 4 weeks after the patient is recovered from her illness

## 2021-07-14 NOTE — Assessment & Plan Note (Addendum)
Sepsis -Temp 101.5-102.8, respiratory rate 22, leukocytosis 16.7, with encephalopathy -Secondary to UTI -Sepsis order set utilized -Procalcitonin in the a.m. -UA indicative of UTI -Blood cultures and urine culture pending -Last micro from 2018 shows Klebsiella oxytoca resistant to ampicillin but sensitive to ceftriaxone and all else -Rocephin started in ED, continue Rocephin -LR started at 150 MLS per hour -BP stable -Chest x-ray shows low lung volumes but no evidence of infiltrate -No other infectious symptoms -

## 2021-07-14 NOTE — Assessment & Plan Note (Addendum)
Patient presented with leukocytosis, tachypnea, and fever Secondary to UTI Continue IV fluids Continue empiric ceftriaxone pending culture data Chest x-ray negative for consolidation UA > 50 WBC Sepsis physiology resolved

## 2021-07-14 NOTE — Progress Notes (Signed)
Attempted to get report from ED. Waiting for a call back.

## 2021-07-14 NOTE — Assessment & Plan Note (Signed)
-   Albumin 3.1 -Secondary to poor p.o. intake reported by husband -Continue to encourage nutrient dense food intake -Continue to monitor

## 2021-07-14 NOTE — Evaluation (Signed)
Physical Therapy Evaluation Patient Details Name: AASHKA SALOMONE MRN: [id number] DOB: [birth year] Today's Date: 07/14/2021  History of Present Illness  BRYTTANY TORTORELLI is a 81 y.o. female with medical history significant of cancer with family history of cancer, history of hypothyroidism, and recurrent UTIs presents to the ED with a chief complaint of UTI issues.  When husband was with patient her chief complaint was actually altered mental status.  She seems perfectly coherent until you really drill her on details of her history, and then it is obvious that she is confused.  When you ask her if she was confused she reports that she still has.  So, she is aware of it.  Is unclear how accurate any of this history is, but she reports that she has had recurrent UTIs over the last year.  This episode has been going on for last 6 months, but then a different time when as she said 3 months, and then a different time when as she said 1 month.  Her favorite antibiotic for this issue is Cipro, and she is mad because her PCP will not give it to her.  She reports that she has been getting other antibiotics that do not do the job.  She thinks the last antibiotic she had was doxycycline.  She thinks that that antibiotic was about a month ago, but then at a different time she says that was last week.  She reports that she did not finish the course because it was not helping.  Her symptoms are dysuria, incontinence, frequency, urgency.  She has no hematuria, no fevers, no nausea or vomiting.  Patient reports no pain at this time.  She has never tried Pyridium but she is interested in trying it because she does have burning with urination.  Patient has no other complaints at this time.   Clinical Impression  Patient presents sitting in chair with spouse in the room. Patient consents to PT evaluation. Patient is modified independent with bed mobility requiring extended time to complete task. Good trunk control and  appropriate strength observed. Patient is supervision assist with sit to stand with no AD. Min guard with bed to chair transfer and use of HHA due to reaching out for objects for stability. Forward trunk lean impacts base of support resulting in balance deficits during transfers. Patient able to ambulate 70 feet in hallway with min guard and use of RW. Attempted without AD but very unsteady. Verbal cueing needed for RW placement to prevent falls. Difficulty with turning and lacking spatial awareness impacting overall gait. Fair coordination with balance deficits observed. Patient tolerated sitting up in chair after therapy with nursing notified of mobility status. Patient will benefit from continued skilled physical therapy in hospital and recommended venue below to increase strength, balance, endurance for safe ADLs and gait.      Recommendations for follow up therapy are one component of a multi-disciplinary discharge planning process, led by the attending physician.  Recommendations may be updated based on patient status, additional functional criteria and insurance authorization.  Follow Up Recommendations Home health PT      Assistance Recommended at Discharge Set up Supervision/Assistance  Patient can return home with the following  A little help with walking and/or transfers;Help with stairs or ramp for entrance;Assistance with cooking/housework    Equipment Recommendations None recommended by PT  Recommendations for Other Services       Functional Status Assessment Patient has had a recent decline in their functional status and  demonstrates the ability to make significant improvements in function in a reasonable and predictable amount of time.     Precautions / Restrictions Precautions Precautions: Fall Restrictions Weight Bearing Restrictions: No      Mobility  Bed Mobility Overal bed mobility: Modified Independent             General bed mobility comments: Modified  independent with extended time needed. Good trunk control and appropriate strength    Transfers Overall transfer level: Needs assistance Equipment used: None Transfers: Sit to/from Stand, Bed to chair/wheelchair/BSC Sit to Stand: Supervision, Min guard   Step pivot transfers: Supervision, Min guard       General transfer comment: Supervision assist with STS and no AD. Min guard with HHA provided due to patient reaching out for objects for stabilization. Is unsteady with forward trunk lean impacting balance.    Ambulation/Gait Ambulation/Gait assistance: Min guard Gait Distance (Feet): 70 Feet Assistive device: Rolling walker (2 wheels) Gait Pattern/deviations: Step-to pattern, Decreased step length - left, Decreased step length - right, Decreased stride length, Trunk flexed, Narrow base of support Gait velocity: decreased     General Gait Details: Able to ambulate 70 feet with min guard and RW. Attempted without AD but unbalanced and unsteady. Verbal cueing needed for walker placement. Difficulty with turning and lacks spatial awareness. Fair coordination with balance deficits observed.  Stairs            Wheelchair Mobility    Modified Rankin (Stroke Patients Only)       Balance Overall balance assessment: Needs assistance Sitting-balance support: Bilateral upper extremity supported, Feet supported Sitting balance-Leahy Scale: Good Sitting balance - Comments: Able to sit EOB with UE support   Standing balance support: Bilateral upper extremity supported, During functional activity, Reliant on assistive device for balance Standing balance-Leahy Scale: Fair Standing balance comment: Fair with RW. Poor to fair without AD                             Pertinent Vitals/Pain Pain Assessment Pain Assessment: No/denies pain    Home Living Family/patient expects to be discharged to:: Private residence Living Arrangements: Spouse/significant other Available  Help at Discharge: Family;Available PRN/intermittently;Available 24 hours/day Type of Home: House Home Access: Stairs to enter Entrance Stairs-Rails: Right Entrance Stairs-Number of Steps: 5   Home Layout: One level Home Equipment: Conservation officer, nature (2 wheels);Cane - single point;Grab bars - tub/shower      Prior Function Prior Level of Function : Independent/Modified Independent             Mobility Comments: Patient reports ambulating at home with no RW previously. Limited community ambulator and is currently driving ADLs Comments: Patient reports being independent with ADL's with support as needed.     Hand Dominance        Extremity/Trunk Assessment   Upper Extremity Assessment Upper Extremity Assessment: Overall WFL for tasks assessed    Lower Extremity Assessment Lower Extremity Assessment: Overall WFL for tasks assessed    Cervical / Trunk Assessment Cervical / Trunk Assessment: Normal  Communication   Communication: No difficulties  Cognition Arousal/Alertness: Awake/alert Behavior During Therapy: WFL for tasks assessed/performed Overall Cognitive Status: Within Functional Limits for tasks assessed  General Comments      Exercises     Assessment/Plan    PT Assessment Patient needs continued PT services  PT Problem List Decreased strength;Decreased activity tolerance;Decreased balance;Decreased mobility;Decreased coordination       PT Treatment Interventions DME instruction;Gait training;Functional mobility training;Therapeutic activities;Therapeutic exercise;Balance training    PT Goals (Current goals can be found in the Care Plan section)  Acute Rehab PT Goals Patient Stated Goal: return home PT Goal Formulation: With patient/family Time For Goal Achievement: 07/21/21 Potential to Achieve Goals: Good    Frequency Min 3X/week     Co-evaluation               AM-PAC PT "6  Clicks" Mobility  Outcome Measure Help needed turning from your back to your side while in a flat bed without using bedrails?: None Help needed moving from lying on your back to sitting on the side of a flat bed without using bedrails?: None Help needed moving to and from a bed to a chair (including a wheelchair)?: A Little Help needed standing up from a chair using your arms (e.g., wheelchair or bedside chair)?: A Little Help needed to walk in hospital room?: A Little Help needed climbing 3-5 steps with a railing? : A Lot 6 Click Score: 19    End of Session   Activity Tolerance: Patient tolerated treatment well;No increased pain;Patient limited by fatigue Patient left: in chair;with call bell/phone within reach;with family/visitor present Nurse Communication: Mobility status PT Visit Diagnosis: Unsteadiness on feet (R26.81);Other abnormalities of gait and mobility (R26.89);Muscle weakness (generalized) (M62.81)    Time: 1040-1108 PT Time Calculation (min) (ACUTE ONLY): 28 min   Charges:   PT Evaluation $PT Eval Moderate Complexity: 1 Mod PT Treatments $Therapeutic Activity: 23-37 mins        1:54 PM, 07/14/21 Lestine Box, S/PT

## 2021-07-14 NOTE — Progress Notes (Signed)
PROGRESS NOTE  Kim Krueger EXH:[id number] DOB: 1940/05/04 DOA: 07/13/2021 PCP: Haywood Pao, MD  Brief History:  81 year old female with a history of left-sided breast cancer, temporal arteritis, hypothyroidism, overactive bladder, UTIs presenting with 3 to 4-day history of generalized weakness, poor oral intake, and fever.  The patient's spouse stated that her fever was up to 102.0 F on the day of admission.  The patient has been recently placed on Macrobid by her PCP.  She continued to have dysuria.  As result, she presented for further evaluation.  At the time of admission, the patient was somewhat confused.  According to the patient's spouse, the patient has had progressive memory loss for the last 6 months.  There was some concern that the patient's solifenacin may have been contributing, and this was stopped about 1 week prior to admission.  Spouse stated that her mental status had a little bit of improvement until the development of recurrent UTI.  The patient himself denies any headache, chest pain, shortness of breath, cough, hemoptysis, nausea, vomiting, diarrhea, abdominal pain.  She complains of dysuria. In the ED, the patient was febrile up to 102.8 F.  WBC 16.7, hemoglobin 11.1, platelets 376,000.  UA showed >50 WBC.  Sodium 136, potassium 2.5, bicarbonate 25, serum creatinine 0.95.  The patient was started on ceftriaxone.  Chest x-ray showed bibasilar atelectasis.    Assessment and Plan: * Sepsis due to undetermined organism [facility]) Patient presented with leukocytosis, tachypnea, and fever Secondary to UTI Continue IV fluids Continue empiric ceftriaxone pending culture data Chest x-ray negative for consolidation UA > 50 WBC  UTI (urinary tract infection) UA >50 WBC Continue ceftriaxone pending culture data  Acute metabolic encephalopathy - Secondary to UTI -Electrolyte derangement could also be contributing with the potassium of 2.5 -Anticipate  improvement with treatment of UTI and correction of potassium  History of left breast cancer Outpatient follow-up with Dr. Lindi Adie Continue anastrozole  Hypothyroidism - Continue Synthroid -TSH 0.230 -Repeat TSH in about 3 to 4 weeks after the patient is recovered from her illness  Prolonged QT interval - Secondary to hypokalemia -Monitor on telemetry -Hold QT prolonging agents when possible  Hypokalemia - Potassium 2.5 -20 mEq potassium given in the ED followed by 40 mEq potassium given at admission -2 g of magnesium also given in the ED -07/14/2021 magnesium 2.2      Family Communication:   spouse updated at bedside  Consultants:  none  Code Status:  FULL   DVT Prophylaxis:  [place] Heparin    Procedures: As Listed in Progress Note Above  Antibiotics: Ceftriaxone 6/27>>      Subjective: Patient denies fevers, chills, headache, chest pain, dyspnea, nausea, vomiting, diarrhea, abdominal pain, dysuria, hematuria, hematochezia, and melena.   Objective: Vitals:   07/14/21 0000 07/14/21 0010 07/14/21 0027 07/14/21 0031  BP:  (!) 145/59  (!) 152/67  Pulse:  75  79  Resp: 16   17  Temp:  98.4 F (36.9 C)  98.5 F (36.9 C)  TempSrc:  Oral  Oral  SpO2: 95% 95%  96%  Weight:   71.2 kg   Height:        Intake/Output Summary (Last 24 hours) at 07/14/2021 0931 Last data filed at 07/14/2021 0456 Gross per 24 hour  Intake 1856.48 ml  Output --  Net 1856.48 ml   Weight change:  Exam:  General:  Pt is alert, follows commands appropriately, not in acute  distress HEENT: No icterus, No thrush, No neck mass, [place]/AT Cardiovascular: RRR, S1/S2, no rubs, no gallops Respiratory: CTA bilaterally, no wheezing, no crackles, no rhonchi Abdomen: Soft/+BS, non tender, non distended, no guarding Extremities: No edema, No lymphangitis, No petechiae, No rashes, no synovitis   Data Reviewed: I have personally reviewed following labs and imaging studies Basic Metabolic  Panel: Recent Labs  Lab 07/13/21 1926 07/14/21 0438  NA 136 135  K 2.5* 3.0*  CL 100 103  CO2 25 23  GLUCOSE 142* 113*  BUN 11 10  CREATININE 0.95 0.73  CALCIUM 8.3* 7.9*  MG  --  2.2   Liver Function Tests: Recent Labs  Lab 07/13/21 1926 07/14/21 0438  AST 15 13*  ALT 17 14  ALKPHOS 64 57  BILITOT 1.0 0.7  PROT 6.9 5.5*  ALBUMIN 3.1* 2.5*   No results for input(s): "LIPASE", "AMYLASE" in the last 168 hours. No results for input(s): "AMMONIA" in the last 168 hours. Coagulation Profile: Recent Labs  Lab 07/13/21 1926 07/14/21 0438  INR 1.0 1.2   CBC: Recent Labs  Lab 07/13/21 1926 07/14/21 0438  WBC 16.7* 14.4*  NEUTROABS 14.4* 12.1*  HGB 11.1* 9.8*  HCT 34.2* 30.0*  MCV 94.7 95.2  PLT 376 304   Cardiac Enzymes: No results for input(s): "CKTOTAL", "CKMB", "CKMBINDEX", "TROPONINI" in the last 168 hours. BNP: Invalid input(s): "POCBNP" CBG: No results for input(s): "GLUCAP" in the last 168 hours. HbA1C: No results for input(s): "HGBA1C" in the last 72 hours. Urine analysis:    Component Value Date/Time   COLORURINE YELLOW 07/13/2021 2020   APPEARANCEUR CLEAR 07/13/2021 2020   APPEARANCEUR Cloudy (A) 03/30/2021 1438   LABSPEC 1.012 07/13/2021 2020   PHURINE 6.0 07/13/2021 2020   GLUCOSEU NEGATIVE 07/13/2021 2020   HGBUR MODERATE (A) 07/13/2021 2020   BILIRUBINUR NEGATIVE 07/13/2021 2020   BILIRUBINUR Negative 03/30/2021 1438   KETONESUR 20 (A) 07/13/2021 2020   PROTEINUR 30 (A) 07/13/2021 2020   NITRITE NEGATIVE 07/13/2021 2020   LEUKOCYTESUR SMALL (A) 07/13/2021 2020   Sepsis Labs: '@LABRCNTIP'$ (procalcitonin:4,lacticidven:4) ) Recent Results (from the past 240 hour(s))  Culture, blood (Routine x 2)     Status: None (Preliminary result)   Collection Time: 07/13/21  7:26 PM   Specimen: BLOOD RIGHT HAND  Result Value Ref Range Status   Specimen Description BLOOD RIGHT HAND  Final   Special Requests   Final    BOTTLES DRAWN AEROBIC ONLY Blood  Culture results may not be optimal due to an inadequate volume of blood received in culture bottles   Culture   Final    NO GROWTH < 12 HOURS Performed at [facility], [address] [place], [place] [address]    Report Status PENDING  Incomplete  Culture, blood (Routine x 2)     Status: None (Preliminary result)   Collection Time: 07/13/21  7:31 PM   Specimen: Left Antecubital; Blood  Result Value Ref Range Status   Specimen Description LEFT ANTECUBITAL  Final   Special Requests   Final    BOTTLES DRAWN AEROBIC AND ANAEROBIC Blood Culture results may not be optimal due to an excessive volume of blood received in culture bottles   Culture   Final    NO GROWTH < 12 HOURS Performed at [facility], [address], [place], [place] [address]    Report Status PENDING  Incomplete  Resp Panel by RT-PCR (Flu A&B, Covid) Anterior Nasal Swab     Status: None  Collection Time: 07/13/21  7:53 PM   Specimen: Anterior Nasal Swab  Result Value Ref Range Status   SARS Coronavirus 2 by RT PCR NEGATIVE NEGATIVE Final    Comment: (NOTE) SARS-CoV-2 target nucleic acids are NOT DETECTED.  The SARS-CoV-2 RNA is generally detectable in upper respiratory specimens during the acute phase of infection. The lowest concentration of SARS-CoV-2 viral copies this assay can detect is 138 copies/mL. A negative result does not preclude SARS-Cov-2 infection and should not be used as the sole basis for treatment or other patient management decisions. A negative result may occur with  improper specimen collection/handling, submission of specimen other than nasopharyngeal swab, presence of viral mutation(s) within the areas targeted by this assay, and inadequate number of viral copies(<138 copies/mL). A negative result must be combined with clinical observations, patient history, and epidemiological information. The expected result is Negative.  Fact Sheet for Patients:   EntrepreneurPulse.com.au  Fact Sheet for Healthcare Providers:  IncredibleEmployment.be  This test is no t yet approved or cleared by the [place] FDA and  has been authorized for detection and/or diagnosis of SARS-CoV-2 by FDA under an Emergency Use Authorization (EUA). This EUA will remain  in effect (meaning this test can be used) for the duration of the COVID-19 declaration under Section 564(b)(1) of the Act, 21 U.S.C.section 360bbb-3(b)(1), unless the authorization is terminated  or revoked sooner.       Influenza A by PCR NEGATIVE NEGATIVE Final   Influenza B by PCR NEGATIVE NEGATIVE Final    Comment: (NOTE) The Xpert Xpress SARS-CoV-2/FLU/RSV plus assay is intended as an aid in the diagnosis of influenza from Nasopharyngeal swab specimens and should not be used as a sole basis for treatment. Nasal washings and aspirates are unacceptable for Xpert Xpress SARS-CoV-2/FLU/RSV testing.  Fact Sheet for Patients: EntrepreneurPulse.com.au  Fact Sheet for Healthcare Providers: IncredibleEmployment.be  This test is not yet approved or cleared by the [place] FDA and has been authorized for detection and/or diagnosis of SARS-CoV-2 by FDA under an Emergency Use Authorization (EUA). This EUA will remain in effect (meaning this test can be used) for the duration of the COVID-19 declaration under Section 564(b)(1) of the Act, 21 U.S.C. section 360bbb-3(b)(1), unless the authorization is terminated or revoked.  Performed at [facility] [address] [place], [place] [address]      Scheduled Meds:  heparin  5,000 Units Subcutaneous Q8H   levothyroxine  88 mcg Oral Daily   phenazopyridine  200 mg Oral TID WC   Continuous Infusions:  cefTRIAXone (ROCEPHIN)  IV Stopped (07/13/21 2051)   lactated ringers 150 mL/hr at 07/14/21 3825    Procedures/Studies: DG Chest Port 1 View  Result Date:  07/13/2021 CLINICAL DATA:  Altered mental status and suspected sepsis.  Fever. EXAM: PORTABLE CHEST 1 VIEW COMPARISON:  Chest radiograph 10/13/2015 FINDINGS: Low lung volumes. There is a large retrocardiac hiatal hernia, partially obscuring assessment of mediastinal contours. Heart is grossly normal in size. Minor bibasilar atelectasis. No confluent consolidation. No pleural fluid or pneumothorax. On limited assessment, no acute osseous abnormalities are seen. IMPRESSION: 1. Low lung volumes with bibasilar atelectasis. 2. Large retrocardiac hiatal hernia, partially obscuring assessment of mediastinal contours. Electronically Signed   By: Keith Rake M.D.   On: 07/13/2021 20:33    Orson Eva, DO  [facility]  If 7PM-7AM, please contact night-coverage [email or web address] Password [facility] 07/14/2021, 9:31 AM   LOS: 1 day

## 2021-07-14 NOTE — Hospital Course (Signed)
81 year old female with a history of left-sided breast cancer, temporal arteritis, hypothyroidism, overactive bladder, UTIs presenting with 3 to 4-day history of generalized weakness, poor oral intake, and fever.  The patient's spouse stated that her fever was up to 102.0 F on the day of admission.  The patient has been recently placed on Macrobid by her PCP.  She continued to have dysuria.  As result, she presented for further evaluation.  At the time of admission, the patient was somewhat confused.  According to the patient's spouse, the patient has had progressive memory loss for the last 6 months.  There was some concern that the patient's solifenacin may have been contributing, and this was stopped about 1 week prior to admission.  Spouse stated that her mental status had a little bit of improvement until the development of recurrent UTI.  The patient himself denies any headache, chest pain, shortness of breath, cough, hemoptysis, nausea, vomiting, diarrhea, abdominal pain.  She complains of dysuria. In the ED, the patient was febrile up to 102.8 F.  WBC 16.7, hemoglobin 11.1, platelets 376,000.  UA showed >50 WBC.  Sodium 136, potassium 2.5, bicarbonate 25, serum creatinine 0.95.  The patient was started on ceftriaxone.  Chest x-ray showed bibasilar atelectasis.

## 2021-07-14 NOTE — Assessment & Plan Note (Addendum)
UA >50 WBC Continue ceftriaxone pending culture data -urine culture final susceptibility pending but pt is clinically improved with improved mentation, improved WBC, and resolved sepsis physiology -d/c home with cefidinr x 4 more days

## 2021-07-15 DIAGNOSIS — G9341 Metabolic encephalopathy: Secondary | ICD-10-CM | POA: Diagnosis not present

## 2021-07-15 DIAGNOSIS — A419 Sepsis, unspecified organism: Secondary | ICD-10-CM

## 2021-07-15 DIAGNOSIS — E876 Hypokalemia: Secondary | ICD-10-CM

## 2021-07-15 DIAGNOSIS — N3000 Acute cystitis without hematuria: Secondary | ICD-10-CM

## 2021-07-15 LAB — CBC
HCT: 29.2 % — ABNORMAL LOW (ref 36.0–46.0)
Hemoglobin: 9.2 g/dL — ABNORMAL LOW (ref 12.0–15.0)
MCH: 30.6 pg (ref 26.0–34.0)
MCHC: 31.5 g/dL (ref 30.0–36.0)
MCV: 97 fL (ref 80.0–100.0)
Platelets: 270 10*3/uL (ref 150–400)
RBC: 3.01 MIL/uL — ABNORMAL LOW (ref 3.87–5.11)
RDW: 13.2 % (ref 11.5–15.5)
WBC: 11.3 10*3/uL — ABNORMAL HIGH (ref 4.0–10.5)
nRBC: 0 % (ref 0.0–0.2)

## 2021-07-15 LAB — BASIC METABOLIC PANEL
Anion gap: 8 (ref 5–15)
BUN: 9 mg/dL (ref 8–23)
CO2: 24 mmol/L (ref 22–32)
Calcium: 7.9 mg/dL — ABNORMAL LOW (ref 8.9–10.3)
Chloride: 105 mmol/L (ref 98–111)
Creatinine, Ser: 0.63 mg/dL (ref 0.44–1.00)
GFR, Estimated: >60 mL/min (ref >60)
Glucose, Bld: 93 mg/dL (ref 70–99)
Potassium: 3.3 mmol/L — ABNORMAL LOW (ref 3.5–5.1)
Sodium: 137 mmol/L (ref 135–145)

## 2021-07-15 LAB — MAGNESIUM: Magnesium: 2.1 mg/dL (ref 1.7–2.4)

## 2021-07-15 MED ORDER — CEFDINIR 300 MG PO CAPS
300.0000 mg | ORAL_CAPSULE | Freq: Two times a day (BID) | ORAL | Status: DC
  Administered 2021-07-15: 300 mg via ORAL
  Filled 2021-07-15: qty 1

## 2021-07-15 MED ORDER — POTASSIUM CHLORIDE CRYS ER 20 MEQ PO TBCR
20.0000 meq | EXTENDED_RELEASE_TABLET | Freq: Once | ORAL | Status: AC
  Administered 2021-07-15: 20 meq via ORAL
  Filled 2021-07-15: qty 1

## 2021-07-15 MED ORDER — CEFDINIR 300 MG PO CAPS: 300.0000 mg | ORAL_CAPSULE | Freq: Two times a day (BID) | ORAL | 0 refills | Status: DC

## 2021-07-15 NOTE — TOC Transition Note (Signed)
Transition of Care [facility]) - CM/SW Discharge Note   Patient Details  Name: Kim Krueger MRN: [id number] Date of Birth: 06/03/1940  Transition of Care [facility] CM/SW Contact:  Iona Beard, [place] Phone Number: 07/15/2021, 11:01 AM   Clinical Narrative:    [place] orders have been placed for [facility] PT and RN services. CSW updated Vaughan Basta with [facility] of plan for DC today. TOC signing.   Final next level of care: [place] Barriers to Discharge: Barriers Resolved   Patient Goals and CMS Choice Patient states their goals for this hospitalization and ongoing recovery are:: return home   Choice offered to / list presented to : Patient  Discharge Placement                       Discharge Plan and Services                          HH Arranged: RN, PT [facility] Agency: [place] ([facility]) Date [place]: 07/15/21 Time [place]: 1114 Representative spoke with at [place]: [place] Determinants of Health ([place]) Interventions     Readmission Risk Interventions     No data to display

## 2021-07-15 NOTE — Discharge Summary (Signed)
Physician Discharge Summary   Patient: Kim Krueger MRN: [id number] DOB: 07/21/1940  Admit date:     07/13/2021  Discharge date: 07/15/21  Discharge Physician: Shanon Brow Lakea Mittelman   PCP: Haywood Pao, MD   Recommendations at discharge:   Please follow up with primary care provider within 1-2 weeks  Please repeat BMP and CBC in one week     Hospital Course: 81 year old female with a history of left-sided breast cancer, temporal arteritis, hypothyroidism, overactive bladder, UTIs presenting with 3 to 4-day history of generalized weakness, poor oral intake, and fever.  The patient's spouse stated that her fever was up to 102.0 F on the day of admission.  The patient has been recently placed on Macrobid by her PCP.  She continued to have dysuria.  As result, she presented for further evaluation.  At the time of admission, the patient was somewhat confused.  According to the patient's spouse, the patient has had progressive memory loss for the last 6 months.  There was some concern that the patient's solifenacin may have been contributing, and this was stopped about 1 week prior to admission.  Spouse stated that her mental status had a little bit of improvement until the development of recurrent UTI.  The patient himself denies any headache, chest pain, shortness of breath, cough, hemoptysis, nausea, vomiting, diarrhea, abdominal pain.  She complains of dysuria. In the ED, the patient was febrile up to 102.8 F.  WBC 16.7, hemoglobin 11.1, platelets 376,000.  UA showed >50 WBC.  Sodium 136, potassium 2.5, bicarbonate 25, serum creatinine 0.95.  The patient was started on ceftriaxone.  Chest x-ray showed bibasilar atelectasis.  Assessment and Plan: * Sepsis due to undetermined organism [facility]) Patient presented with leukocytosis, tachypnea, and fever Secondary to UTI Continue IV fluids Continue empiric ceftriaxone pending culture data Chest x-ray negative for consolidation UA > 50 WBC Sepsis  physiology resolved  UTI (urinary tract infection) UA >50 WBC Continue ceftriaxone pending culture data -urine culture final susceptibility pending but pt is clinically improved with improved mentation, improved WBC, and resolved sepsis physiology -d/c home with cefidinr x 4 more days  Acute metabolic encephalopathy - Secondary to UTI -Electrolyte derangement could also be contributing with the potassium of 2.5 -spouse states pt is improving and near baseline at time of dc  History of left breast cancer Outpatient follow-up with Dr. Lindi Adie Continue anastrozole  Hypothyroidism - Continue Synthroid -TSH 0.230 -Repeat TSH in about 3 to 4 weeks after the patient is recovered from her illness  Prolonged QT interval - Secondary to hypokalemia -Monitor on telemetry -Hold QT prolonging agents when possible  Hypokalemia - Potassium 2.5 at time of admission -repleted -2 g of magnesium also given in the ED -07/14/2021 magnesium 2.2         Consultants: none Procedures performed: none  Disposition: Home Diet recommendation:  Cardiac diet DISCHARGE MEDICATION: Allergies as of 07/15/2021       Reactions   Ceftin [cefuroxime Axetil] Nausea Only   Caused sever yeast infection   Hydrocodone    Sulfa Antibiotics Other (See Comments)   Unknown childhood reaction after surgery    Tramadol Other (See Comments)   Cause pain to worsen    Penicillins Rash   Has patient had a PCN reaction causing immediate rash, facial/tongue/throat swelling, SOB or lightheadedness with hypotension: Yes Has patient had a PCN reaction causing severe rash involving mucus membranes or skin necrosis: No Has patient had a PCN reaction that required hospitalization No  Has patient had a PCN reaction occurring within the last 10 years: No If all of the above answers are "NO", then may proceed with Cephalosporin use.        Medication List     STOP taking these medications    nitrofurantoin  (macrocrystal-monohydrate) 100 MG capsule Commonly known as: MACROBID       TAKE these medications    acetaminophen 325 MG tablet Commonly known as: TYLENOL Take 650 mg by mouth every 6 (six) hours as needed for moderate pain.   anastrozole 1 MG tablet Commonly known as: ARIMIDEX TAKE 1 TABLET(1 MG) BY MOUTH DAILY What changed: See the new instructions.   cefdinir 300 MG capsule Commonly known as: OMNICEF Take 1 capsule (300 mg total) by mouth every 12 (twelve) hours.   levothyroxine 88 MCG tablet Commonly known as: SYNTHROID Take 1 tablet by mouth daily.        Follow-up Information     [facility].   Specialty: Home Health Services Why: Will contact you to schedule home health visits.               Discharge Exam: Filed Weights   07/13/21 1919 07/14/21 0027  Weight: 80 kg 71.2 kg   HEENT:  [place]/AT, No thrush, no icterus CV:  RRR, no rub, no S3, no S4 Lung:  CTA, no wheeze, no rhonchi Abd:  soft/+BS, NT Ext:  No edema, no lymphangitis, no synovitis, no rash   Condition at discharge: stable  The results of significant diagnostics from this hospitalization (including imaging, microbiology, ancillary and laboratory) are listed below for reference.   Imaging Studies: DG Chest Port 1 View  Result Date: 07/13/2021 CLINICAL DATA:  Altered mental status and suspected sepsis.  Fever. EXAM: PORTABLE CHEST 1 VIEW COMPARISON:  Chest radiograph 10/13/2015 FINDINGS: Low lung volumes. There is a large retrocardiac hiatal hernia, partially obscuring assessment of mediastinal contours. Heart is grossly normal in size. Minor bibasilar atelectasis. No confluent consolidation. No pleural fluid or pneumothorax. On limited assessment, no acute osseous abnormalities are seen. IMPRESSION: 1. Low lung volumes with bibasilar atelectasis. 2. Large retrocardiac hiatal hernia, partially obscuring assessment of mediastinal contours. Electronically Signed    By: Keith Rake M.D.   On: 07/13/2021 20:33    Microbiology: Results for orders placed or performed during the hospital encounter of 07/13/21  Culture, blood (Routine x 2)     Status: None (Preliminary result)   Collection Time: 07/13/21  7:26 PM   Specimen: BLOOD RIGHT HAND  Result Value Ref Range Status   Specimen Description BLOOD RIGHT HAND  Final   Special Requests   Final    BOTTLES DRAWN AEROBIC ONLY Blood Culture results may not be optimal due to an inadequate volume of blood received in culture bottles   Culture   Final    NO GROWTH 2 DAYS Performed at [facility], [address], [place], [place] [address]    Report Status PENDING  Incomplete  Culture, blood (Routine x 2)     Status: None (Preliminary result)   Collection Time: 07/13/21  7:31 PM   Specimen: Left Antecubital; Blood  Result Value Ref Range Status   Specimen Description LEFT ANTECUBITAL  Final   Special Requests   Final    BOTTLES DRAWN AEROBIC AND ANAEROBIC Blood Culture results may not be optimal due to an excessive volume of blood received in culture bottles   Culture   Final    NO  GROWTH 2 DAYS Performed at [facility], [address] [place], [place] [address]    Report Status PENDING  Incomplete  Resp Panel by RT-PCR (Flu A&B, Covid) Anterior Nasal Swab     Status: None   Collection Time: 07/13/21  7:53 PM   Specimen: Anterior Nasal Swab  Result Value Ref Range Status   SARS Coronavirus 2 by RT PCR NEGATIVE NEGATIVE Final    Comment: (NOTE) SARS-CoV-2 target nucleic acids are NOT DETECTED.  The SARS-CoV-2 RNA is generally detectable in upper respiratory specimens during the acute phase of infection. The lowest concentration of SARS-CoV-2 viral copies this assay can detect is 138 copies/mL. A negative result does not preclude SARS-Cov-2 infection and should not be used as the sole basis for treatment or other patient management decisions. A negative result may occur with  improper specimen  collection/handling, submission of specimen other than nasopharyngeal swab, presence of viral mutation(s) within the areas targeted by this assay, and inadequate number of viral copies(<138 copies/mL). A negative result must be combined with clinical observations, patient history, and epidemiological information. The expected result is Negative.  Fact Sheet for Patients:  EntrepreneurPulse.com.au  Fact Sheet for Healthcare Providers:  IncredibleEmployment.be  This test is no t yet approved or cleared by the [place] FDA and  has been authorized for detection and/or diagnosis of SARS-CoV-2 by FDA under an Emergency Use Authorization (EUA). This EUA will remain  in effect (meaning this test can be used) for the duration of the COVID-19 declaration under Section 564(b)(1) of the Act, 21 U.S.C.section 360bbb-3(b)(1), unless the authorization is terminated  or revoked sooner.       Influenza A by PCR NEGATIVE NEGATIVE Final   Influenza B by PCR NEGATIVE NEGATIVE Final    Comment: (NOTE) The Xpert Xpress SARS-CoV-2/FLU/RSV plus assay is intended as an aid in the diagnosis of influenza from Nasopharyngeal swab specimens and should not be used as a sole basis for treatment. Nasal washings and aspirates are unacceptable for Xpert Xpress SARS-CoV-2/FLU/RSV testing.  Fact Sheet for Patients: EntrepreneurPulse.com.au  Fact Sheet for Healthcare Providers: IncredibleEmployment.be  This test is not yet approved or cleared by the [place] FDA and has been authorized for detection and/or diagnosis of SARS-CoV-2 by FDA under an Emergency Use Authorization (EUA). This EUA will remain in effect (meaning this test can be used) for the duration of the COVID-19 declaration under Section 564(b)(1) of the Act, 21 U.S.C. section 360bbb-3(b)(1), unless the authorization is terminated or revoked.  Performed at [facility], [address] [place], [place] [address]   Urine Culture     Status: None (Preliminary result)   Collection Time: 07/13/21  8:20 PM   Specimen: In/Out Cath Urine  Result Value Ref Range Status   Specimen Description   Final    IN/OUT CATH URINE Performed at [facility], [address] [place], [place] [address]    Special Requests   Final    NONE Performed at [facility], [address] [place], [place] [address]    Culture   Final    CULTURE REINCUBATED FOR BETTER GROWTH Performed at [facility], [place] [address], [place], [place] [address]    Report Status PENDING  Incomplete    Labs: CBC: Recent Labs  Lab 07/13/21 1926 07/14/21 0438 07/15/21 0416  WBC 16.7* 14.4* 11.3*  NEUTROABS 14.4* 12.1*  --   HGB 11.1* 9.8* 9.2*  HCT 34.2* 30.0* 29.2*  MCV 94.7 95.2 97.0  PLT 376 304 035   Basic Metabolic Panel: Recent Labs  Lab 07/13/21 1926 07/14/21 0438 07/15/21 0416  NA 136 135 137  K 2.5* 3.0* 3.3*  CL 100 103 105  CO2 '25 23 24  '$ GLUCOSE 142* 113* 93  BUN '11 10 9  '$ CREATININE 0.95 0.73 0.63  CALCIUM 8.3* 7.9* 7.9*  MG  --  2.2 2.1   Liver Function Tests: Recent Labs  Lab 07/13/21 1926 07/14/21 0438  AST 15 13*  ALT 17 14  ALKPHOS 64 57  BILITOT 1.0 0.7  PROT 6.9 5.5*  ALBUMIN 3.1* 2.5*   CBG: No results for input(s): "GLUCAP" in the last 168 hours.  Discharge time spent: greater than 30 minutes.  Signed: Orson Eva, MD [facility] 07/15/2021

## 2021-07-15 NOTE — Progress Notes (Signed)
Physical Therapy Treatment Patient Details Name: Kim Krueger MRN: [id number] DOB: [birth year] Today's Date: 07/15/2021   History of Present Illness Kim Krueger is a 81 y.o. female with medical history significant of cancer with family history of cancer, history of hypothyroidism, and recurrent UTIs presents to the ED with a chief complaint of UTI issues.  When husband was with patient her chief complaint was actually altered mental status.  She seems perfectly coherent until you really drill her on details of her history, and then it is obvious that she is confused.  When you ask her if she was confused she reports that she still has.  So, she is aware of it.  Is unclear how accurate any of this history is, but she reports that she has had recurrent UTIs over the last year.  This episode has been going on for last 6 months, but then a different time when as she said 3 months, and then a different time when as she said 1 month.  Her favorite antibiotic for this issue is Cipro, and she is mad because her PCP will not give it to her.  She reports that she has been getting other antibiotics that do not do the job.  She thinks the last antibiotic she had was doxycycline.  She thinks that that antibiotic was about a month ago, but then at a different time she says that was last week.  She reports that she did not finish the course because it was not helping.  Her symptoms are dysuria, incontinence, frequency, urgency.  She has no hematuria, no fevers, no nausea or vomiting.  Patient reports no pain at this time.  She has never tried Pyridium but she is interested in trying it because she does have burning with urination.  Patient has no other complaints at this time.    PT Comments    Pt in chair with husband in room and willing to participate with therapy today.  Pt presents with safe mechanics with transfers with good hand placement and able to complete with SBA.  Pt unsteady upon standing, utilized RW  with gait this session.  Pt with need for bowel movement prior gait, reports increased fatigue following restroom break with seated rest break prior gait.  Decreased distance noted during gait, did require cueing to stand within walker for safety with gait.  Increased time and some difficulty turning around in hallway, wide turn.  EOS pt left in chair with husband in room and NT notified for pure wick.  No reports of pain through session.    Recommendations for follow up therapy are one component of a multi-disciplinary discharge planning process, led by the attending physician.  Recommendations may be updated based on patient status, additional functional criteria and insurance authorization.  Follow Up Recommendations  Home health PT     Assistance Recommended at Discharge Set up Supervision/Assistance  Patient can return home with the following A little help with walking and/or transfers;Help with stairs or ramp for entrance;Assistance with cooking/housework   Equipment Recommendations  None recommended by PT    Recommendations for Other Services       Precautions / Restrictions Precautions Precautions: Fall Restrictions Weight Bearing Restrictions: No     Mobility  Bed Mobility               General bed mobility comments: Sitting in chair upon therapist entrance    Transfers Overall transfer level: Modified independent Equipment used: None Transfers: Sit to/from  Stand Sit to Stand: Supervision (STS from chair with hand push from chair arms, need for rails on wall from toilet)           General transfer comment: Pt able to demonstrate good mechanics wiht STS, supervision only with no AD for STS.  Presents wtih forward trunk lean impacting balance, used RW for gait    Ambulation/Gait Ambulation/Gait assistance: Counsellor (Feet): 55 Feet Assistive device: Rolling walker (2 wheels) Gait Pattern/deviations: Step-to pattern, Decreased step length -  left, Decreased step length - right, Decreased stride length, Trunk flexed, Narrow base of support Gait velocity: decreased     General Gait Details: Decreased activity tolerance following restroom break, use RW for balance, verbal cueing needed to stand within walker.  Difficulty with turning and lacks spatial awareness.   Stairs             Wheelchair Mobility    Modified Rankin (Stroke Patients Only)       Balance                                            Cognition Arousal/Alertness: Awake/alert Behavior During Therapy: WFL for tasks assessed/performed Overall Cognitive Status: Within Functional Limits for tasks assessed                                          Exercises      General Comments        Pertinent Vitals/Pain Pain Assessment Pain Assessment: No/denies pain    Home Living                          Prior Function            PT Goals (current goals can now be found in the care plan section)      Frequency    Min 3X/week      PT Plan Current plan remains appropriate    Co-evaluation              AM-PAC PT "6 Clicks" Mobility   Outcome Measure  Help needed turning from your back to your side while in a flat bed without using bedrails?: None Help needed moving from lying on your back to sitting on the side of a flat bed without using bedrails?: None Help needed moving to and from a bed to a chair (including a wheelchair)?: A Little Help needed standing up from a chair using your arms (e.g., wheelchair or bedside chair)?: A Little Help needed to walk in hospital room?: A Little Help needed climbing 3-5 steps with a railing? : A Lot 6 Click Score: 19    End of Session Equipment Utilized During Treatment: Gait belt Activity Tolerance: Patient tolerated treatment well;No increased pain;Patient limited by fatigue Patient left: in chair;with call bell/phone within reach;with  family/visitor present Nurse Communication: Mobility status PT Visit Diagnosis: Unsteadiness on feet (R26.81);Other abnormalities of gait and mobility (R26.89);Muscle weakness (generalized) (M62.81)     Time: 7829-5621 PT Time Calculation (min) (ACUTE ONLY): 26 min  Charges:  $Therapeutic Activity: 8-22 mins                     Ihor Austin, LPTA/CLT; CBIS [phone]  Nickola Major,  Tessie Eke 07/15/2021, 1:09 PM

## 2021-07-15 NOTE — Progress Notes (Signed)
Nsg Discharge Note  Admit Date:  07/13/2021 Discharge date: 07/15/2021   Keenan Bachelor to be D/C'd Home per MD order.  AVS completed.  Copy for chart, and copy for patient signed, and dated. Patient/caregiver able to verbalize understanding.  Discharge Medication: Allergies as of 07/15/2021       Reactions   Ceftin [cefuroxime Axetil] Nausea Only   Caused sever yeast infection   Hydrocodone    Sulfa Antibiotics Other (See Comments)   Unknown childhood reaction after surgery    Tramadol Other (See Comments)   Cause pain to worsen    Penicillins Rash   Has patient had a PCN reaction causing immediate rash, facial/tongue/throat swelling, SOB or lightheadedness with hypotension: Yes Has patient had a PCN reaction causing severe rash involving mucus membranes or skin necrosis: No Has patient had a PCN reaction that required hospitalization No Has patient had a PCN reaction occurring within the last 10 years: No If all of the above answers are "NO", then may proceed with Cephalosporin use.        Medication List     STOP taking these medications    nitrofurantoin (macrocrystal-monohydrate) 100 MG capsule Commonly known as: MACROBID       TAKE these medications    acetaminophen 325 MG tablet Commonly known as: TYLENOL Take 650 mg by mouth every 6 (six) hours as needed for moderate pain.   anastrozole 1 MG tablet Commonly known as: ARIMIDEX TAKE 1 TABLET(1 MG) BY MOUTH DAILY What changed: See the new instructions.   cefdinir 300 MG capsule Commonly known as: OMNICEF Take 1 capsule (300 mg total) by mouth every 12 (twelve) hours.   levothyroxine 88 MCG tablet Commonly known as: SYNTHROID Take 1 tablet by mouth daily.        Discharge Assessment: Vitals:   07/15/21 0442 07/15/21 0956  BP: (!) 166/75   Pulse: 78 78  Resp: 19   Temp: 98.5 F (36.9 C)   SpO2: 97% 96%   Skin clean, dry and intact without evidence of skin break down, no evidence of skin tears  noted. IV catheter discontinued intact. Site without signs and symptoms of complications - no redness or edema noted at insertion site, patient denies c/o pain - only slight tenderness at site.  Dressing with slight pressure applied.  D/c Instructions-Education: Discharge instructions given to patient/family with verbalized understanding. D/c education completed with patient/family including follow up instructions, medication list, d/c activities limitations if indicated, with other d/c instructions as indicated by MD - patient able to verbalize understanding, all questions fully answered. Patient instructed to return to ED, call 911, or call MD for any changes in condition.  Patient escorted via [place], and D/C home via private auto.  Dorcas Mcmurray, LPN 9/41/7408 14:48 PM

## 2021-07-16 DIAGNOSIS — N390 Urinary tract infection, site not specified: Secondary | ICD-10-CM | POA: Diagnosis not present

## 2021-07-16 DIAGNOSIS — E039 Hypothyroidism, unspecified: Secondary | ICD-10-CM | POA: Diagnosis not present

## 2021-07-16 DIAGNOSIS — M316 Other giant cell arteritis: Secondary | ICD-10-CM | POA: Diagnosis not present

## 2021-07-16 DIAGNOSIS — E8809 Other disorders of plasma-protein metabolism, not elsewhere classified: Secondary | ICD-10-CM | POA: Diagnosis not present

## 2021-07-16 LAB — URINE CULTURE: Culture: 2000 — AB

## 2021-07-18 LAB — CULTURE, BLOOD (ROUTINE X 2)
Culture: NO GROWTH
Culture: NO GROWTH

## 2021-07-22 ENCOUNTER — Encounter: Payer: Self-pay | Admitting: Physician Assistant

## 2021-07-22 ENCOUNTER — Ambulatory Visit: Payer: Medicare Other | Admitting: Physician Assistant

## 2021-07-22 VITALS — Resp 18 | Ht 63.5 in | Wt 159.0 lb

## 2021-07-22 DIAGNOSIS — G309 Alzheimer's disease, unspecified: Secondary | ICD-10-CM | POA: Diagnosis not present

## 2021-07-22 DIAGNOSIS — R413 Other amnesia: Secondary | ICD-10-CM | POA: Diagnosis not present

## 2021-07-22 DIAGNOSIS — F0280 Dementia in other diseases classified elsewhere without behavioral disturbance: Secondary | ICD-10-CM

## 2021-07-22 MED ORDER — MEMANTINE HCL 5 MG PO TABS: ORAL_TABLET | ORAL | 11 refills | Status: DC

## 2021-07-22 NOTE — Progress Notes (Signed)
Assessment/Plan:    The patient is seen in neurologic consultation at the request of Tisovec, Fransico Him, MD for the evaluation of memory.  Kim Krueger is a very pleasant 81 y.o. year old RH female with  a history of hypertension, hyperlipidemia, recent sepsis (06/2021), history of temporal arteritis, hypothyroidism, OAB with recurring UTIs, prolonged QT interval,  seen today for evaluation of memory loss. MoCA today is 17/30 with delayed recall 0/5 (memory also 0/5), visuospatial 0/5, finding suspicious for Alzheimer's disease    Memory Loss Dementia due to Alzheimer's disease   MRI brain with/without contrast to assess for underlying structural abnormality and assess vascular load   Start Memantine 10 mg: Take 1 tablet (10 mg at night) for 2 weeks, then increase to 1 tablet (10 mg) twice a day.   Side effects discussed Neurocognitive testing to further evaluate cognitive concerns and determine other underlying cause of memory changes, including potential contribution from sleep, anxiety, or depression  Folllow up in 6 months  Subjective:    The patient is accompanied by her son and daughter in law who supplements the history.    How long did patient have memory difficulties?  She reports that her memory worsened  over the last  4-6  months. "Took a medicine when I was in the hospital  that may have had something to do with it" Patient lives with: Spouse who reports that her memory is worse over the last 6 months  "couldn't remember the date and time" . Daughter reports that her memory deficits are"longer than that,maybe 9 mo to  year,  writing things all the time such as conversations and appointments because she otherwise could not remember"  repeats oneself? Endorsed Disoriented when walking into a room?  Patient denies   Leaving objects in unusual places?  Patient denies  but  husband  reports that she places her purse anywhere. " The other day she cod not remember where she left  her phone we moved the house upside down and then she realized the phone was in her pocket "  Ambulates  with difficulty?   Patient denies   Recent falls?  Endorsed " I have some knee issues so  I use a walker when needed " Any head injuries?  Patient denies   History of seizures?   Patient denies   Wandering behavior?  Patient denies   Patient drives?   Patient drives very short distances " to the bank, beauty salon, grocery and drugstore" Any mood changes such irritability agitation? "She is a lot nicer than she was"- daughter says  Any history of depression?:  Patient denies   Hallucinations?  Patient denies at this moment, "only when I was in the hospital"   Paranoia?  Patient denies   Patient reports that he sleeps well without vivid dreams, REM behavior or sleepwalking   History of sleep apnea?  Patient denies   Any hygiene concerns?  Patient denies   Independent of bathing and dressing?  Endorsed  Does the patient needs help with medications?  Husband in charge Who is in charge of the finances? Husband is in charge    Any changes in appetite?  Patient denies, but " she doesn't drink enough water"  Patient have trouble swallowing? Patient denies   Does the patient cook?  Patient denies   Any kitchen accidents such as leaving the stove on? Husband does the cooking  Any headaches?  Patient denies  TA  Double vision?  Patient denies   Any focal numbness or tingling?  Patient denies   Chronic back pain Patient denies   Unilateral weakness?  Patient denies   Any tremors?  Patient denies   Any history of anosmia?  Patient denies   Any incontinence of urine?  Incontinence wears pads. Frequent UTIs Any bowel dysfunction?   Patient denies    History of heavy alcohol intake?  Patient denies   History of heavy tobacco use?  Patient denies   Family history of dementia?   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 TSH 0.230  Allergies  Allergen Reactions   Ceftin [Cefuroxime Axetil] Nausea Only    Caused sever yeast infection   Hydrocodone    Other    Sulfa Antibiotics Other (See Comments)    Unknown childhood reaction after surgery    Tramadol Other (See Comments)    Cause pain to worsen    Penicillins Rash    Has patient had a PCN reaction causing immediate rash, facial/tongue/throat swelling, SOB or lightheadedness with hypotension: Yes Has patient had a PCN reaction causing severe rash involving mucus membranes or skin necrosis: No Has patient had a PCN reaction that required hospitalization No Has patient had a PCN reaction occurring within the last 10 years: No If all of the above answers are "NO", then may proceed with Cephalosporin use.     Current Outpatient Medications  Medication Instructions   acetaminophen (TYLENOL) 650 mg, Oral, Every 6 hours PRN   anastrozole (ARIMIDEX) 1 MG tablet TAKE 1 TABLET(1 MG) BY MOUTH DAILY   cefdinir (OMNICEF) 300 mg, Oral, Every 12 hours   levothyroxine (SYNTHROID) 88 MCG tablet 1 tablet, Oral, Daily   memantine (NAMENDA) 5 MG tablet Take 1 tablet (5 mg at night) for 2 weeks, then increase to 1 tablet (5 mg) twice a day     VITALS:   Vitals:   07/22/21 0940  Resp: 18  Weight: 159 lb (72.1 kg)  Height: 5' 3.5" (1.613 m)       No data to display          PHYSICAL EXAM   HEENT:  Normocephalic, atraumatic. The mucous membranes are moist. The superficial temporal arteries are without ropiness or tenderness. Cardiovascular: Regular rate and rhythm. Lungs: Clear to auscultation bilaterally. Neck: There are no carotid bruits noted bilaterally.  NEUROLOGICAL:    07/24/2021    3:00 PM  Montreal Cognitive Assessment   Visuospatial/ Executive (0/5) 0  Naming (0/3) 3  Attention: Read list of digits (0/2) 2  Attention: Read list of letters (0/1) 1  Attention: Serial 7 subtraction starting at 100 (0/3) 2  Language:  Repeat phrase (0/2) 2  Language : Fluency (0/1) 0  Abstraction (0/2) 2  Delayed Recall (0/5) 0  Orientation (0/6) 5  Total 17  Adjusted Score (based on education) 17        No data to display           Orientation:  Alert and oriented to person, place and time. Flat affect  No aphasia or dysarthria. Fund of knowledge is appropriate. Recent and remote memory is impaired . Attention and concentration are reduced.  Able to name objects and repeat phrases. Delayed recall 0/5   Cranial nerves: There is good facial symmetry. Extraocular muscles are intact and visual fields are full to confrontational testing. Speech is fluent and clear. Soft palate rises symmetrically and there  is no tongue deviation. Hearing is intact to conversational tone. Tone: Tone is good throughout. Sensation: Sensation is intact to light touch and pinprick throughout. Vibration is intact at the bilateral big toe.There is no extinction with double simultaneous stimulation. There is no sensory dermatomal level identified. Coordination: The patient has no difficulty with RAM's or FNF bilaterally. Normal finger to nose  Motor: Strength is 5/5 in the bilateral upper and lower extremities. There is no pronator drift. There are no fasciculations noted. DTR's: Deep tendon reflexes are 2/4 at the bilateral biceps, triceps, brachioradialis, patella and achilles.  Plantar responses are downgoing bilaterally. Gait and Station: The patient is able to ambulate without difficulty.The patient is able to heel toe walk without any difficulty.The patient is able to ambulate in a tandem fashion. The patient is able to stand in the Romberg position.     Thank you for allowing [place] the opportunity to participate in the care of this nice patient. Please do not hesitate to contact us for any questions or concerns.   Total time spent on today's visit was 62 minutes dedicated to this patient today, preparing to see patient, examining the patient,  ordering tests and/or medications and counseling the patient, documenting clinical information in the EHR or other health record, independently interpreting results and communicating results to the patient/family, discussing treatment and goals, answering patient's questions and coordinating care.  Cc:  Tisovec, Fransico Him, MD  Sharene Butters 07/24/2021 3:13 PM

## 2021-07-22 NOTE — Patient Instructions (Addendum)
It was a pleasure to see you today at our office.   Recommendations:  Follow up in 6 months Memantine 10 mg twice daily.Side effects were discussed  MRI brain  Neurocognitive testing  Whom to call:  Memory  decline, memory medications: Call our office [phone]   For psychiatric meds, mood meds: Please have your primary care physician manage these medications.   Counseling regarding caregiver distress, including caregiver depression, anxiety and issues regarding community resources, adult day care programs, adult living facilities, or memory care questions:   Feel free to contact [place], Social Worker at [phone]   For assessment of decision of mental capacity and competency:  Call Dr. Anthoney Harada, geriatric psychiatrist at [phone]  For guidance in geriatric dementia issues please call [facility] [phone]  For guidance regarding [facility] and if placement were needed at the facility, contact Arnell Asal, Social Worker tel: [phone]  If you have any severe symptoms of a stroke, or other severe issues such as confusion,severe chills or fever, etc call 911 or go to the ER as you may need to be evaluated further   Feel free to visit Facebook page " [facility]" for tips of how to care for people with memory problems.      RECOMMENDATIONS FOR ALL PATIENTS WITH MEMORY PROBLEMS: 1. Continue to exercise (Recommend 30 minutes of walking everyday, or 3 hours every week) 2. Increase social interactions - continue going to [place] and enjoy social gatherings with friends and family 3. Eat healthy, avoid fried foods and eat more fruits and vegetables 4. Maintain adequate blood pressure, blood sugar, and blood cholesterol level. Reducing the risk of stroke and cardiovascular disease also helps promoting better memory. 5. Avoid stressful situations. Live a simple life and avoid aggravations. Organize your time and prepare for  the next day in anticipation. 6. Sleep well, avoid any interruptions of sleep and avoid any distractions in the bedroom that may interfere with adequate sleep quality 7. Avoid sugar, avoid sweets as there is a strong link between excessive sugar intake, diabetes, and cognitive impairment We discussed the Mediterranean diet, which has been shown to help patients reduce the risk of progressive memory disorders and reduces cardiovascular risk. This includes eating fish, eat fruits and green leafy vegetables, nuts like almonds and hazelnuts, walnuts, and also use olive oil. Avoid fast foods and fried foods as much as possible. Avoid sweets and sugar as sugar use has been linked to worsening of memory function.  There is always a concern of gradual progression of memory problems. If this is the case, then we may need to adjust level of care according to patient needs. Support, both to the patient and caregiver, should then be put into place.    The Alzheimer's Association is here all day, every day for people facing Alzheimer's disease through our free 24/7 Helpline: 908-388-2943. The Helpline provides reliable information and support to all those who need assistance, such as individuals living with memory loss, Alzheimer's or other dementia, caregivers, health care professionals and the public.  Our highly trained and knowledgeable staff can help you with: Understanding memory loss, dementia and Alzheimer's  Medications and other treatment options  General information about aging and brain health  Skills to provide quality care and to find the best care from professionals  Legal, financial and living-arrangement decisions Our Helpline also features: Confidential care consultation provided by master's level clinicians who can help with decision-making support, crisis assistance and education  on issues families face every day  Help in a caller's preferred language using our translation service that  features more than 200 languages and dialects  Referrals to local community programs, services and ongoing support     FALL PRECAUTIONS: Be cautious when walking. Scan the area for obstacles that may increase the risk of trips and falls. When getting up in the mornings, sit up at the edge of the bed for a few minutes before getting out of bed. Consider elevating the bed at the head end to avoid drop of blood pressure when getting up. Walk always in a well-lit room (use night lights in the walls). Avoid area rugs or power cords from appliances in the middle of the walkways. Use a walker or a cane if necessary and consider physical therapy for balance exercise. Get your eyesight checked regularly.  FINANCIAL OVERSIGHT: Supervision, especially oversight when making financial decisions or transactions is also recommended.  HOME SAFETY: Consider the safety of the kitchen when operating appliances like stoves, microwave oven, and blender. Consider having supervision and share cooking responsibilities until no longer able to participate in those. Accidents with firearms and other hazards in the house should be identified and addressed as well.   ABILITY TO BE LEFT ALONE: If patient is unable to contact 911 operator, consider using LifeLine, or when the need is there, arrange for someone to stay with patients. Smoking is a fire hazard, consider supervision or cessation. Risk of wandering should be assessed by caregiver and if detected at any point, supervision and safe proof recommendations should be instituted.  MEDICATION SUPERVISION: Inability to self-administer medication needs to be constantly addressed. Implement a mechanism to ensure safe administration of the medications.   DRIVING: Regarding driving, in patients with progressive memory problems, driving will be impaired. We advise to have someone else do the driving if trouble finding directions or if minor accidents are reported. Independent  driving assessment is available to determine safety of driving.   If you are interested in the driving assessment, you can contact the following:  [employer] in [place]  [place] [phone] or [phone]      [place] refers to food and lifestyle choices that are based on the traditions of countries located on the [employer]. This way of eating has been shown to help prevent certain conditions and improve outcomes for people who have chronic diseases, like kidney disease and heart disease. What are tips for following this plan? Lifestyle  Cook and eat meals together with your family, when possible. Drink enough fluid to keep your urine clear or pale yellow. Be physically active every day. This includes: Aerobic exercise like running or swimming. Leisure activities like gardening, walking, or housework. Get 7-8 hours of sleep each night. If recommended by your health care provider, drink red wine in moderation. This means 1 glass a day for nonpregnant women and 2 glasses a day for men. A glass of wine equals 5 oz (150 mL). Reading food labels  Check the serving size of packaged foods. For foods such as rice and pasta, the serving size refers to the amount of cooked product, not dry. Check the total fat in packaged foods. Avoid foods that have saturated fat or trans fats. Check the ingredients list for added sugars, such as corn syrup. Shopping  At the grocery store, buy most of your food from the areas near  the walls of the store. This includes: Fresh fruits and vegetables (produce). Grains, beans, nuts, and seeds. Some of these may be available in unpackaged forms or large amounts (in bulk). Fresh seafood. Poultry and eggs. Low-fat dairy products. Buy whole ingredients instead of prepackaged foods. Buy fresh fruits and  vegetables in-season from local farmers markets. Buy frozen fruits and vegetables in resealable bags. If you do not have access to quality fresh seafood, buy precooked frozen shrimp or canned fish, such as tuna, salmon, or sardines. Buy small amounts of raw or cooked vegetables, salads, or olives from the deli or salad bar at your store. Stock your pantry so you always have certain foods on hand, such as olive oil, canned tuna, canned tomatoes, rice, pasta, and beans. Cooking  Cook foods with extra-virgin olive oil instead of using butter or other vegetable oils. Have meat as a side dish, and have vegetables or grains as your main dish. This means having meat in small portions or adding small amounts of meat to foods like pasta or stew. Use beans or vegetables instead of meat in common dishes like chili or lasagna. Experiment with different cooking methods. Try roasting or broiling vegetables instead of steaming or sauteing them. Add frozen vegetables to soups, stews, pasta, or rice. Add nuts or seeds for added healthy fat at each meal. You can add these to yogurt, salads, or vegetable dishes. Marinate fish or vegetables using olive oil, lemon juice, garlic, and fresh herbs. Meal planning  Plan to eat 1 vegetarian meal one day each week. Try to work up to 2 vegetarian meals, if possible. Eat seafood 2 or more times a week. Have healthy snacks readily available, such as: Vegetable sticks with hummus. Greek yogurt. Fruit and nut trail mix. Eat balanced meals throughout the week. This includes: Fruit: 2-3 servings a day Vegetables: 4-5 servings a day Low-fat dairy: 2 servings a day Fish, poultry, or lean meat: 1 serving a day Beans and legumes: 2 or more servings a week Nuts and seeds: 1-2 servings a day Whole grains: 6-8 servings a day Extra-virgin olive oil: 3-4 servings a day Limit red meat and sweets to only a few servings a month What are my food choices? Mediterranean  diet Recommended Grains: Whole-grain pasta. Brown rice. Bulgar wheat. Polenta. Couscous. Whole-wheat bread. [place]. Vegetables: Artichokes. Beets. Broccoli. Cabbage. Carrots. Eggplant. Green beans. Chard. Kale. Spinach. Onions. Leeks. Peas. Squash. Tomatoes. Peppers. Radishes. Fruits: Apples. Apricots. Avocado. Berries. Bananas. Cherries. Dates. Figs. Grapes. Lemons. Melon. Oranges. Peaches. Plums. Pomegranate. Meats and other protein foods: Beans. Almonds. Sunflower seeds. Pine nuts. Peanuts. [place]. Salmon. Scallops. Shrimp. [place]. Tilapia. Clams. Oysters. Eggs. Dairy: Low-fat milk. Cheese. Greek yogurt. Beverages: Water. Red wine. Herbal tea. Fats and oils: Extra virgin olive oil. Avocado oil. Grape seed oil. Sweets and desserts: [place] yogurt with honey. Baked apples. Poached pears. Trail mix. Seasoning and other foods: Basil. Cilantro. Coriander. Cumin. Mint. Parsley. Sage. Rosemary. Tarragon. Garlic. Oregano. Thyme. Pepper. Balsalmic vinegar. Tahini. Hummus. Tomato sauce. Olives. Mushrooms. Limit these Grains: Prepackaged pasta or rice dishes. Prepackaged cereal with added sugar. Vegetables: Deep fried potatoes (french fries). Fruits: Fruit canned in syrup. Meats and other protein foods: Beef. Pork. Lamb. Poultry with skin. Hot dogs. [place] Salines. Dairy: Ice cream. Sour cream. Whole milk. Beverages: Juice. Sugar-sweetened soft drinks. Beer. Liquor and spirits. Fats and oils: Butter. Canola oil. Vegetable oil. Beef fat (tallow). Lard. Sweets and desserts: Cookies. Cakes. Pies. Candy. Seasoning and other foods: Mayonnaise. Premade sauces and marinades. The  items listed may not be a complete list. Talk with your dietitian about what dietary choices are right for you. Summary The Mediterranean diet includes both food and lifestyle choices. Eat a variety of fresh fruits and vegetables, beans, nuts, seeds, and whole grains. Limit the amount of red meat and sweets that you eat. Talk with your  health care provider about whether it is safe for you to drink red wine in moderation. This means 1 glass a day for nonpregnant women and 2 glasses a day for men. A glass of wine equals 5 oz (150 mL). This information is not intended to replace advice given to you by your health care provider. Make sure you discuss any questions you have with your health care provider. Document Released: 08/27/2015 Document Revised: 09/29/2015 Document Reviewed: 08/27/2015 Elsevier Interactive Patient Education  2017 [employer].   We have sent a referral to [facility] for your MRI and they will call you directly to schedule your appointment. They are located at [place]. If you need to contact them directly please call [phone].

## 2021-07-28 DIAGNOSIS — N3000 Acute cystitis without hematuria: Secondary | ICD-10-CM | POA: Diagnosis not present

## 2021-07-28 DIAGNOSIS — A419 Sepsis, unspecified organism: Secondary | ICD-10-CM | POA: Diagnosis not present

## 2021-07-28 DIAGNOSIS — N390 Urinary tract infection, site not specified: Secondary | ICD-10-CM | POA: Diagnosis not present

## 2021-07-28 DIAGNOSIS — R413 Other amnesia: Secondary | ICD-10-CM | POA: Diagnosis not present

## 2021-07-28 DIAGNOSIS — E876 Hypokalemia: Secondary | ICD-10-CM | POA: Diagnosis not present

## 2021-08-03 ENCOUNTER — Other Ambulatory Visit: Payer: Medicare Other

## 2021-08-13 ENCOUNTER — Ambulatory Visit
Admission: RE | Admit: 2021-08-13 | Discharge: 2021-08-13 | Disposition: A | Discharge status: Home / Self Care | Payer: Medicare Other | Source: Ambulatory Visit | Attending: Physician Assistant | Admitting: Physician Assistant

## 2021-08-13 DIAGNOSIS — R413 Other amnesia: Secondary | ICD-10-CM | POA: Diagnosis not present

## 2021-08-13 DIAGNOSIS — G319 Degenerative disease of nervous system, unspecified: Secondary | ICD-10-CM | POA: Diagnosis not present

## 2021-08-15 DIAGNOSIS — R9431 Abnormal electrocardiogram [ECG] [EKG]: Secondary | ICD-10-CM | POA: Diagnosis not present

## 2021-08-15 DIAGNOSIS — N3281 Overactive bladder: Secondary | ICD-10-CM | POA: Diagnosis not present

## 2021-08-15 DIAGNOSIS — M316 Other giant cell arteritis: Secondary | ICD-10-CM | POA: Diagnosis not present

## 2021-08-15 DIAGNOSIS — Z853 Personal history of malignant neoplasm of breast: Secondary | ICD-10-CM | POA: Diagnosis not present

## 2021-08-15 DIAGNOSIS — E8809 Other disorders of plasma-protein metabolism, not elsewhere classified: Secondary | ICD-10-CM | POA: Diagnosis not present

## 2021-08-15 DIAGNOSIS — Z79891 Long term (current) use of opiate analgesic: Secondary | ICD-10-CM | POA: Diagnosis not present

## 2021-08-15 DIAGNOSIS — E039 Hypothyroidism, unspecified: Secondary | ICD-10-CM | POA: Diagnosis not present

## 2021-08-15 DIAGNOSIS — N390 Urinary tract infection, site not specified: Secondary | ICD-10-CM | POA: Diagnosis not present

## 2021-08-18 ENCOUNTER — Telehealth: Payer: Self-pay | Admitting: Physician Assistant

## 2021-08-18 NOTE — Telephone Encounter (Signed)
Pt husband called and states that they would like a call back about the MRI results

## 2021-08-18 NOTE — Telephone Encounter (Signed)
Patient advised and thanked me for calling and very appreciative

## 2021-08-18 NOTE — Telephone Encounter (Signed)
To call back at  4:03pm 08/18/2021

## 2021-08-30 DIAGNOSIS — N390 Urinary tract infection, site not specified: Secondary | ICD-10-CM | POA: Diagnosis not present

## 2021-09-07 DIAGNOSIS — H40013 Open angle with borderline findings, low risk, bilateral: Secondary | ICD-10-CM | POA: Diagnosis not present

## 2021-09-07 DIAGNOSIS — H25813 Combined forms of age-related cataract, bilateral: Secondary | ICD-10-CM | POA: Diagnosis not present

## 2021-09-07 DIAGNOSIS — M316 Other giant cell arteritis: Secondary | ICD-10-CM | POA: Diagnosis not present

## 2021-09-29 ENCOUNTER — Other Ambulatory Visit: Payer: Self-pay | Admitting: Hematology and Oncology

## 2021-10-05 ENCOUNTER — Telehealth: Payer: Self-pay | Admitting: Hematology and Oncology

## 2021-10-05 NOTE — Telephone Encounter (Signed)
Rescheduled appointment per patient and 9/11 staff message. She is aware of the changes made to her upcoming appointment.

## 2021-10-06 ENCOUNTER — Other Ambulatory Visit: Payer: Self-pay

## 2021-10-06 DIAGNOSIS — Z170 Estrogen receptor positive status [ER+]: Secondary | ICD-10-CM

## 2021-10-06 NOTE — Progress Notes (Signed)
Pt called and states she has a visit scheduled with Dr Lindi Adie and she needs to schedule a MM. Pt's last MM was 02/06/20 at [facility] mammography. Pt states she wishes to have her MM and [place] and if this is a problem with scheduling she will call us back to change locations back to [facility].

## 2021-10-29 ENCOUNTER — Other Ambulatory Visit: Payer: Self-pay | Admitting: Hematology and Oncology

## 2021-11-04 ENCOUNTER — Ambulatory Visit: Payer: Medicare Other | Admitting: Urology

## 2021-11-04 ENCOUNTER — Encounter: Payer: Self-pay | Admitting: Urology

## 2021-11-04 VITALS — BP 157/79 | HR 80 | Ht 63.0 in | Wt 159.0 lb

## 2021-11-04 DIAGNOSIS — R8290 Unspecified abnormal findings in urine: Secondary | ICD-10-CM | POA: Diagnosis not present

## 2021-11-04 DIAGNOSIS — N3281 Overactive bladder: Secondary | ICD-10-CM | POA: Diagnosis not present

## 2021-11-04 DIAGNOSIS — Z87440 Personal history of urinary (tract) infections: Secondary | ICD-10-CM | POA: Diagnosis not present

## 2021-11-04 LAB — URINALYSIS, ROUTINE W REFLEX MICROSCOPIC
Bilirubin, UA: NEGATIVE
Glucose, UA: NEGATIVE
Ketones, UA: NEGATIVE
Nitrite, UA: NEGATIVE
Specific Gravity, UA: 1.015 (ref 1.005–1.030)
Urobilinogen, Ur: 0.2 mg/dL (ref 0.2–1.0)
pH, UA: 5 (ref 5.0–7.5)

## 2021-11-04 LAB — MICROSCOPIC EXAMINATION: WBC, UA: >30 /hpf — ABNORMAL HIGH (ref 0–5)

## 2021-11-04 LAB — BLADDER SCAN AMB NON-IMAGING: Scan Result: 46

## 2021-11-04 MED ORDER — GEMTESA 75 MG PO TABS: 75.0000 mg | ORAL_TABLET | Freq: Every day | ORAL | 0 refills | Status: DC

## 2021-11-04 MED ORDER — CIPROFLOXACIN HCL 500 MG PO TABS: 500.0000 mg | ORAL_TABLET | Freq: Two times a day (BID) | ORAL | 0 refills | Status: DC

## 2021-11-04 NOTE — Progress Notes (Signed)
Assessment: 1. History of UTI   2. OAB (overactive bladder)   3. Abnormal urine findings     Plan: I reviewed the patient's chart including her prior office notes and lab results. Urine culture sent today. Begin Cipro 500 mg twice daily x5 days.  Prescription sent. Trial of Gemtesa 75 mg daily.  Samples provided. Return to office with Dr. Diona Fanti in approximately 1 month.  Chief Complaint: Chief Complaint  Patient presents with   Urinary Tract Infection    HPI: Kim Krueger is a 81 y.o. female who presents for continued evaluation of recurrent UTIs and overactive bladder symptoms.  She was last seen by Dr. Diona Fanti in March 2023.  She has also been seen by [facility].  She has been treated with Solifenacin for her OAB symptoms with good results.  This was discontinued in June 2023 due to concerns about progressive memory loss.  She was diagnosed with a UTI in June 2023.  Urine culture grew 2000 colonies of Klebsiella.  She presents today for evaluation of increased urinary symptoms and possible UTI.  She reports she has been treated for UTIs by her PCP in the past few months.  She was last treated with Cipro with resolution of her symptoms.  She has had her baseline symptoms of frequency, urgency, and urge incontinence.  She has noted dysuria for the past 6 weeks.  No gross hematuria, flank pain, fevers, or chills.   Portions of the above documentation were copied from a prior visit for review purposes only.  Allergies: Allergies  Allergen Reactions   Ceftin [Cefuroxime Axetil] Nausea Only    Caused sever yeast infection   Hydrocodone    Other    Sulfa Antibiotics Other (See Comments)    Unknown childhood reaction after surgery    Tramadol Other (See Comments)    Cause pain to worsen    Penicillins Rash    Has patient had a PCN reaction causing immediate rash, facial/tongue/throat swelling, SOB or lightheadedness with hypotension: Yes Has patient had a PCN  reaction causing severe rash involving mucus membranes or skin necrosis: No Has patient had a PCN reaction that required hospitalization No Has patient had a PCN reaction occurring within the last 10 years: No If all of the above answers are "NO", then may proceed with Cephalosporin use.     PMH: Past Medical History:  Diagnosis Date   Cancer ([place])    uterine cancer, had hysterectomy   Family history of colon cancer    Family history of ovarian cancer    Hypothyroidism    Temporal arteritis ([place])    Thyroid disease    Urge incontinence     PSH: Past Surgical History:  Procedure Laterality Date   ABDOMINAL HYSTERECTOMY     ovaries removed as well   APPENDECTOMY     appendix ruptured as child, still have part of appendix   ARTERY BIOPSY Right 10/19/2015   Procedure: RIGHT TEMPORAL ARTERY BIOPSY;  Surgeon: Judeth Horn, MD;  Location: [place];  Service: General;  Laterality: Right;   BREAST LUMPECTOMY WITH RADIOACTIVE SEED LOCALIZATION Left 02/28/2018   Procedure: LEFT BREAST LUMPECTOMY WITH RADIOACTIVE SEED LOCALIZATION;  Surgeon: Excell Seltzer, MD;  Location: [place];  Service: General;  Laterality: Left;   HERNIA REPAIR  33/0076   umbilical   INSERTION OF MESH N/A 03/28/2016   Procedure: INSERTION OF MESH;  Surgeon: Judeth Horn, MD;  Location: [facility] [place];  Service: General;  Laterality: N/A;   UMBILICAL HERNIA REPAIR N/A 03/28/2016   Procedure: LAPAROSCOPIC UMBILICAL HERNIA;  Surgeon: Judeth Horn, MD;  Location: [facility];  Service: General;  Laterality: N/A;    SH: Social History   Tobacco Use   Smoking status: Never   Smokeless tobacco: Never  Vaping Use   Vaping Use: Never used  Substance Use Topics   Alcohol use: No   Drug use: No    ROS: Constitutional:  Negative for fever, chills, weight loss CV: Negative for chest pain, previous MI, hypertension Respiratory:  Negative for shortness of breath, wheezing, sleep apnea, frequent  cough GI:  Negative for nausea, vomiting, bloody stool, GERD  PE: BP (!) 157/79   Pulse 80   Ht '5\' 3"'$  (1.6 m)   Wt 159 lb (72.1 kg)   BMI 28.17 kg/m  GENERAL APPEARANCE:  Well appearing, well developed, well nourished, NAD HEENT:  Atraumatic, normocephalic, oropharynx clear NECK:  Supple without lymphadenopathy or thyromegaly ABDOMEN:  Soft, non-tender, no masses EXTREMITIES:  Moves all extremities well, without clubbing, cyanosis, or edema NEUROLOGIC:  Alert and oriented x 3, normal gait, CN II-XII grossly intact MENTAL STATUS:  appropriate BACK:  Non-tender to palpation, No CVAT SKIN:  Warm, dry, and intact   Results: U/A: >30 WBCs, 3-10 RBCs, many bacteria, nitrite negative  PVR: 46 mL

## 2021-11-04 NOTE — Progress Notes (Signed)
post void residual =46mL 

## 2021-11-08 ENCOUNTER — Ambulatory Visit (HOSPITAL_COMMUNITY)
Admission: RE | Admit: 2021-11-08 | Discharge: 2021-11-08 | Disposition: A | Discharge status: Home / Self Care | Payer: Medicare Other | Source: Ambulatory Visit | Attending: Hematology and Oncology | Admitting: Hematology and Oncology

## 2021-11-08 ENCOUNTER — Telehealth: Payer: Self-pay

## 2021-11-08 DIAGNOSIS — Z1231 Encounter for screening mammogram for malignant neoplasm of breast: Secondary | ICD-10-CM | POA: Insufficient documentation

## 2021-11-08 DIAGNOSIS — Z170 Estrogen receptor positive status [ER+]: Secondary | ICD-10-CM | POA: Insufficient documentation

## 2021-11-08 DIAGNOSIS — C50212 Malignant neoplasm of upper-inner quadrant of left female breast: Secondary | ICD-10-CM | POA: Insufficient documentation

## 2021-11-08 LAB — URINE CULTURE

## 2021-11-08 NOTE — Telephone Encounter (Signed)
-----   Message from Primus Bravo, MD sent at 11/08/2021  7:43 AM EDT ----- Please notify patient to complete Cipro x 5 days for UTI.

## 2021-11-08 NOTE — Progress Notes (Signed)
Patient Care Team: Tisovec, Fransico Him, MD as PCP - General (Internal Medicine) Lorelle Gibbs, MD as Radiologist (Radiology) [name], NP as Nurse Practitioner (Obstetrics and Gynecology) Nicholas Lose, MD as Consulting Physician (Hematology and Oncology) Excell Seltzer, MD (Inactive) as Consulting Physician (General Surgery)  DIAGNOSIS: No diagnosis found.  SUMMARY OF ONCOLOGIC HISTORY: Oncology History  Malignant neoplasm of upper-inner quadrant of left breast in female, estrogen receptor positive ([place])  01/15/2018 Initial Diagnosis   Screening detected left breast calcifications 0.5 cm 9 o'clock position 7 cm from the nipple.  Biopsy revealed IDC grade 1 through 2 with low-grade DCIS   02/28/2018 Surgery   Left lumpectomy: No residual in situ or invasive cancer seen.   03/27/2018 Genetic Testing   DICER1 VUS identified on the common hereditary cancer panel.  The Hereditary Gene Panel offered by [facility] includes sequencing and/or deletion duplication testing of the following 47 genes: APC, ATM, AXIN2, BARD1, BMPR1A, BRCA1, BRCA2, BRIP1, CDH1, CDK4, CDKN2A (p14ARF), CDKN2A (p16INK4a), CHEK2, CTNNA1, DICER1, EPCAM (Deletion/duplication testing only), GREM1 (promoter region deletion/duplication testing only), KIT, MEN1, MLH1, MSH2, MSH3, MSH6, MUTYH, NBN, NF1, NHTL1, PALB2, PDGFRA, PMS2, POLD1, POLE, PTEN, RAD50, RAD51C, RAD51D, SDHB, SDHC, SDHD, SMAD4, SMARCA4. STK11, TP53, TSC1, TSC2, and VHL.  The following genes were evaluated for sequence changes only: SDHA and HOXB13 c.251G>A variant only. The report date is 03/27/2018.  UPDATE: DICER1 c.1468C>T (p.Arg490Cys) has been reclassified to Likely Benign.  The amended report date is October 05, 2020.   06/2019 -  Anti-estrogen oral therapy   Anastrozole $RemoveBefo'1mg'HhAnwnxrznK$  daily     CHIEF COMPLIANT:   INTERVAL HISTORY: Kim Krueger is a   ALLERGIES:  is allergic to ceftin [cefuroxime axetil], hydrocodone, other, sulfa  antibiotics, tramadol, and penicillins.  MEDICATIONS:  Current Outpatient Medications  Medication Sig Dispense Refill   acetaminophen (TYLENOL) 325 MG tablet Take 650 mg by mouth every 6 (six) hours as needed for moderate pain.     anastrozole (ARIMIDEX) 1 MG tablet TAKE 1 TABLET(1 MG) BY MOUTH DAILY 30 tablet 0   ciprofloxacin (CIPRO) 500 MG tablet Take 1 tablet (500 mg total) by mouth every 12 (twelve) hours for 5 days. 10 tablet 0   levothyroxine (SYNTHROID) 88 MCG tablet Take 1 tablet by mouth daily.     memantine (NAMENDA) 5 MG tablet Take 1 tablet (5 mg at night) for 2 weeks, then increase to 1 tablet (5 mg) twice a day 60 tablet 11   Vibegron (GEMTESA) 75 MG TABS Take 75 mg by mouth daily. 28 tablet 0   No current facility-administered medications for this visit.    PHYSICAL EXAMINATION: ECOG PERFORMANCE STATUS: {CHL ONC ECOG [phone]}  There were no vitals filed for this visit. There were no vitals filed for this visit.  BREAST:*** No palpable masses or nodules in either right or left breasts. No palpable axillary supraclavicular or infraclavicular adenopathy no breast tenderness or nipple discharge. (exam performed in the presence of a chaperone)  LABORATORY DATA:  I have reviewed the data as listed    Latest Ref Rng & Units 07/15/2021    4:16 AM 07/14/2021    4:38 AM 07/13/2021    7:26 PM  CMP  Glucose 70 - 99 mg/dL 93  113  142   BUN 8 - 23 mg/dL $Remove'9  10  11   'HNzwXyh$ Creatinine 0.44 - 1.00 mg/dL 0.63  0.73  0.95   Sodium 135 - 145 mmol/L 137  135  136   Potassium  3.5 - 5.1 mmol/L 3.3  3.0  2.5   Chloride 98 - 111 mmol/L 105  103  100   CO2 22 - 32 mmol/L $RemoveB'24  23  25   'oIkyggqA$ Calcium 8.9 - 10.3 mg/dL 7.9  7.9  8.3   Total Protein 6.5 - 8.1 g/dL  5.5  6.9   Total Bilirubin 0.3 - 1.2 mg/dL  0.7  1.0   Alkaline Phos 38 - 126 U/L  57  64   AST 15 - 41 U/L  13  15   ALT 0 - 44 U/L  14  17     Lab Results  Component Value Date   WBC 11.3 (H) 07/15/2021   HGB 9.2 (L) 07/15/2021    HCT 29.2 (L) 07/15/2021   MCV 97.0 07/15/2021   PLT 270 07/15/2021   NEUTROABS 12.1 (H) 07/14/2021    ASSESSMENT & PLAN:  No problem-specific Assessment & Plan notes found for this encounter.    No orders of the defined types were placed in this encounter.  The patient has a good understanding of the overall plan. she agrees with it. she will call with any problems that may develop before the next visit here. Total time spent: 30 mins including face to face time and time spent for planning, charting and co-ordination of care   Suzzette Righter, [place] 11/08/21    I Gardiner Coins am scribing for Dr. Lindi Adie  ***

## 2021-11-08 NOTE — Telephone Encounter (Signed)
Patient aware of MD's response to culture and voiced understanding.

## 2021-11-09 ENCOUNTER — Inpatient Hospital Stay
Admission: RE | Admit: 2021-11-09 | Discharge: 2021-11-09 | Disposition: A | Discharge status: Home / Self Care | Payer: Self-pay | Source: Ambulatory Visit | Attending: Hematology and Oncology | Admitting: Hematology and Oncology

## 2021-11-09 ENCOUNTER — Ambulatory Visit
Admission: RE | Admit: 2021-11-09 | Discharge: 2021-11-09 | Disposition: A | Discharge status: Home / Self Care | Payer: Self-pay | Source: Ambulatory Visit | Attending: Hematology and Oncology | Admitting: Hematology and Oncology

## 2021-11-09 ENCOUNTER — Other Ambulatory Visit: Payer: Self-pay | Admitting: Hematology and Oncology

## 2021-11-09 DIAGNOSIS — C50212 Malignant neoplasm of upper-inner quadrant of left female breast: Secondary | ICD-10-CM

## 2021-11-10 ENCOUNTER — Other Ambulatory Visit: Payer: Self-pay

## 2021-11-10 ENCOUNTER — Telehealth: Payer: Self-pay

## 2021-11-10 ENCOUNTER — Inpatient Hospital Stay: Payer: Medicare Other | Attending: Hematology and Oncology | Admitting: Hematology and Oncology

## 2021-11-10 VITALS — BP 155/71 | HR 71 | Temp 97.7°F | Resp 18 | Ht 63.0 in | Wt 155.5 lb

## 2021-11-10 DIAGNOSIS — Z780 Asymptomatic menopausal state: Secondary | ICD-10-CM | POA: Diagnosis not present

## 2021-11-10 DIAGNOSIS — Z170 Estrogen receptor positive status [ER+]: Secondary | ICD-10-CM | POA: Diagnosis not present

## 2021-11-10 DIAGNOSIS — C50212 Malignant neoplasm of upper-inner quadrant of left female breast: Secondary | ICD-10-CM | POA: Diagnosis not present

## 2021-11-10 MED ORDER — ANASTROZOLE 1 MG PO TABS: ORAL_TABLET | ORAL | 3 refills | Status: DC

## 2021-11-10 NOTE — Assessment & Plan Note (Signed)
01/15/2018:Screening detected left breast calcifications 0.5 cm 9 o'clock position 7 cm from the nipple. 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  Treatment plan:Anastrozole 1 mg daily delayed due to family issues and stress.  Started 07/11/2019 She waited to get better from temporal arteritis Her husband had an accident. Had 5 surgeries. Lots of stress with home health. Dont know if they can save his leg.  Anastrozole toxicities: Denies any adverse effects to anastrozole therapy.  Breast cancer surveillance: 1.  Breast exam 11/10/2021: Benign 2. mammogram 11/10/2021: [facility]: Benign  Return to clinic in 1 year for follow-up

## 2021-11-10 NOTE — Telephone Encounter (Signed)
Patient called asking:  Cypro - she is finished taking   -Still burning  -does she need to keep taking   [place] -  pamphlet says it causes UTI -Wants to know if she needs to keep taking if causing UTI's  Wants to know if she had a bladder infection.  Call pt back at:  [phone] / [phone]  Thanks, Helene Kelp

## 2021-11-11 ENCOUNTER — Ambulatory Visit: Payer: Medicare Other | Admitting: Physician Assistant

## 2021-11-11 VITALS — BP 149/85 | HR 78

## 2021-11-11 DIAGNOSIS — Z87440 Personal history of urinary (tract) infections: Secondary | ICD-10-CM | POA: Diagnosis not present

## 2021-11-11 DIAGNOSIS — R300 Dysuria: Secondary | ICD-10-CM | POA: Diagnosis not present

## 2021-11-11 DIAGNOSIS — R350 Frequency of micturition: Secondary | ICD-10-CM | POA: Diagnosis not present

## 2021-11-11 DIAGNOSIS — R309 Painful micturition, unspecified: Secondary | ICD-10-CM | POA: Diagnosis not present

## 2021-11-11 DIAGNOSIS — R3915 Urgency of urination: Secondary | ICD-10-CM | POA: Diagnosis not present

## 2021-11-11 DIAGNOSIS — N3081 Other cystitis with hematuria: Secondary | ICD-10-CM | POA: Diagnosis not present

## 2021-11-11 MED ORDER — DOXYCYCLINE HYCLATE 100 MG PO CAPS: 100.0000 mg | ORAL_CAPSULE | Freq: Two times a day (BID) | ORAL | 0 refills | Status: DC

## 2021-11-11 NOTE — Progress Notes (Signed)
Urine MDX culture Sent tracking [id number]

## 2021-11-11 NOTE — Telephone Encounter (Signed)
Patient scheduled with Sharee Pimple today for ua

## 2021-11-11 NOTE — Progress Notes (Signed)
Assessment: 1. History of UTI - Urinalysis, Routine w reflex microscopic    Plan: MDX cx sent. Doxy sent to pharm. Pt will resume Gemtesa and start hs coconut oil in the vagina as recommended by her oncologist. Will adjust tx if indicated by cx results. Keep current FU as scheduled.  Meds ordered this encounter  Medications   doxycycline (VIBRAMYCIN) 100 MG capsule    Sig: Take 1 capsule (100 mg total) by mouth every 12 (twelve) hours.    Dispense:  14 capsule    Refill:  0     Chief Complaint: No chief complaint on file.   HPI: Kim Krueger is a 81 y.o. female who presents for continued evaluation of recently diagnosed UTI.Mar[place] Kitchen Pt states she is 50% better, but still feels she is infected symptoms of urgency, frequency, burning.  No fever, chills, abdominal pain.  No nausea or vomiting.  She denies gross hematuria.  Urine culture results on 11/04/2021, greater than 100,000 colonies Klebsiella resistant to ampicillin.  Patient was treated with Cipro.  Trial of Gemtesa given on 11/04/2021 seems to have improved her overall urgency and frequency, but the patient is concerned that current UTI has occurred because of starting the medication as noted on the patient advisory leaflet.  Since her last visit, she was seen by her oncologist who recommended she begin using coconut oil in the vagina for atrophic changes.  She remains on anti-estrogen treatment of anastrozole daily.  UA=11-30 WBC, 3-10 RBC, few bact, nitrite neg  11/04/2021 Kim Krueger is a 81 y.o. female who presents for continued evaluation of recurrent UTIs and overactive bladder symptoms.  She was last seen by Dr. Diona Fanti in March 2023.  She has also been seen by [facility].  She has been treated with Solifenacin for her OAB symptoms with good results.  This was discontinued in June 2023 due to concerns about progressive memory loss.   She was diagnosed with a UTI in June 2023.  Urine culture grew 2000 colonies of  Klebsiella.   She presents today for evaluation of increased urinary symptoms and possible UTI.  She reports she has been treated for UTIs by her PCP in the past few months.  She was last treated with Cipro with resolution of her symptoms.  She has had her baseline symptoms of frequency, urgency, and urge incontinence.  She has noted dysuria for the past 6 weeks.  No gross hematuria, flank pain, fevers, or chills.    Portions of the above documentation were copied from a prior visit for review purposes only.  Allergies: Allergies  Allergen Reactions   Ceftin [Cefuroxime Axetil] Nausea Only    Caused sever yeast infection   Hydrocodone    Other    Sulfa Antibiotics Other (See Comments)    Unknown childhood reaction after surgery    Tramadol Other (See Comments)    Cause pain to worsen    Penicillins Rash    Has patient had a PCN reaction causing immediate rash, facial/tongue/throat swelling, SOB or lightheadedness with hypotension: Yes Has patient had a PCN reaction causing severe rash involving mucus membranes or skin necrosis: No Has patient had a PCN reaction that required hospitalization No Has patient had a PCN reaction occurring within the last 10 years: No If all of the above answers are "NO", then may proceed with Cephalosporin use.     PMH: Past Medical History:  Diagnosis Date   Cancer [facility])    uterine cancer, had hysterectomy  Family history of colon cancer    Family history of ovarian cancer    Hypothyroidism    Temporal arteritis ([place])    Thyroid disease    Urge incontinence     PSH: Past Surgical History:  Procedure Laterality Date   ABDOMINAL HYSTERECTOMY     ovaries removed as well   APPENDECTOMY     appendix ruptured as child, still have part of appendix   ARTERY BIOPSY Right 10/19/2015   Procedure: RIGHT TEMPORAL ARTERY BIOPSY;  Surgeon: Judeth Horn, MD;  Location: [facility];  Service: General;  Laterality: Right;   BREAST LUMPECTOMY  WITH RADIOACTIVE SEED LOCALIZATION Left 02/28/2018   Procedure: LEFT BREAST LUMPECTOMY WITH RADIOACTIVE SEED LOCALIZATION;  Surgeon: Excell Seltzer, MD;  Location: [facility];  Service: General;  Laterality: Left;   HERNIA REPAIR  35/5974   umbilical   INSERTION OF MESH N/A 03/28/2016   Procedure: INSERTION OF MESH;  Surgeon: Judeth Horn, MD;  Location: [facility];  Service: General;  Laterality: N/A;   UMBILICAL HERNIA REPAIR N/A 03/28/2016   Procedure: LAPAROSCOPIC UMBILICAL HERNIA;  Surgeon: Judeth Horn, MD;  Location: [facility];  Service: General;  Laterality: N/A;    SH: Social History   Tobacco Use   Smoking status: Never   Smokeless tobacco: Never  Vaping Use   Vaping Use: Never used  Substance Use Topics   Alcohol use: No   Drug use: No    ROS: All other review of systems were reviewed and are negative except what is noted above in HPI  PE: BP (!) 149/85   Pulse 78  GENERAL APPEARANCE:  Well appearing, well developed, well nourished, NAD HEENT:  Atraumatic, normocephalic NECK:  Supple. Trachea midline ABDOMEN:  Soft, non-tender, no masses EXTREMITIES:  Moves all extremities well, without clubbing, cyanosis, or edema NEUROLOGIC:  Alert and oriented x 3, normal gait, CN II-XII grossly intact MENTAL STATUS:  appropriate BACK:  Non-tender to palpation, No CVAT SKIN:  Warm, dry, and intact   Results: Laboratory Data: Lab Results  Component Value Date   WBC 11.3 (H) 07/15/2021   HGB 9.2 (L) 07/15/2021   HCT 29.2 (L) 07/15/2021   MCV 97.0 07/15/2021   PLT 270 07/15/2021    Lab Results  Component Value Date   CREATININE 0.63 07/15/2021    Urinalysis    Component Value Date/Time   COLORURINE YELLOW 07/13/2021 2020   APPEARANCEUR Cloudy (A) 11/04/2021 1417   LABSPEC 1.012 07/13/2021 2020   PHURINE 6.0 07/13/2021 2020   GLUCOSEU Negative 11/04/2021 1417   HGBUR MODERATE (A) 07/13/2021 2020   BILIRUBINUR Negative 11/04/2021 1417   KETONESUR 20 (A)  07/13/2021 2020   PROTEINUR 2+ (A) 11/04/2021 1417   PROTEINUR 30 (A) 07/13/2021 2020   NITRITE Negative 11/04/2021 1417   NITRITE NEGATIVE 07/13/2021 2020   LEUKOCYTESUR 2+ (A) 11/04/2021 1417   LEUKOCYTESUR SMALL (A) 07/13/2021 2020    Lab Results  Component Value Date   LABMICR See below: 11/04/2021   WBCUA >30 (H) 11/04/2021   LABEPIT 0-10 11/04/2021   MUCUS Present 03/30/2021   BACTERIA Many (A) 11/04/2021    Pertinent Imaging: No results found for this or any previous visit.  No results found for this or any previous visit.  No results found for this or any previous visit.  No results found for this or any previous visit.  No results found for this or any previous visit.  No valid procedures specified. No results found  for this or any previous visit.  No results found for this or any previous visit.  No results found for this or any previous visit (from the past 24 hour(s)).

## 2021-11-12 LAB — URINALYSIS, ROUTINE W REFLEX MICROSCOPIC
Bilirubin, UA: NEGATIVE
Glucose, UA: NEGATIVE
Ketones, UA: NEGATIVE
Nitrite, UA: NEGATIVE
Specific Gravity, UA: 1.025 (ref 1.005–1.030)
Urobilinogen, Ur: 0.2 mg/dL (ref 0.2–1.0)
pH, UA: 5.5 (ref 5.0–7.5)

## 2021-11-12 LAB — MICROSCOPIC EXAMINATION

## 2021-11-18 ENCOUNTER — Encounter: Payer: Self-pay | Admitting: Physician Assistant

## 2021-11-18 ENCOUNTER — Telehealth: Payer: Self-pay

## 2021-11-18 NOTE — Telephone Encounter (Signed)
Patient called requesting results from MDX culture, verbal from [place] culture is negative no further treatment needed.  Patient informed of results.  She states that she has been experiencing vertigo the last 2 days so she has not taken the gemtesa rx during that time.  She wants to know if that can be a side effect.  She is also still having burning with urination.

## 2021-11-18 NOTE — Telephone Encounter (Signed)
Patient informed and voiced understanding

## 2021-12-13 ENCOUNTER — Encounter: Payer: Self-pay | Admitting: Urology

## 2021-12-13 ENCOUNTER — Ambulatory Visit: Payer: Medicare Other | Admitting: Urology

## 2021-12-13 VITALS — BP 149/79 | HR 81 | Ht 63.0 in | Wt 155.5 lb

## 2021-12-13 DIAGNOSIS — N3281 Overactive bladder: Secondary | ICD-10-CM

## 2021-12-13 DIAGNOSIS — Z87440 Personal history of urinary (tract) infections: Secondary | ICD-10-CM

## 2021-12-13 HISTORY — DX: Overactive bladder: N32.81

## 2021-12-13 NOTE — Progress Notes (Signed)
Assessment: 1. History of UTI   2. OAB (overactive bladder)     Plan: She will bring a urine sample to the office later today or tomorrow for evaluation. We will await these results before starting any antibiotics. Discussed possible treatment with daily antibiotic prophylaxis for her frequent UTIs.  Chief Complaint: Chief Complaint  Patient presents with   Recurrent UTI    HPI: Kim Krueger is a 81 y.o. female who presents for continued evaluation of recurrent UTIs and overactive bladder symptoms.  She was previously seen by Dr. Diona Fanti in March 2023.  She has also been seen by [facility].  She has been treated with Solifenacin for her OAB symptoms with good results.  This was discontinued in June 2023 due to concerns about progressive memory loss.  She was diagnosed with a UTI in June 2023.  Urine culture grew 2000 colonies of Klebsiella.  At her visit in 10/23, she was seen for evaluation of increased urinary symptoms and possible UTI.  She reported she has been treated for UTIs by her PCP in the past few months.  She was last treated with Cipro with resolution of her symptoms.  She continued with her baseline symptoms of frequency, urgency, and urge incontinence.  She noted dysuria for the past 6 weeks.  No gross hematuria, flank pain, fevers, or chills. Urine culture from 11/04/2021 grew >100 K Klebsiella.  She was treated with Cipro. She was also given a trial of Gemtesa 75 mg daily.  She discontinued the medication due to concern of potential side effects. She was seen in the office on 11/11/2021 with continued urinary symptoms.  Resolve MDX culture showed no growth.  She was treated with doxycycline.  She reports that her symptoms improved while on the doxycycline.  She did well for approximately 2 weeks.  She had recurrence of her symptoms approximately 2 weeks ago.  She is having dysuria, frequency, and urgency.  She is taking AZO for symptom relief.   Portions  of the above documentation were copied from a prior visit for review purposes only.  Allergies: Allergies  Allergen Reactions   Ceftin [Cefuroxime Axetil] Nausea Only    Caused sever yeast infection   Hydrocodone    Other    Sulfa Antibiotics Other (See Comments)    Unknown childhood reaction after surgery    Tramadol Other (See Comments)    Cause pain to worsen    Penicillins Rash    Has patient had a PCN reaction causing immediate rash, facial/tongue/throat swelling, SOB or lightheadedness with hypotension: Yes Has patient had a PCN reaction causing severe rash involving mucus membranes or skin necrosis: No Has patient had a PCN reaction that required hospitalization No Has patient had a PCN reaction occurring within the last 10 years: No If all of the above answers are "NO", then may proceed with Cephalosporin use.     PMH: Past Medical History:  Diagnosis Date   Cancer ([place])    uterine cancer, had hysterectomy   Family history of colon cancer    Family history of ovarian cancer    Hypothyroidism    Temporal arteritis ([place])    Thyroid disease    Urge incontinence     PSH: Past Surgical History:  Procedure Laterality Date   ABDOMINAL HYSTERECTOMY     ovaries removed as well   APPENDECTOMY     appendix ruptured as child, still have part of appendix   ARTERY BIOPSY Right 10/19/2015   Procedure:  RIGHT TEMPORAL ARTERY BIOPSY;  Surgeon: Judeth Horn, MD;  Location: [place];  Service: General;  Laterality: Right;   BREAST LUMPECTOMY WITH RADIOACTIVE SEED LOCALIZATION Left 02/28/2018   Procedure: LEFT BREAST LUMPECTOMY WITH RADIOACTIVE SEED LOCALIZATION;  Surgeon: Excell Seltzer, MD;  Location: [place] [facility];  Service: General;  Laterality: Left;   HERNIA REPAIR  50/0370   umbilical   INSERTION OF MESH N/A 03/28/2016   Procedure: INSERTION OF MESH;  Surgeon: Judeth Horn, MD;  Location: [place];  Service: General;  Laterality: N/A;   UMBILICAL  HERNIA REPAIR N/A 03/28/2016   Procedure: LAPAROSCOPIC UMBILICAL HERNIA;  Surgeon: Judeth Horn, MD;  Location: [place];  Service: General;  Laterality: N/A;    SH: Social History   Tobacco Use   Smoking status: Never   Smokeless tobacco: Never  Vaping Use   Vaping Use: Never used  Substance Use Topics   Alcohol use: No   Drug use: No    ROS: Constitutional:  Negative for fever, chills, weight loss CV: Negative for chest pain, previous MI, hypertension Respiratory:  Negative for shortness of breath, wheezing, sleep apnea, frequent cough GI:  Negative for nausea, vomiting, bloody stool, GERD  PE: BP (!) 149/79   Pulse 81   Ht '5\' 3"'$  (1.6 m)   Wt 155 lb 8 oz (70.5 kg)   BMI 27.55 kg/m  GENERAL APPEARANCE:  Well appearing, well developed, well nourished, NAD HEENT:  Atraumatic, normocephalic, oropharynx clear NECK:  Supple without lymphadenopathy or thyromegaly ABDOMEN:  Soft, non-tender, no masses EXTREMITIES:  Moves all extremities well, without clubbing, cyanosis, or edema NEUROLOGIC:  Alert and oriented x 3, normal gait, CN II-XII grossly intact MENTAL STATUS:  appropriate BACK:  Non-tender to palpation, No CVAT SKIN:  Warm, dry, and intact  Results: No specimen provided.

## 2021-12-14 LAB — MICROSCOPIC EXAMINATION: WBC, UA: >30 /hpf — AB (ref 0–5)

## 2021-12-14 LAB — URINALYSIS, ROUTINE W REFLEX MICROSCOPIC
Bilirubin, UA: NEGATIVE
Nitrite, UA: POSITIVE — AB
Specific Gravity, UA: 1.01 (ref 1.005–1.030)
Urobilinogen, Ur: 1 mg/dL (ref 0.2–1.0)
pH, UA: 5 (ref 5.0–7.5)

## 2021-12-20 ENCOUNTER — Telehealth: Payer: Self-pay

## 2021-12-20 NOTE — Telephone Encounter (Signed)
Patient states that at the time of her visit on 11/27 she was unable to give a urine so she dropped off a sample later that afternoon.  She is requesting the results from that ua before her apt with obgyn.  Please advise.

## 2021-12-21 ENCOUNTER — Telehealth: Payer: Self-pay

## 2021-12-21 ENCOUNTER — Other Ambulatory Visit: Payer: Medicare Other

## 2021-12-21 ENCOUNTER — Ambulatory Visit (HOSPITAL_COMMUNITY)
Admission: RE | Admit: 2021-12-21 | Discharge: 2021-12-21 | Disposition: A | Discharge status: Home / Self Care | Payer: Medicare Other | Source: Ambulatory Visit | Attending: Hematology and Oncology | Admitting: Hematology and Oncology

## 2021-12-21 ENCOUNTER — Other Ambulatory Visit: Payer: Self-pay | Admitting: Urology

## 2021-12-21 ENCOUNTER — Other Ambulatory Visit: Payer: Self-pay

## 2021-12-21 DIAGNOSIS — Z87440 Personal history of urinary (tract) infections: Secondary | ICD-10-CM

## 2021-12-21 DIAGNOSIS — C50212 Malignant neoplasm of upper-inner quadrant of left female breast: Secondary | ICD-10-CM | POA: Insufficient documentation

## 2021-12-21 DIAGNOSIS — Z780 Asymptomatic menopausal state: Secondary | ICD-10-CM | POA: Insufficient documentation

## 2021-12-21 DIAGNOSIS — Z170 Estrogen receptor positive status [ER+]: Secondary | ICD-10-CM | POA: Insufficient documentation

## 2021-12-21 DIAGNOSIS — R8290 Unspecified abnormal findings in urine: Secondary | ICD-10-CM

## 2021-12-21 DIAGNOSIS — M8589 Other specified disorders of bone density and structure, multiple sites: Secondary | ICD-10-CM | POA: Diagnosis not present

## 2021-12-21 DIAGNOSIS — R300 Dysuria: Secondary | ICD-10-CM

## 2021-12-21 MED ORDER — DOXYCYCLINE HYCLATE 100 MG PO CAPS: 100.0000 mg | ORAL_CAPSULE | Freq: Two times a day (BID) | ORAL | 0 refills | Status: DC

## 2021-12-21 NOTE — Telephone Encounter (Signed)
Patient aware of MD response to ua and abx, she will drop off sample to send for culture.  Order in.

## 2021-12-21 NOTE — Progress Notes (Signed)
Opened in error

## 2021-12-21 NOTE — Telephone Encounter (Signed)
Attempted to call pt and offer appt to discuss results. LVM for call back.

## 2021-12-21 NOTE — Telephone Encounter (Signed)
-----   Message from Gardenia Phlegm, NP sent at 12/21/2021  1:47 PM EST ----- Please let patient know that her bone density testing is worse from prior.  I would recommend f/u visit either in person or on phone to discuss options.  Will you see if she can see me sometime this week?  ----- Message ----- From: Interface, Rad Results In Sent: 12/21/2021   1:34 PM EST To: Nicholas Lose, MD

## 2021-12-22 ENCOUNTER — Telehealth: Payer: Self-pay

## 2021-12-22 ENCOUNTER — Other Ambulatory Visit: Payer: Medicare Other

## 2021-12-22 DIAGNOSIS — Z87440 Personal history of urinary (tract) infections: Secondary | ICD-10-CM

## 2021-12-22 LAB — MICROSCOPIC EXAMINATION: WBC, UA: >30 /hpf — AB (ref 0–5)

## 2021-12-22 LAB — URINALYSIS, ROUTINE W REFLEX MICROSCOPIC
Bilirubin, UA: NEGATIVE
Glucose, UA: NEGATIVE
Ketones, UA: NEGATIVE
Nitrite, UA: NEGATIVE
Protein,UA: NEGATIVE
Specific Gravity, UA: <1.005 — ABNORMAL LOW (ref 1.005–1.030)
Urobilinogen, Ur: 0.2 mg/dL (ref 0.2–1.0)
pH, UA: 5.5 (ref 5.0–7.5)

## 2021-12-22 NOTE — Telephone Encounter (Signed)
-----   Message from Gardenia Phlegm, NP sent at 12/21/2021  1:47 PM EST ----- Please let patient know that her bone density testing is worse from prior.  I would recommend f/u visit either in person or on phone to discuss options.  Will you see if she can see me sometime this week?  ----- Message ----- From: Interface, Rad Results In Sent: 12/21/2021   1:34 PM EST To: Nicholas Lose, MD

## 2021-12-22 NOTE — Telephone Encounter (Signed)
Per NP's request, called pt to relay message from the provider (see notes below). Pt verbalized understanding. Pt is scheduled for a telephone visit on Friday, 12/24/2021 @ 2:45 pm to discuss options.

## 2021-12-23 DIAGNOSIS — Z87440 Personal history of urinary (tract) infections: Secondary | ICD-10-CM | POA: Diagnosis not present

## 2021-12-24 ENCOUNTER — Inpatient Hospital Stay: Payer: Medicare Other | Attending: Hematology and Oncology | Admitting: Adult Health

## 2021-12-24 DIAGNOSIS — M816 Localized osteoporosis [Lequesne]: Secondary | ICD-10-CM | POA: Diagnosis not present

## 2021-12-26 LAB — URINE CULTURE

## 2021-12-27 ENCOUNTER — Other Ambulatory Visit: Payer: Self-pay | Admitting: Urology

## 2021-12-27 ENCOUNTER — Telehealth: Payer: Self-pay

## 2021-12-27 DIAGNOSIS — R8290 Unspecified abnormal findings in urine: Secondary | ICD-10-CM

## 2021-12-27 DIAGNOSIS — Z87440 Personal history of urinary (tract) infections: Secondary | ICD-10-CM

## 2021-12-27 MED ORDER — METHENAMINE HIPPURATE 1 G PO TABS: 1.0000 g | ORAL_TABLET | Freq: Two times a day (BID) | ORAL | 2 refills | Status: DC

## 2021-12-27 MED ORDER — CIPROFLOXACIN HCL 500 MG PO TABS: 500.0000 mg | ORAL_TABLET | Freq: Two times a day (BID) | ORAL | 0 refills | Status: DC

## 2021-12-27 NOTE — Telephone Encounter (Signed)
-----   Message from Primus Bravo, MD sent at 12/27/2021 12:36 PM EST ----- Please notify patient to stop doxycycline and begin Cipro for treatment of UTI. I sent in a rx for methenamine for her to begin after completing Cipro for UTI prevention. Recommend follow-up in 4-6 weeks to make sure UTI has cleared.

## 2021-12-27 NOTE — Telephone Encounter (Signed)
Patient is aware of MD response to urine culture as well as rx instructions.  Patient requested to f/u with Dr. Alyson Ingles in 4-6 weeks.  Apt scheduled and reminder letter mailed.

## 2021-12-28 ENCOUNTER — Ambulatory Visit: Payer: Medicare Other | Admitting: Hematology and Oncology

## 2021-12-28 DIAGNOSIS — M810 Age-related osteoporosis without current pathological fracture: Secondary | ICD-10-CM

## 2021-12-28 HISTORY — DX: Age-related osteoporosis without current pathological fracture: M81.0

## 2021-12-28 NOTE — Progress Notes (Signed)
[facility] Follow up:    [address], [place] [address]   DIAGNOSIS:  Cancer Staging  Malignant neoplasm of upper-inner quadrant of left breast in female, estrogen receptor positive ([place]) Staging form: Breast, AJCC 8th Edition - Clinical: Stage Unknown (cT1b, cNX, cM0, G1, ER+, PR+, HER2-) - Unsigned Histologic grading system: 3 grade system Laterality: Left Tumor size (mm): 7 - Pathologic: pT0, pN0, cM0 - Unsigned  I connected with Evon Slack on 12/28/21 at  2:45 PM EST by telephone and verified that I am speaking with the correct person using two identifiers.  I discussed the limitations, risks, security and privacy concerns of performing an evaluation and management service by telephone and the availability of in person appointments.  I also discussed with the patient that there may be a patient responsible charge related to this service. The patient expressed understanding and agreed to proceed.  Patient location: home Provider location: [facility] office  SUMMARY OF ONCOLOGIC HISTORY: Oncology History  Malignant neoplasm of upper-inner quadrant of left breast in female, estrogen receptor positive ([place])  01/15/2018 Initial Diagnosis   Screening detected left breast calcifications 0.5 cm 9 o'clock position 7 cm from the nipple.  Biopsy revealed IDC grade 1 through 2 with low-grade DCIS   02/28/2018 Surgery   Left lumpectomy: No residual in situ or invasive cancer seen.   03/27/2018 Genetic Testing   DICER1 VUS identified on the common hereditary cancer panel.  The Hereditary Gene Panel offered by [facility] includes sequencing and/or deletion duplication testing of the following 47 genes: APC, ATM, AXIN2, BARD1, BMPR1A, BRCA1, BRCA2, BRIP1, CDH1, CDK4, CDKN2A (p14ARF), CDKN2A (p16INK4a), CHEK2, CTNNA1, DICER1, EPCAM (Deletion/duplication testing only), GREM1 (promoter region deletion/duplication testing only), KIT, MEN1, MLH1, MSH2,  MSH3, MSH6, MUTYH, NBN, NF1, NHTL1, PALB2, PDGFRA, PMS2, POLD1, POLE, PTEN, RAD50, RAD51C, RAD51D, SDHB, SDHC, SDHD, SMAD4, SMARCA4. STK11, TP53, TSC1, TSC2, and VHL.  The following genes were evaluated for sequence changes only: SDHA and HOXB13 c.251G>A variant only. The report date is 03/27/2018.  UPDATE: DICER1 c.1468C>T (p.Arg490Cys) has been reclassified to Likely Benign.  The amended report date is October 05, 2020.   06/2019 -  Anti-estrogen oral therapy   Anastrozole 26m daily     CURRENT THERAPY: Anastrozole  INTERVAL HISTORY: DKEYUNNA COCO81y.o. female returns for valuation of her recent bone density testing that she underwent on December 21, 2021 demonstrating osteoporosis in the left forearm and osteopenia in the left femur.  [facility] osteopenia in the left femur is -1.9 in 2021 and -2.3 in December 2023 bone density testing.  DTitiannais taking calcium, vitamin D, and performs weightbearing exercises.  Patient Active Problem List   Diagnosis Date Noted   Osteoporosis 12/28/2021   History of UTI 12/13/2021   OAB (overactive bladder) 137/34/2876  Acute metabolic encephalopathy 081/15/7262  Hypokalemia 07/14/2021   Prolonged QT interval 07/14/2021   Hypothyroidism 07/14/2021   Sepsis due to undetermined organism ([facility] 07/14/2021   History of left breast cancer 07/14/2021   UTI (urinary tract infection) 07/14/2021   Genetic testing 03/30/2018   History of uterine cancer 03/14/2018   Family history of colon cancer    Family history of ovarian cancer    Malignant neoplasm of upper-inner quadrant of left breast in female, estrogen receptor positive ([facility] 02/12/2018   Temporal arteritis ([facility] 12/17/2015    is allergic to  ceftin [cefuroxime axetil], hydrocodone, other, sulfa antibiotics, tramadol, and penicillins.  MEDICAL HISTORY: Past Medical History:  Diagnosis Date   Cancer [facility])    uterine  cancer, had hysterectomy   Family history of colon cancer    Family history of ovarian cancer    Hypothyroidism    Temporal arteritis ([place])    Thyroid disease    Urge incontinence     SURGICAL HISTORY: Past Surgical History:  Procedure Laterality Date   ABDOMINAL HYSTERECTOMY     ovaries removed as well   APPENDECTOMY     appendix ruptured as child, still have part of appendix   ARTERY BIOPSY Right 10/19/2015   Procedure: RIGHT TEMPORAL ARTERY BIOPSY;  Surgeon: Judeth Horn, MD;  Location: [facility];  Service: General;  Laterality: Right;   BREAST LUMPECTOMY WITH RADIOACTIVE SEED LOCALIZATION Left 02/28/2018   Procedure: LEFT BREAST LUMPECTOMY WITH RADIOACTIVE SEED LOCALIZATION;  Surgeon: Excell Seltzer, MD;  Location: [facility];  Service: General;  Laterality: Left;   HERNIA REPAIR  18/8416   umbilical   INSERTION OF MESH N/A 03/28/2016   Procedure: INSERTION OF MESH;  Surgeon: Judeth Horn, MD;  Location: [facility];  Service: General;  Laterality: N/A;   UMBILICAL HERNIA REPAIR N/A 03/28/2016   Procedure: LAPAROSCOPIC UMBILICAL HERNIA;  Surgeon: Judeth Horn, MD;  Location: [facility];  Service: General;  Laterality: N/A;    SOCIAL HISTORY: Social History   Socioeconomic History   Marital status: Married    Spouse name: Not on file   Number of children: 1   Years of education: 14   Highest education level: Not on file  Occupational History   Not on file  Tobacco Use   Smoking status: Never   Smokeless tobacco: Never  Vaping Use   Vaping Use: Never used  Substance and Sexual Activity   Alcohol use: No   Drug use: No   Sexual activity: Not on file  Other Topics Concern   Not on file  Social History Narrative   Right    Two story home   Social Determinants of Health   Financial Resource Strain: Not on file  Food Insecurity: Not on file  Transportation Needs: No Transportation Needs (03/14/2018)   PRAPARE - Transportation    Lack of  Transportation (Medical): No    Lack of Transportation (Non-Medical): No  Physical Activity: Not on file  Stress: Not on file  Social Connections: Not on file  Intimate Partner Violence: Not on file    FAMILY HISTORY: Family History  Problem Relation Age of Onset   Colon cancer Mother 17       d. 10   Heart disease Father    Ovarian cancer Cousin        died at 60; pat first cousin   Dementia Maternal Uncle    Dementia Maternal Grandmother    Dementia Cousin 78       mat first cousin    Review of Systems  Constitutional:  Negative for appetite change, chills, fatigue, fever and unexpected weight change.  HENT:   Negative for hearing loss, lump/mass and trouble swallowing.   Eyes:  Negative for eye problems and icterus.  Respiratory:  Negative for chest tightness, cough and shortness of breath.   Cardiovascular:  Negative for chest pain, leg swelling and palpitations.  Gastrointestinal:  Negative for abdominal distention, abdominal pain, constipation, diarrhea, nausea and vomiting.  Endocrine: Negative for hot flashes.  Genitourinary:  Negative for difficulty urinating.  Musculoskeletal:  Negative for arthralgias.  Skin:  Negative for itching and rash.  Neurological:  Negative for dizziness, extremity weakness, headaches and numbness.  Hematological:  Negative for adenopathy. Does not bruise/bleed easily.  Psychiatric/Behavioral:  Negative for depression. The patient is not nervous/anxious.       PHYSICAL EXAMINATION  Patient sounds well and is in no apparent distress mood and behavior are normal.  Speech is normal.  LABORATORY DATA:  None for this visit   ASSESSMENT and THERAPY PLAN:   Osteoporosis Harjit is a 81 year old woman with history of left-sided breast cancer that was diagnosed in December 2019 status post left lumpectomy and adjuvant antiestrogen therapy with anastrozole that began in June 2021.  Her most recent bone density testing demonstrates  osteoporosis in the forearm.  Her femurs still remain osteopenia.  I reviewed with her the 2 different image reports from 2021 and 2023.  Discussed that at the breast center when they did her bone density they did the forearm which she had not had imaged previously.  We discussed how bone density testing is measured and the difference between osteopenia and osteoporosis.  I reviewed that with the osteoporosis in her forearm that does make her more likely to have a fracture particularly if she fell and caught herself.  We discussed continuing calcium vitamin D and weightbearing exercises.  I also recommended that she consider bisphosphonate therapy.  D will consider the risks and benefits of bisphosphonate therapy like we discussed and will review her results and options with her primary care provider to receive their input.  Regardless we do want a repeat bone density testing in about 2 years and continue to recommend calcium vitamin D and weightbearing exercises.  She will call me back after she has made her decision which will be in January 2024.     All questions were answered. The patient knows to call the clinic with any problems, questions or concerns. We can certainly see the patient much sooner if necessary.  Follow up instructions:    -Return to cancer center in October 2024 for follow-up with Dr. Lindi Adie -Brief bone density in December 2025 -The patient agreed to call us back once she has her options and discuss this with her PCP at her follow-up visit with them in January.    The patient was provided an opportunity to ask questions and all were answered. The patient agreed with the plan and demonstrated an understanding of the instructions.   The patient was advised to call back or seek an in-person evaluation if the symptoms worsen or if the condition fails to improve as anticipated.   I provided 25 minutes of non face-to-face telephone visit time during this encounter, and > 50%  was spent counseling as documented under my assessment & plan.  Wilber Bihari, NP 12/28/21 11:57 AM Medical Oncology and Hematology [facility] [place], [place] [address] Tel. [phone]    Fax. [phone]

## 2021-12-28 NOTE — Assessment & Plan Note (Signed)
Kim Krueger is a 81 year old woman with history of left-sided breast cancer that was diagnosed in December 2019 status post left lumpectomy and adjuvant antiestrogen therapy with anastrozole that began in June 2021.  Her most recent bone density testing demonstrates osteoporosis in the forearm.  Her femurs still remain osteopenia.  I reviewed with her the 2 different image reports from 2021 and 2023.  Discussed that at the breast center when they did her bone density they did the forearm which she had not had imaged previously.  We discussed how bone density testing is measured and the difference between osteopenia and osteoporosis.  I reviewed that with the osteoporosis in her forearm that does make her more likely to have a fracture particularly if she fell and caught herself.  We discussed continuing calcium vitamin D and weightbearing exercises.  I also recommended that she consider bisphosphonate therapy.  D will consider the risks and benefits of bisphosphonate therapy like we discussed and will review her results and options with her primary care provider to receive their input.  Regardless we do want a repeat bone density testing in about 2 years and continue to recommend calcium vitamin D and weightbearing exercises.  She will call me back after she has made her decision which will be in January 2024.

## 2021-12-29 ENCOUNTER — Telehealth: Payer: Self-pay

## 2021-12-29 NOTE — Telephone Encounter (Signed)
[facility] called to see if we received office notes and culture results from pt's visit on 12/12.  We did have the office notes but did not have urine culture.  They will resend pt's culture results.  They also state pt was confused on the instructions of her medications Dr. Felipa Eth rx'd.  Spoke to patient and made her aware of MD instructions to complete the Cipro then he would like for her to begin the Hiprex rx.  Patient voiced understanding.  Patient also wanted to now if MD would recommend her extending her cipro rx from 7 days to 14 days of treatment due to her history of UTI's.  Question fwd to MD.

## 2021-12-30 ENCOUNTER — Other Ambulatory Visit: Payer: Self-pay | Admitting: Urology

## 2021-12-30 DIAGNOSIS — R8290 Unspecified abnormal findings in urine: Secondary | ICD-10-CM

## 2021-12-30 MED ORDER — CIPROFLOXACIN HCL 500 MG PO TABS: 500.0000 mg | ORAL_TABLET | Freq: Two times a day (BID) | ORAL | 0 refills | Status: AC

## 2021-12-30 NOTE — Telephone Encounter (Signed)
Patient voiced understanding.

## 2022-01-21 ENCOUNTER — Telehealth: Payer: Self-pay

## 2022-01-21 NOTE — Telephone Encounter (Signed)
Thayer Headings called with recent office visit and results, they were faxed over and added to the patient chart.  She requested MD review those notes.  Patient is scheduled to f/u with you- previous [place] patient.

## 2022-01-24 ENCOUNTER — Encounter: Payer: Self-pay | Admitting: Physician Assistant

## 2022-01-24 ENCOUNTER — Ambulatory Visit: Payer: Medicare Other | Admitting: Physician Assistant

## 2022-01-24 VITALS — BP 159/81 | HR 70 | Ht 63.0 in | Wt 155.8 lb

## 2022-01-24 DIAGNOSIS — R413 Other amnesia: Secondary | ICD-10-CM | POA: Diagnosis not present

## 2022-01-24 NOTE — Progress Notes (Signed)
Assessment/Plan:   Memory Impairment   Kim Krueger is a very pleasant 82 y.o. RH female  with  a history of hypertension, hyperlipidemia, recent sepsis (06/2021), history of temporal arteritis, hypothyroidism, OAB with recurring UTIs, prolonged QT interval, anemia seen today in follow up for memory loss. Patient is currently on memantine 10 mg bid.  MRI of the brain 08/13/2021, personally reviewed remarkable for moderate global parenchymal volume loss and mild chronic white matter microangiopathy. Today MMSE is 29/30, overall her cognitive status has improved from her prior visit, perhaps at that time had been exacerbated by sepsis hospitalization.  Patient is scheduled for neurocognitive testing for clarity of the diagnosis in about 2 months.  At this time, no evidence of memory decline is noted, she is able to complete her ADLs without any problems.    Follow up in 6  months. Patient is scheduled for neurocognitive testing for clarity of the diagnosis and disease progression on 04/13/2022. Continue memantine 10 mg twice daily, side effects discussed Continue to control mood as per PCP Recommend good control of cardiovascular risk factors.     Subjective:    This patient is accompanied in the office by her husband who supplements the history.  Previous records as well as any outside records available were reviewed prior to todays visit. Patient was last seen on 07/22/2021 with a MoCA on 07/2021 was 17/30    Any changes in memory since last visit? "A whole lot better". Patient has some difficulty remembering recent conversations and people names, STM worse than LTM, but not worse. "Reads a lot". Likes to read the news on the phone repeats oneself?  Occasionally Disoriented when walking into a room?  Patient denies   Leaving objects in unusual places?  "She leaves her purse everywhere "  Wandering behavior?  denies   Any personality changes since last visit? denies   Any worsening  depression?:  denies   Hallucinations or paranoia?  denies   Seizures?    denies    Any sleep changes?  Some nights she may not sleep well "because she is not very active during the day" .  Denies vivid dreams, REM behavior or sleepwalking   Sleep apnea?   denies   Any hygiene concerns?  denies   Independent of bathing and dressing?  Endorsed  Does the patient needs help with medications? Patient  is in charge   Who is in charge of the finances? Husband is in charge     Any changes in appetite?  denies . "She is drinking more water ".   Patient have trouble swallowing?  denies   Does the patient cook?  Any kitchen accidents such as leaving the stove on? Patient denies   Any headaches?  Denies  Chronic back pain  denies   Ambulates with difficulty?  She did PT and she no longer needs the walker  Recent falls or head injuries? denies     Unilateral weakness, numbness or tingling?  denies   Any tremors?  denies   Any anosmia?  Patient denies   Any incontinence of urine? She has frequent UTIs, seen urology, recently place on a chronic antibiotics, no issues over the last 6 weeks.  Any bowel dysfunction?  Constipation due to medicine     Patient lives  husband  Does the patient drive? Very short distances "to the bank, parlor, grocery store"    Initial visit 07/22/21  How long did patient have memory difficulties?  She  reports that her memory worsened  over the last  4-6  months. "Took a medicine when I was in the hospital  that may have had something to do with it" Patient lives with: Spouse who reports that her memory is worse over the last 6 months  "couldn't remember the date and time" . Daughter reports that her memory deficits are"longer than that,maybe 9 mo to  year,  writing things all the time such as conversations and appointments because she otherwise could not remember"  repeats oneself? Endorsed Disoriented when walking into a room?  Patient denies   Leaving objects in unusual  places?  Patient denies  but  husband  reports that she places her purse anywhere. " The other day she cod not remember where she left her phone we moved the house upside down and then she realized the phone was in her pocket "  Ambulates  with difficulty?   Patient denies   Recent falls?  Endorsed " I have some knee issues so  I use a walker when needed " Any head injuries?  Patient denies   History of seizures?   Patient denies   Wandering behavior?  Patient denies   Patient drives?   Patient drives very short distances " to the bank, beauty salon, grocery and drugstore" Any mood changes such irritability agitation? "She is a lot nicer than she was"- daughter says  Any history of depression?:  Patient denies   Hallucinations?  Patient denies at this moment, "only when I was in the hospital"   Paranoia?  Patient denies   Patient reports that he sleeps well without vivid dreams, REM behavior or sleepwalking   History of sleep apnea?  Patient denies   Any hygiene concerns?  Patient denies   Independent of bathing and dressing?  Endorsed  Does the patient needs help with medications?  Husband in charge Who is in charge of the finances? Husband is in charge    Any changes in appetite?  Patient denies, but " she doesn't drink enough water"  Patient have trouble swallowing? Patient denies   Does the patient cook?  Patient denies   Any kitchen accidents such as leaving the stove on? Husband does the cooking  Any headaches?  Patient denies  TA  Double vision? Patient denies   Any focal numbness or tingling?  Patient denies   Chronic back pain Patient denies   Unilateral weakness?  Patient denies   Any tremors?  Patient denies   Any history of anosmia?  Patient denies   Any incontinence of urine?  Incontinence wears pads. Frequent UTIs Any bowel dysfunction?   Patient denies    History of heavy alcohol intake?  Patient denies   History of heavy tobacco use?  Patient denies   Family history of  dementia?   Maternal Grandmother, maternal uncle and maternal niece  with Alzheimer's disease   Retired Web designer   Labs 06/2021 TSH 0.230   MRI of the brain 08/13/2021, personally reviewed remarkable for Moderate global parenchymal volume loss without discernible lobar predominance or disproportionate hippocampal atrophy, and mild chronic white matter microangiopathy.  PREVIOUS MEDICATIONS:   CURRENT MEDICATIONS:  Outpatient Encounter Medications as of 01/24/2022  Medication Sig   acetaminophen (TYLENOL) 325 MG tablet Take 650 mg by mouth every 6 (six) hours as needed for moderate pain.   anastrozole (ARIMIDEX) 1 MG tablet TAKE 1 TABLET(1 MG) BY MOUTH DAILY   levothyroxine (SYNTHROID) 88 MCG tablet Take 1 tablet by  mouth daily.   memantine (NAMENDA) 5 MG tablet Take 1 tablet (5 mg at night) for 2 weeks, then increase to 1 tablet (5 mg) twice a day   methenamine (HIPREX) 1 g tablet Take 1 tablet (1 g total) by mouth 2 (two) times daily with a meal.   [DISCONTINUED] Vibegron (GEMTESA) 75 MG TABS Take 75 mg by mouth daily.   No facility-administered encounter medications on file as of 01/24/2022.        No data to display            07/24/2021    3:00 PM  Montreal Cognitive Assessment   Visuospatial/ Executive (0/5) 0  Naming (0/3) 3  Attention: Read list of digits (0/2) 2  Attention: Read list of letters (0/1) 1  Attention: Serial 7 subtraction starting at 100 (0/3) 2  Language: Repeat phrase (0/2) 2  Language : Fluency (0/1) 0  Abstraction (0/2) 2  Delayed Recall (0/5) 0  Orientation (0/6) 5  Total 17  Adjusted Score (based on education) 17      01/24/2022   10:00 AM  MMSE - Mini Mental State Exam  Orientation to time 5  Orientation to Place 5  Registration 3  Attention/ Calculation 5  Recall 2  Language- name 2 objects 2  Language- repeat 1  Language- follow 3 step command 3  Language- read & follow direction 1  Write a sentence 1  Copy design 1   Total score 29      Objective:     PHYSICAL EXAMINATION:    VITALS:   Vitals:   01/24/22 0925  BP: (!) 159/81  Pulse: 70  SpO2: 98%  Weight: 155 lb 12.8 oz (70.7 kg)  Height: '5\' 3"'$  (1.6 m)    GEN:  The patient appears stated age and is in NAD. HEENT:  Normocephalic, atraumatic.   Neurological examination:  General: NAD, well-groomed, appears stated age. Orientation: The patient is alert. Oriented to person, place and date Cranial nerves: There is good facial symmetry.The speech is fluent and clear. No aphasia or dysarthria. Fund of knowledge is appropriate. Recent memory impaired and remote memory normal. Attention and concentration are normal.  Able to name objects and repeat phrases.  Hearing is intact to conversational tone.    Sensation: Sensation is intact to light touch throughout Motor: Strength is at least antigravity x4. DTR's 2/4 in UE/LE     Movement examination: Tone: There is normal tone in the UE/LE Abnormal movements:  no tremor.  No myoclonus.  No asterixis.   Coordination:  There is no decremation with RAM's. Normal finger to nose  Gait and Station: The patient has no difficulty arising out of a deep-seated chair without the use of the hands. The patient's stride length is good, L knee causing some difficulty due to pain  Gait is cautious and narrow.    Thank you for allowing [place] the opportunity to participate in the care of this nice patient. Please do not hesitate to contact us for any questions or concerns.   Total time spent on today's visit was 33 minutes dedicated to this patient today, preparing to see patient, examining the patient, ordering tests and/or medications and counseling the patient, documenting clinical information in the EHR or other health record, independently interpreting results and communicating results to the patient/family, discussing treatment and goals, answering patient's questions and coordinating care.  Cc:  Tisovec, Fransico Him, MD  Sharene Butters 01/24/2022 10:59 AM

## 2022-01-24 NOTE — Patient Instructions (Addendum)
.It was a pleasure to see you today at our office.   Recommendations:   Follow up in 6  months. Keep the neurocognitive testing for clarity of the diagnosis  on 04/13/2022. Continue memantine 10 mg twice daily, side effects discussed Recommend good control of cardiovascular risk factors  Whom to call:  Memory  decline, memory medications: Call our office [phone]   For psychiatric meds, mood meds: Please have your primary care physician manage these medications.   For assessment of decision of mental capacity and competency:  Call Dr. Anthoney Harada, geriatric psychiatrist at [phone]  For guidance in geriatric dementia issues please call [facility] [phone]  For guidance regarding [facility] and if placement were needed at the facility, contact Arnell Asal, Social Worker tel: [phone]  If you have any severe symptoms of a stroke, or other severe issues such as confusion,severe chills or fever, etc call 911 or go to the ER as you may need to be evaluated further   Feel free to visit Facebook page " [email or web address]" for tips of how to care for people with memory problems.      RECOMMENDATIONS FOR ALL PATIENTS WITH MEMORY PROBLEMS: 1. Continue to exercise (Recommend 30 minutes of walking everyday, or 3 hours every week) 2. Increase social interactions - continue going to [place] and enjoy social gatherings with friends and family 3. Eat healthy, avoid fried foods and eat more fruits and vegetables 4. Maintain adequate blood pressure, blood sugar, and blood cholesterol level. Reducing the risk of stroke and cardiovascular disease also helps promoting better memory. 5. Avoid stressful situations. Live a simple life and avoid aggravations. Organize your time and prepare for the next day in anticipation. 6. Sleep well, avoid any interruptions of sleep and avoid any distractions in the bedroom that may interfere with adequate sleep quality 7. Avoid  sugar, avoid sweets as there is a strong link between excessive sugar intake, diabetes, and cognitive impairment We discussed the Mediterranean diet, which has been shown to help patients reduce the risk of progressive memory disorders and reduces cardiovascular risk. This includes eating fish, eat fruits and green leafy vegetables, nuts like almonds and hazelnuts, walnuts, and also use olive oil. Avoid fast foods and fried foods as much as possible. Avoid sweets and sugar as sugar use has been linked to worsening of memory function.  There is always a concern of gradual progression of memory problems. If this is the case, then we may need to adjust level of care according to patient needs. Support, both to the patient and caregiver, should then be put into place.    FALL PRECAUTIONS: Be cautious when walking. Scan the area for obstacles that may increase the risk of trips and falls. When getting up in the mornings, sit up at the edge of the bed for a few minutes before getting out of bed. Consider elevating the bed at the head end to avoid drop of blood pressure when getting up. Walk always in a well-lit room (use night lights in the walls). Avoid area rugs or power cords from appliances in the middle of the walkways. Use a walker or a cane if necessary and consider physical therapy for balance exercise. Get your eyesight checked regularly.  FINANCIAL OVERSIGHT: Supervision, especially oversight when making financial decisions or transactions is also recommended.  HOME SAFETY: Consider the safety of the kitchen when operating appliances like stoves, microwave oven, and blender. Consider having supervision and share cooking responsibilities until  no longer able to participate in those. Accidents with firearms and other hazards in the house should be identified and addressed as well.   ABILITY TO BE LEFT ALONE: If patient is unable to contact 911 operator, consider using [facility], or when the need is  there, arrange for someone to stay with patients. Smoking is a fire hazard, consider supervision or cessation. Risk of wandering should be assessed by caregiver and if detected at any point, supervision and safe proof recommendations should be instituted.  MEDICATION SUPERVISION: Inability to self-administer medication needs to be constantly addressed. Implement a mechanism to ensure safe administration of the medications.   DRIVING: Regarding driving, in patients with progressive memory problems, driving will be impaired. We advise to have someone else do the driving if trouble finding directions or if minor accidents are reported. Independent driving assessment is available to determine safety of driving.   If you are interested in the driving assessment, you can contact the following:  [employer] in [place]  [place] [phone]  [place]  [facility] [phone] or [phone]

## 2022-02-02 ENCOUNTER — Ambulatory Visit: Payer: Medicare Other | Admitting: Urology

## 2022-02-02 ENCOUNTER — Encounter: Payer: Self-pay | Admitting: Urology

## 2022-02-02 VITALS — BP 152/61 | HR 74

## 2022-02-02 DIAGNOSIS — Z87440 Personal history of urinary (tract) infections: Secondary | ICD-10-CM

## 2022-02-02 DIAGNOSIS — N3281 Overactive bladder: Secondary | ICD-10-CM | POA: Diagnosis not present

## 2022-02-02 LAB — URINALYSIS, ROUTINE W REFLEX MICROSCOPIC
Bilirubin, UA: NEGATIVE
Glucose, UA: NEGATIVE
Ketones, UA: NEGATIVE
Nitrite, UA: NEGATIVE
Specific Gravity, UA: 1.03 (ref 1.005–1.030)
Urobilinogen, Ur: 0.2 mg/dL (ref 0.2–1.0)
pH, UA: 5.5 (ref 5.0–7.5)

## 2022-02-02 LAB — MICROSCOPIC EXAMINATION

## 2022-02-02 MED ORDER — NITROFURANTOIN MONOHYD MACRO 100 MG PO CAPS: 100.0000 mg | ORAL_CAPSULE | Freq: Two times a day (BID) | ORAL | 0 refills | Status: DC

## 2022-02-02 NOTE — Progress Notes (Signed)
post void residual=31 

## 2022-02-02 NOTE — Progress Notes (Signed)
02/02/2022 11:18 AM   Kim Krueger 1940/06/05 [id number]  Referring provider: Haywood Pao, MD [address] [place],  [place] [address]  Followup frequent UTI   HPI: Ms Kim Krueger is a 82yo here for followup for chronic cystitis and OAB. She is currently on hiprex 1g daily. UA today is concerning for infection. She has mild dysuria for the past 2 weeks. She has urinary frequency and urgency. She previously tried mirabegron which failed to improve her OAB symptoms.   PMH: Past Medical History:  Diagnosis Date   Cancer ([place])    uterine cancer, had hysterectomy   Family history of colon cancer    Family history of ovarian cancer    Hypothyroidism    Temporal arteritis ([place])    Thyroid disease    Urge incontinence     Surgical History: Past Surgical History:  Procedure Laterality Date   ABDOMINAL HYSTERECTOMY     ovaries removed as well   APPENDECTOMY     appendix ruptured as child, still have part of appendix   ARTERY BIOPSY Right 10/19/2015   Procedure: RIGHT TEMPORAL ARTERY BIOPSY;  Surgeon: Judeth Horn, MD;  Location: [facility];  Service: General;  Laterality: Right;   BREAST LUMPECTOMY WITH RADIOACTIVE SEED LOCALIZATION Left 02/28/2018   Procedure: LEFT BREAST LUMPECTOMY WITH RADIOACTIVE SEED LOCALIZATION;  Surgeon: Excell Seltzer, MD;  Location: [facility];  Service: General;  Laterality: Left;   HERNIA REPAIR  [id number]   umbilical   INSERTION OF MESH N/A 03/28/2016   Procedure: INSERTION OF MESH;  Surgeon: Judeth Horn, MD;  Location: [facility];  Service: General;  Laterality: N/A;   UMBILICAL HERNIA REPAIR N/A 03/28/2016   Procedure: LAPAROSCOPIC UMBILICAL HERNIA;  Surgeon: Judeth Horn, MD;  Location: [facility];  Service: General;  Laterality: N/A;    Home Medications:  Allergies as of 02/02/2022       Reactions   Ceftin [cefuroxime Axetil] Nausea Only   Caused sever yeast infection   Hydrocodone    Other    Sulfa Antibiotics  Other (See Comments)   Unknown childhood reaction after surgery    Tramadol Other (See Comments)   Cause pain to worsen    Penicillins Rash   Has patient had a PCN reaction causing immediate rash, facial/tongue/throat swelling, SOB or lightheadedness with hypotension: Yes Has patient had a PCN reaction causing severe rash involving mucus membranes or skin necrosis: No Has patient had a PCN reaction that required hospitalization No Has patient had a PCN reaction occurring within the last 10 years: No If all of the above answers are "NO", then may proceed with Cephalosporin use.        Medication List        Accurate as of February 02, 2022 11:18 AM. If you have any questions, ask your nurse or doctor.          acetaminophen 325 MG tablet Commonly known as: TYLENOL Take 650 mg by mouth every 6 (six) hours as needed for moderate pain.   anastrozole 1 MG tablet Commonly known as: ARIMIDEX TAKE 1 TABLET(1 MG) BY MOUTH DAILY   levothyroxine 88 MCG tablet Commonly known as: SYNTHROID Take 1 tablet by mouth daily.   memantine 5 MG tablet Commonly known as: NAMENDA Take 1 tablet (5 mg at night) for 2 weeks, then increase to 1 tablet (5 mg) twice a day   methenamine 1 g tablet Commonly known as: HIPREX Take 1 tablet (1 g total) by  mouth 2 (two) times daily with a meal.        Allergies:  Allergies  Allergen Reactions   Ceftin [Cefuroxime Axetil] Nausea Only    Caused sever yeast infection   Hydrocodone    Other    Sulfa Antibiotics Other (See Comments)    Unknown childhood reaction after surgery    Tramadol Other (See Comments)    Cause pain to worsen    Penicillins Rash    Has patient had a PCN reaction causing immediate rash, facial/tongue/throat swelling, SOB or lightheadedness with hypotension: Yes Has patient had a PCN reaction causing severe rash involving mucus membranes or skin necrosis: No Has patient had a PCN reaction that required hospitalization No Has  patient had a PCN reaction occurring within the last 10 years: No If all of the above answers are "NO", then may proceed with Cephalosporin use.     Family History: Family History  Problem Relation Age of Onset   Colon cancer Mother 52       d. 85   Heart disease Father    Ovarian cancer Cousin        died at 28; pat first cousin   Dementia Maternal Uncle    Dementia Maternal Grandmother    Dementia Cousin 27       mat first cousin    Social History:  reports that she has never smoked. She has never used smokeless tobacco. She reports that she does not drink alcohol and does not use drugs.  ROS: All other review of systems were reviewed and are negative except what is noted above in HPI  Physical Exam: BP (!) 152/61   Pulse 74   Constitutional:  Alert and oriented, No acute distress. HEENT: [place] AT, moist mucus membranes.  Trachea midline, no masses. Cardiovascular: No clubbing, cyanosis, or edema. Respiratory: Normal respiratory effort, no increased work of breathing. GI: Abdomen is soft, nontender, nondistended, no abdominal masses GU: No CVA tenderness.  Lymph: No cervical or inguinal lymphadenopathy. Skin: No rashes, bruises or suspicious lesions. Neurologic: Grossly intact, no focal deficits, moving all 4 extremities. Psychiatric: Normal mood and affect.  Laboratory Data: Lab Results  Component Value Date   WBC 11.3 (H) 07/15/2021   HGB 9.2 (L) 07/15/2021   HCT 29.2 (L) 07/15/2021   MCV 97.0 07/15/2021   PLT 270 07/15/2021    Lab Results  Component Value Date   CREATININE 0.63 07/15/2021    No results found for: "PSA"  No results found for: "TESTOSTERONE"  No results found for: "HGBA1C"  Urinalysis    Component Value Date/Time   COLORURINE YELLOW 07/13/2021 2020   APPEARANCEUR Cloudy (A) 12/22/2021 1355   LABSPEC 1.012 07/13/2021 2020   PHURINE 6.0 07/13/2021 2020   GLUCOSEU Negative 12/22/2021 1355   HGBUR MODERATE (A) 07/13/2021 2020    BILIRUBINUR Negative 12/22/2021 1355   KETONESUR 20 (A) 07/13/2021 2020   PROTEINUR Negative 12/22/2021 1355   PROTEINUR 30 (A) 07/13/2021 2020   NITRITE Negative 12/22/2021 1355   NITRITE NEGATIVE 07/13/2021 2020   LEUKOCYTESUR 2+ (A) 12/22/2021 1355   LEUKOCYTESUR SMALL (A) 07/13/2021 2020    Lab Results  Component Value Date   LABMICR See below: 12/22/2021   WBCUA >30 (A) 12/22/2021   LABEPIT 0-10 12/22/2021   MUCUS Present (A) 11/11/2021   BACTERIA Moderate (A) 12/22/2021    Pertinent Imaging:  No results found for this or any previous visit.  No results found for this or any previous visit.  No results found for this or any previous visit.  No results found for this or any previous visit.  No results found for this or any previous visit.  No valid procedures specified. No results found for this or any previous visit.  No results found for this or any previous visit.   Assessment & Plan:    1. History of UTI -macrobid '100mg'$  BID for 7 days - Urinalysis, Routine w reflex microscopic - Urine Culture  2. OAB -gemtesa '75mg'$  daily  No follow-ups on file.  Nicolette Bang, MD  [facility] [place]

## 2022-02-02 NOTE — Patient Instructions (Signed)
Urinary Tract Infection, Adult  A urinary tract infection (UTI) is an infection of any part of the urinary tract. The urinary tract includes the kidneys, ureters, bladder, and urethra. These organs make, store, and get rid of urine in the body. An upper UTI affects the ureters and kidneys. A lower UTI affects the bladder and urethra. What are the causes? Most urinary tract infections are caused by bacteria in your genital area around your urethra, where urine leaves your body. These bacteria grow and cause inflammation of your urinary tract. What increases the risk? You are more likely to develop this condition if: You have a urinary catheter that stays in place. You are not able to control when you urinate or have a bowel movement (incontinence). You are female and you: Use a spermicide or diaphragm for birth control. Have low estrogen levels. Are pregnant. You have certain genes that increase your risk. You are sexually active. You take antibiotic medicines. You have a condition that causes your flow of urine to slow down, such as: An enlarged prostate, if you are female. Blockage in your urethra. A kidney stone. A nerve condition that affects your bladder control (neurogenic bladder). Not getting enough to drink, or not urinating often. You have certain medical conditions, such as: Diabetes. A weak disease-fighting system (immunesystem). Sickle cell disease. Gout. Spinal cord injury. What are the signs or symptoms? Symptoms of this condition include: Needing to urinate right away (urgency). Frequent urination. This may include small amounts of urine each time you urinate. Pain or burning with urination. Blood in the urine. Urine that smells bad or unusual. Trouble urinating. Cloudy urine. Vaginal discharge, if you are female. Pain in the abdomen or the lower back. You may also have: Vomiting or a decreased appetite. Confusion. Irritability or tiredness. A fever or  chills. Diarrhea. The first symptom in older adults may be confusion. In some cases, they may not have any symptoms until the infection has worsened. How is this diagnosed? This condition is diagnosed based on your medical history and a physical exam. You may also have other tests, including: Urine tests. Blood tests. Tests for STIs (sexually transmitted infections). If you have had more than one UTI, a cystoscopy or imaging studies may be done to determine the cause of the infections. How is this treated? Treatment for this condition includes: Antibiotic medicine. Over-the-counter medicines to treat discomfort. Drinking enough water to stay hydrated. If you have frequent infections or have other conditions such as a kidney stone, you may need to see a health care provider who specializes in the urinary tract (urologist). In rare cases, urinary tract infections can cause sepsis. Sepsis is a life-threatening condition that occurs when the body responds to an infection. Sepsis is treated in the hospital with IV antibiotics, fluids, and other medicines. Follow these instructions at home:  Medicines Take over-the-counter and prescription medicines only as told by your health care provider. If you were prescribed an antibiotic medicine, take it as told by your health care provider. Do not stop using the antibiotic even if you start to feel better. General instructions Make sure you: Empty your bladder often and completely. Do not hold urine for long periods of time. Empty your bladder after sex. Wipe from front to back after urinating or having a bowel movement if you are female. Use each tissue only one time when you wipe. Drink enough fluid to keep your urine pale yellow. Keep all follow-up visits. This is important. Contact a health   care provider if: Your symptoms do not get better after 1-2 days. Your symptoms go away and then return. Get help right away if: You have severe pain in  your back or your lower abdomen. You have a fever or chills. You have nausea or vomiting. Summary A urinary tract infection (UTI) is an infection of any part of the urinary tract, which includes the kidneys, ureters, bladder, and urethra. Most urinary tract infections are caused by bacteria in your genital area. Treatment for this condition often includes antibiotic medicines. If you were prescribed an antibiotic medicine, take it as told by your health care provider. Do not stop using the antibiotic even if you start to feel better. Keep all follow-up visits. This is important. This information is not intended to replace advice given to you by your health care provider. Make sure you discuss any questions you have with your health care provider. Document Revised: 08/16/2019 Document Reviewed: 08/16/2019 Elsevier Patient Education  2023 Elsevier Inc.  

## 2022-03-10 ENCOUNTER — Encounter (INDEPENDENT_AMBULATORY_CARE_PROVIDER_SITE_OTHER): Payer: Self-pay | Admitting: *Deleted

## 2022-03-11 ENCOUNTER — Ambulatory Visit: Payer: Medicare Other | Admitting: Urology

## 2022-03-11 ENCOUNTER — Encounter: Payer: Self-pay | Admitting: Urology

## 2022-03-11 VITALS — BP 145/68 | HR 87

## 2022-03-11 DIAGNOSIS — N3281 Overactive bladder: Secondary | ICD-10-CM | POA: Diagnosis not present

## 2022-03-11 DIAGNOSIS — Z87440 Personal history of urinary (tract) infections: Secondary | ICD-10-CM

## 2022-03-11 LAB — MICROSCOPIC EXAMINATION: Bacteria, UA: NONE SEEN

## 2022-03-11 LAB — URINALYSIS, ROUTINE W REFLEX MICROSCOPIC
Bilirubin, UA: NEGATIVE
Glucose, UA: NEGATIVE
Ketones, UA: NEGATIVE
Leukocytes,UA: NEGATIVE
Nitrite, UA: NEGATIVE
Protein,UA: NEGATIVE
Specific Gravity, UA: 1.02 (ref 1.005–1.030)
Urobilinogen, Ur: 0.2 mg/dL (ref 0.2–1.0)
pH, UA: 6 (ref 5.0–7.5)

## 2022-03-11 MED ORDER — NITROFURANTOIN MACROCRYSTAL 50 MG PO CAPS: 50.0000 mg | ORAL_CAPSULE | Freq: Every day | ORAL | 11 refills | Status: DC

## 2022-03-11 NOTE — Progress Notes (Signed)
03/11/2022 11:44 AM   Kim Krueger 1940/01/30 [id number]  Referring provider: Haywood Pao, MD [place],  [place] [address]  Followup OAb and recurrent UTI  HPI: Kim Krueger is a 82yo here for followup for frequent UTI and OAB. Since she started macrobid her pelvic pain and LUTS have improved. She did not take the gemtesa since hers LUTS improved after finishing macrobid. She denies any frequency and urgency.    PMH: Past Medical History:  Diagnosis Date   Cancer ([place])    uterine cancer, had hysterectomy   Family history of colon cancer    Family history of ovarian cancer    Hypothyroidism    Temporal arteritis ([place])    Thyroid disease    Urge incontinence     Surgical History: Past Surgical History:  Procedure Laterality Date   ABDOMINAL HYSTERECTOMY     ovaries removed as well   APPENDECTOMY     appendix ruptured as child, still have part of appendix   ARTERY BIOPSY Right 10/19/2015   Procedure: RIGHT TEMPORAL ARTERY BIOPSY;  Surgeon: Judeth Horn, MD;  Location: [facility];  Service: General;  Laterality: Right;   BREAST LUMPECTOMY WITH RADIOACTIVE SEED LOCALIZATION Left 02/28/2018   Procedure: LEFT BREAST LUMPECTOMY WITH RADIOACTIVE SEED LOCALIZATION;  Surgeon: Excell Seltzer, MD;  Location: [facility];  Service: General;  Laterality: Left;   HERNIA REPAIR  [id number]   umbilical   INSERTION OF MESH N/A 03/28/2016   Procedure: INSERTION OF MESH;  Surgeon: Judeth Horn, MD;  Location: [facility];  Service: General;  Laterality: N/A;   UMBILICAL HERNIA REPAIR N/A 03/28/2016   Procedure: LAPAROSCOPIC UMBILICAL HERNIA;  Surgeon: Judeth Horn, MD;  Location: [facility];  Service: General;  Laterality: N/A;    Home Medications:  Allergies as of 03/11/2022       Reactions   Ceftin [cefuroxime Axetil] Nausea Only   Caused sever yeast infection   Hydrocodone    Other    Sulfa Antibiotics Other (See Comments)   Unknown  childhood reaction after surgery    Tramadol Other (See Comments)   Cause pain to worsen    Penicillins Rash   Has patient had a PCN reaction causing immediate rash, facial/tongue/throat swelling, SOB or lightheadedness with hypotension: Yes Has patient had a PCN reaction causing severe rash involving mucus membranes or skin necrosis: No Has patient had a PCN reaction that required hospitalization No Has patient had a PCN reaction occurring within the last 10 years: No If all of the above answers are "NO", then may proceed with Cephalosporin use.        Medication List        Accurate as of March 11, 2022 11:44 AM. If you have any questions, ask your nurse or doctor.          acetaminophen 325 MG tablet Commonly known as: TYLENOL Take 650 mg by mouth every 6 (six) hours as needed for moderate pain.   anastrozole 1 MG tablet Commonly known as: ARIMIDEX TAKE 1 TABLET(1 MG) BY MOUTH DAILY   levothyroxine 88 MCG tablet Commonly known as: SYNTHROID Take 1 tablet by mouth daily.   memantine 5 MG tablet Commonly known as: NAMENDA Take 1 tablet (5 mg at night) for 2 weeks, then increase to 1 tablet (5 mg) twice a day   methenamine 1 g tablet Commonly known as: HIPREX Take 1 tablet (1 g total) by mouth 2 (two) times daily with a  meal.   nitrofurantoin (macrocrystal-monohydrate) 100 MG capsule Commonly known as: MACROBID Take 1 capsule (100 mg total) by mouth every 12 (twelve) hours.        Allergies:  Allergies  Allergen Reactions   Ceftin [Cefuroxime Axetil] Nausea Only    Caused sever yeast infection   Hydrocodone    Other    Sulfa Antibiotics Other (See Comments)    Unknown childhood reaction after surgery    Tramadol Other (See Comments)    Cause pain to worsen    Penicillins Rash    Has patient had a PCN reaction causing immediate rash, facial/tongue/throat swelling, SOB or lightheadedness with hypotension: Yes Has patient had a PCN reaction causing  severe rash involving mucus membranes or skin necrosis: No Has patient had a PCN reaction that required hospitalization No Has patient had a PCN reaction occurring within the last 10 years: No If all of the above answers are "NO", then may proceed with Cephalosporin use.     Family History: Family History  Problem Relation Age of Onset   Colon cancer Mother 75       d. 56   Heart disease Father    Ovarian cancer Cousin        died at 63; pat first cousin   Dementia Maternal Uncle    Dementia Maternal Grandmother    Dementia Cousin 35       mat first cousin    Social History:  reports that she has never smoked. She has never used smokeless tobacco. She reports that she does not drink alcohol and does not use drugs.  ROS: All other review of systems were reviewed and are negative except what is noted above in HPI  Physical Exam: BP (!) 145/68   Pulse 87   Constitutional:  Alert and oriented, No acute distress. HEENT: [place] AT, moist mucus membranes.  Trachea midline, no masses. Cardiovascular: No clubbing, cyanosis, or edema. Respiratory: Normal respiratory effort, no increased work of breathing. GI: Abdomen is soft, nontender, nondistended, no abdominal masses GU: No CVA tenderness.  Lymph: No cervical or inguinal lymphadenopathy. Skin: No rashes, bruises or suspicious lesions. Neurologic: Grossly intact, no focal deficits, moving all 4 extremities. Psychiatric: Normal mood and affect.  Laboratory Data: Lab Results  Component Value Date   WBC 11.3 (H) 07/15/2021   HGB 9.2 (L) 07/15/2021   HCT 29.2 (L) 07/15/2021   MCV 97.0 07/15/2021   PLT 270 07/15/2021    Lab Results  Component Value Date   CREATININE 0.63 07/15/2021    No results found for: "PSA"  No results found for: "TESTOSTERONE"  No results found for: "HGBA1C"  Urinalysis    Component Value Date/Time   COLORURINE YELLOW 07/13/2021 2020   APPEARANCEUR Cloudy (A) 02/02/2022 1051   LABSPEC 1.012  07/13/2021 2020   PHURINE 6.0 07/13/2021 2020   GLUCOSEU Negative 02/02/2022 1051   HGBUR MODERATE (A) 07/13/2021 2020   BILIRUBINUR Negative 02/02/2022 1051   KETONESUR 20 (A) 07/13/2021 2020   PROTEINUR 2+ (A) 02/02/2022 1051   PROTEINUR 30 (A) 07/13/2021 2020   NITRITE Negative 02/02/2022 1051   NITRITE NEGATIVE 07/13/2021 2020   LEUKOCYTESUR 1+ (A) 02/02/2022 1051   LEUKOCYTESUR SMALL (A) 07/13/2021 2020    Lab Results  Component Value Date   LABMICR See below: 02/02/2022   WBCUA 11-30 (A) 02/02/2022   LABEPIT 0-10 02/02/2022   MUCUS Present (A) 11/11/2021   BACTERIA Few 02/02/2022    Pertinent Imaging:  No results found  for this or any previous visit.  No results found for this or any previous visit.  No results found for this or any previous visit.  No results found for this or any previous visit.  No results found for this or any previous visit.  No valid procedures specified. No results found for this or any previous visit.  No results found for this or any previous visit.   Assessment & Plan:    1. History of UTI -we will start macrodantin '50mg'$  qhs - Urinalysis, Routine w reflex microscopic  2. OAB (overactive bladder) -patient defers therapy at this time.    No follow-ups on file.  Nicolette Bang, MD  [facility] Urology [place]

## 2022-03-11 NOTE — Patient Instructions (Signed)
Urinary Tract Infection, Adult  A urinary tract infection (UTI) is an infection of any part of the urinary tract. The urinary tract includes the kidneys, ureters, bladder, and urethra. These organs make, store, and get rid of urine in the body. An upper UTI affects the ureters and kidneys. A lower UTI affects the bladder and urethra. What are the causes? Most urinary tract infections are caused by bacteria in your genital area around your urethra, where urine leaves your body. These bacteria grow and cause inflammation of your urinary tract. What increases the risk? You are more likely to develop this condition if: You have a urinary catheter that stays in place. You are not able to control when you urinate or have a bowel movement (incontinence). You are female and you: Use a spermicide or diaphragm for birth control. Have low estrogen levels. Are pregnant. You have certain genes that increase your risk. You are sexually active. You take antibiotic medicines. You have a condition that causes your flow of urine to slow down, such as: An enlarged prostate, if you are female. Blockage in your urethra. A kidney stone. A nerve condition that affects your bladder control (neurogenic bladder). Not getting enough to drink, or not urinating often. You have certain medical conditions, such as: Diabetes. A weak disease-fighting system (immunesystem). Sickle cell disease. Gout. Spinal cord injury. What are the signs or symptoms? Symptoms of this condition include: Needing to urinate right away (urgency). Frequent urination. This may include small amounts of urine each time you urinate. Pain or burning with urination. Blood in the urine. Urine that smells bad or unusual. Trouble urinating. Cloudy urine. Vaginal discharge, if you are female. Pain in the abdomen or the lower back. You may also have: Vomiting or a decreased appetite. Confusion. Irritability or tiredness. A fever or  chills. Diarrhea. The first symptom in older adults may be confusion. In some cases, they may not have any symptoms until the infection has worsened. How is this diagnosed? This condition is diagnosed based on your medical history and a physical exam. You may also have other tests, including: Urine tests. Blood tests. Tests for STIs (sexually transmitted infections). If you have had more than one UTI, a cystoscopy or imaging studies may be done to determine the cause of the infections. How is this treated? Treatment for this condition includes: Antibiotic medicine. Over-the-counter medicines to treat discomfort. Drinking enough water to stay hydrated. If you have frequent infections or have other conditions such as a kidney stone, you may need to see a health care provider who specializes in the urinary tract (urologist). In rare cases, urinary tract infections can cause sepsis. Sepsis is a life-threatening condition that occurs when the body responds to an infection. Sepsis is treated in the hospital with IV antibiotics, fluids, and other medicines. Follow these instructions at home:  Medicines Take over-the-counter and prescription medicines only as told by your health care provider. If you were prescribed an antibiotic medicine, take it as told by your health care provider. Do not stop using the antibiotic even if you start to feel better. General instructions Make sure you: Empty your bladder often and completely. Do not hold urine for long periods of time. Empty your bladder after sex. Wipe from front to back after urinating or having a bowel movement if you are female. Use each tissue only one time when you wipe. Drink enough fluid to keep your urine pale yellow. Keep all follow-up visits. This is important. Contact a health   care provider if: Your symptoms do not get better after 1-2 days. Your symptoms go away and then return. Get help right away if: You have severe pain in  your back or your lower abdomen. You have a fever or chills. You have nausea or vomiting. Summary A urinary tract infection (UTI) is an infection of any part of the urinary tract, which includes the kidneys, ureters, bladder, and urethra. Most urinary tract infections are caused by bacteria in your genital area. Treatment for this condition often includes antibiotic medicines. If you were prescribed an antibiotic medicine, take it as told by your health care provider. Do not stop using the antibiotic even if you start to feel better. Keep all follow-up visits. This is important. This information is not intended to replace advice given to you by your health care provider. Make sure you discuss any questions you have with your health care provider. Document Revised: 08/16/2019 Document Reviewed: 08/16/2019 Elsevier Patient Education  2023 Elsevier Inc.  

## 2022-04-12 ENCOUNTER — Encounter: Payer: Self-pay | Admitting: Psychology

## 2022-04-12 ENCOUNTER — Other Ambulatory Visit: Payer: Self-pay | Admitting: Hematology and Oncology

## 2022-04-13 ENCOUNTER — Ambulatory Visit: Payer: Medicare Other | Admitting: Psychology

## 2022-04-13 ENCOUNTER — Encounter: Payer: Self-pay | Admitting: Psychology

## 2022-04-13 DIAGNOSIS — G3184 Mild cognitive impairment, so stated: Secondary | ICD-10-CM

## 2022-04-13 DIAGNOSIS — G9341 Metabolic encephalopathy: Secondary | ICD-10-CM | POA: Diagnosis not present

## 2022-04-13 DIAGNOSIS — R4189 Other symptoms and signs involving cognitive functions and awareness: Secondary | ICD-10-CM

## 2022-04-13 HISTORY — DX: Mild cognitive impairment of uncertain or unknown etiology: G31.84

## 2022-04-13 NOTE — Progress Notes (Unsigned)
NEUROPSYCHOLOGICAL EVALUATION Kim Krueger. [facility] Department of Neurology  Date of Evaluation: April 13, 2022  Reason for Referral:   Kim Krueger is a 82 y.o. right-handed Caucasian female referred by Sharene Butters, PA-C, to characterize her current cognitive functioning and assist with diagnostic clarity and treatment planning in the context of subjective cognitive decline.   Assessment and Plan:   Clinical Impression(s): Kim Krueger pattern of performance is suggestive of prominent impairment surrounding delayed retrieval and recognition/consolidation aspects of learning and memory. Isolated deficits were also noted across response inhibition and a line orientation task. However, all other performances assessing executive functioning and visuospatial abilities were appropriate respectively. Performances were also appropriate relative to age-matched peers across processing speed, attention/concentration, safety/judgment, and both receptive and expressive language. Outside of the immediate aftermath of her June 2023 hospitalization for sepsis secondary to a UTI, Kim Krueger denied any functional concerns. As such, given evidence for cognitive dysfunction described above, she meets criteria for a Mild Neurocognitive Disorder ("mild cognitive impairment") at the present time.  The etiology for ongoing memory impairment is unclear. ***.   Recommendations: ***  A repeat neuropsychological evaluation in 12-18 months (or sooner if functional decline is noted) is recommended to assess the trajectory of future cognitive decline should it occur. This will also aid in future efforts towards improved diagnostic clarity.  Kim Krueger has already been prescribed a medication aimed to address memory loss and concerns surrounding Alzheimer's disease (i.e., ***). she is encouraged to continue taking this medication as prescribed. It is important to highlight that this  medication has been shown to slow functional decline in some individuals. There is no current treatment which can stop or reverse cognitive decline when caused by a neurodegenerative illness.   A combination of medication and psychotherapy has been shown to be most effective at treating symptoms of anxiety and depression. As such, Kim Krueger is encouraged to speak with her prescribing physician regarding medication adjustments to optimally manage these symptoms. Likewise, Kim Krueger is encouraged to consider engaging in short-term psychotherapy to address symptoms of psychiatric distress. she would benefit from an active and collaborative therapeutic environment, rather than one purely supportive in nature. Recommended treatment modalities include Cognitive Behavioral Therapy (CBT) or Acceptance and Commitment Therapy (ACT).  Performance across neurocognitive testing is not a strong predictor of an individual's safety operating a motor vehicle. Should her family wish to pursue a formalized driving evaluation, they could reach out to the following agencies: [facility] [employer] in [place]: [phone] Driver Rehabilitative Services: [facility]: [place]: [phone] or [phone]  Should there be progression of current deficits over time, Kim Krueger is unlikely to regain any independent living skills lost. Therefore, it is recommended that she remain as involved as possible in all aspects of household chores, finances, and medication management, with supervision to ensure adequate performance. she will likely benefit from the establishment and maintenance of a routine in order to maximize her functional abilities over time.  It will be important for Kim Krueger to have another person with her when in situations where she may need to process information, weigh the pros and cons of different options, and make decisions, in order to ensure  that she fully understands and recalls all information to be considered.  If not already done, Kim Krueger and her family may want to discuss her wishes regarding durable power of attorney and medical decision making, so that she can have input into  these choices. If they require legal assistance with this, long-term care resource access, or other aspects of estate planning, they could reach out to [employer] [facility] at [phone] for a free consultation. Additionally, they may wish to discuss future plans for caretaking and seek out community options for in home/residential care should they become necessary.  Kim Krueger is encouraged to attend to lifestyle factors for brain health (e.g., regular physical exercise, good nutrition habits and consideration of the MIND-DASH diet, regular participation in cognitively-stimulating activities, and general stress management techniques), which are likely to have benefits for both emotional adjustment and cognition. In fact, in addition to promoting good general health, regular exercise incorporating aerobic activities (e.g., brisk walking, jogging, cycling, etc.) has been demonstrated to be a very effective treatment for depression and stress, with similar efficacy rates to both antidepressant medication and psychotherapy.   Optimal control of vascular risk factors (including safe cardiovascular exercise and adherence to dietary recommendations) is encouraged. Likewise, continued compliance with her CPAP machine will also be important. ***  Continued participation in activities which provide mental stimulation and social interaction is also recommended.   If interested, there are some activities which have therapeutic value and can be useful in keeping her cognitively stimulated. For suggestions, Kim Krueger is encouraged to go to the following website:  https://www.[facility]row[facility].org/get-to-know-barrow/centers-programs/neurorehabilitation-center/neuro-rehab-apps-and-games/ which has options, categorized by level of difficulty. It should be noted that these activities should not be viewed as a substitute for therapy.  Important information should be provided to Kim Krueger in written format in all instances. This information should be placed in a highly frequented and easily visible location within her home to promote recall. External strategies such as written notes in a consistently used memory journal, visual and nonverbal auditory cues such as a calendar on the refrigerator or appointments with alarm, such as on a cell phone, can also help maximize recall.  Memory can be improved using internal strategies such as rehearsal, repetition, chunking, mnemonics, association, and imagery. External strategies such as written notes in a consistently used memory journal, visual and nonverbal auditory cues such as a calendar on the refrigerator or appointments with alarm, such as on a cell phone, can also help maximize recall.    When learning new information, she would benefit from information being broken up into small, manageable pieces. she may also find it helpful to articulate the material in her own words and in a context to promote encoding at the onset of a new task. This material may need to be repeated multiple times to promote encoding.  Because she shows better recall for structured information, she will likely understand and retain new information better if it is presented to her in a meaningful or well-organized manner at the outset, such as grouping items into meaningful categories or presenting information in an outlined, bulleted, or story format.  To address problems with processing speed, she may wish to consider:   -Ensuring that she is alerted when essential material or instructions are being presented   -Adjusting the speed at which new  information is presented   -Allowing for more time in comprehending, processing, and responding in conversation   -Repeating and paraphrasing instructions or conversations aloud  To address problems with fluctuating attention and/or executive dysfunction, she may wish to consider:   -Avoiding external distractions when needing to concentrate   -Limiting exposure to fast paced environments with multiple sensory demands   -Writing down complicated information and using checklists   -Attempting and  completing one task at a time (i.e., no multi-tasking)   -Verbalizing aloud each step of a task to maintain focus   -Taking frequent breaks during the completion of steps/tasks to avoid fatigue   -Reducing the amount of information considered at one time   -Scheduling more difficult activities for a time of day where she is usually most alert  {[id number]}  Review of Records:   Kim Krueger was seen by [facility] Marland KitchenEllouise Newer, M.D.) on 10/05/2015 due to symptoms of temporal arteritis. She was followed and treated by Dr. Delice Lesch over the next few years, with her last appointment dated 03/21/2017. Throughout these appointments, no concerns were raised by either Dr. Delice Lesch or Kim Krueger and her husband/family.  She was seen in the ED on 07/13/2021 with symptoms of generalized weakness, poor oral intake, and fever. Confusion was noted at the time of her admission and she reportedly had a 102 degree fever. She was ultimately determined to have sepsis and acute metabolic encephalopathy secondary to an underlying UTI. She responded well to treatment and was discharged home on 07/15/2021.   She was seen by [facility] Neurology Sharene Butters, PA-C) on 07/22/2021 for an evaluation of memory loss. At that time, memory dysfunction was at least in part related to her recent hospitalization. However, her husband had expressed some concerns surrounding mildly diminished memory abilities for about six  months prior. Examples included not remembering the date and time, as well as trouble recalling details of recent conversations. She was also noted to misplace objects around her environment. Her husband was reportedly in charge of ADLs at that time. Performance on a brief cognitive screening instrument (MOCA) was 17/30. Ultimately, Kim Krueger was referred for a comprehensive neuropsychological evaluation to characterize her cognitive abilities and to assist with diagnostic clarity and treatment planning.   Ms. Prechtl most recently met with Ms. Wertman on 01/24/2022 for follow-up. At that time, Kim Krueger reported ongoing improvement in her memory and other cognitive abilities as she has become more removed from her hospitalization. Her husband reported similar concerns as previously stated. Performance on a different cognitive screening task (MMSE) was 29/30 at that time.   Brain MRI on 08/13/2021 revealed moderate generalized parenchymal volume loss, as well as mild microvascular ischemic disease.   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  The Hereditary Gene Panel offered by [facility] includes sequencing and/or deletion duplication testing of the following 47 genes: APC, ATM, AXIN2, BARD1, BMPR1A, BRCA1, BRCA2, BRIP1, CDH1, CDK4, CDKN2A (p14ARF), CDKN2A (p16INK4a), CHEK2, CTNNA1, DICER1, EPCAM (Deletion/duplication testing only), GREM1 (promoter region deletion/duplication   History of uterine cancer 03/14/2018   s/p hysterectomy   Hypokalemia 07/14/2021   Hypothyroidism    Impingement syndrome of right shoulder region 03/13/2021   Malignant neoplasm of upper-inner quadrant of left breast in female, estrogen receptor positive 02/12/2018   Metatarsalgia of right foot 03/13/2021   OAB  (overactive bladder) 12/13/2021   Osteoarthritis of left knee 03/16/2018   Osteoporosis 12/28/2021   Pain in joint of right shoulder 03/13/2021   Pain in left knee 12/13/2017   Prolonged QT interval 07/14/2021   Sepsis due to undetermined organism 07/14/2021   Temporal arteritis    Urge incontinence    UTI (urinary tract infection) 07/14/2021    Past Surgical  History:  Procedure Laterality Date   ABDOMINAL HYSTERECTOMY     ovaries removed as well   APPENDECTOMY     appendix ruptured as child, still have part of appendix   ARTERY BIOPSY Right 10/19/2015   Procedure: RIGHT TEMPORAL ARTERY BIOPSY;  Surgeon: Judeth Horn, MD;  Location: [place];  Service: General;  Laterality: Right;   BREAST LUMPECTOMY WITH RADIOACTIVE SEED LOCALIZATION Left 02/28/2018   Procedure: LEFT BREAST LUMPECTOMY WITH RADIOACTIVE SEED LOCALIZATION;  Surgeon: Excell Seltzer, MD;  Location: [place];  Service: General;  Laterality: Left;   HERNIA REPAIR  [id number]   umbilical   INSERTION OF MESH N/A 03/28/2016   Procedure: INSERTION OF MESH;  Surgeon: Judeth Horn, MD;  Location: [place];  Service: General;  Laterality: N/A;   UMBILICAL HERNIA REPAIR N/A 03/28/2016   Procedure: LAPAROSCOPIC UMBILICAL HERNIA;  Surgeon: Judeth Horn, MD;  Location: [place];  Service: General;  Laterality: N/A;    Current Outpatient Medications:    acetaminophen (TYLENOL) 325 MG tablet, Take 650 mg by mouth every 6 (six) hours as needed for moderate pain., Disp: , Rfl:    anastrozole (ARIMIDEX) 1 MG tablet, TAKE 1 TABLET(1 MG) BY MOUTH DAILY, Disp: 90 tablet, Rfl: 3   levothyroxine (SYNTHROID) 88 MCG tablet, Take 1 tablet by mouth daily., Disp: , Rfl:    memantine (NAMENDA) 5 MG tablet, Take 1 tablet (5 mg at night) for 2 weeks, then increase to 1 tablet (5 mg) twice a day, Disp: 60 tablet, Rfl: 11   nitrofurantoin (MACRODANTIN) 50 MG capsule, Take 1 capsule (50 mg total) by mouth at bedtime., Disp: 30  capsule, Rfl: 11   nitrofurantoin, macrocrystal-monohydrate, (MACROBID) 100 MG capsule, Take 1 capsule (100 mg total) by mouth every 12 (twelve) hours., Disp: 14 capsule, Rfl: 0  Clinical Interview:   The following information was obtained during a clinical interview with Kim Krueger prior to cognitive testing.  Cognitive Symptoms: Decreased short-term memory: Denied. Kim Krueger reported "no big issues" with memory functioning and reported ongoing improvement relative to her June 2023 hospitalization. Her husband reported a greater history of mild memory dysfunction, predating her hospitalization by about six months. Examples included her being more repetitive in conversation and misplacing objects in her environment. The latter has become such an issue that he has placed a tracking device in her purse due to her misplacing it so regularly.   Decreased long-term memory: Denied. Decreased attention/concentration: Denied. Reduced processing speed: Denied. Difficulties with executive functions: Denied. Her husband noted that she does seem to have become somewhat less organized over time. However, he also wondered if this was more her not caring as strongly as she has in the past surrounding things being place din their proper location. They denied trouble with impulsivity or any significant personality changes.  Difficulties with emotion regulation: Denied. Difficulties with receptive language: Denied. Difficulties with word finding: Denied. Decreased visuoperceptual ability: Denied.  Difficulties completing ADLs: Generally denied. Her husband had become more involved in medication management and financial management during the past year since her June 2023 hospitalization. However, prior to this, she was performing these issues without difficulty and has since begun to take over some responsibilities over time. No driving related concerns were noted.   Additional Medical History: History of  traumatic brain injury/concussion: Denied. History of stroke: Denied. History of seizure activity: Denied. History of known exposure to toxins: Denied. Symptoms of chronic pain: Endorsed. She reported ongoing knee pain and that  she may be close to the point where a knee replacement procedure is warranted. She did appear somewhat apprehensive about undergoing this procedure.  Experience of frequent headaches/migraines: Denied. Frequent instances of dizziness/vertigo: Denied.  Sensory changes: She wears glasses with benefit. Other sensory changes/difficulties (i.e., hearing, taste, smell) were denied.  Balance/coordination difficulties: Denied. She did report some balance instability around the time of her hospitalization and benefited from outpatient physical therapy after her discharge. Her husband described mild instability presently, attributed to ongoing knee pain.  Other motor difficulties: Denied.  Sleep History: Estimated hours obtained each night: 8 hours.  Difficulties falling asleep: Denied. Difficulties staying asleep: Denied outside of instances where she will wake to use the restroom.  Feels rested and refreshed upon awakening: Endorsed.  History of snoring: Denied. History of waking up gasping for air: Denied. Witnessed breath cessation while asleep: Denied.  History of vivid dreaming: Denied. Excessive movement while asleep: Denied. Instances of acting out her dreams: Denied.  Psychiatric/Behavioral Health History: Depression: She described her mood as "good" and denied to her knowledge any previous mental health concerns or formal diagnoses. Current or remote suicidal ideation, intent, or plan was denied.  Anxiety: Denied. Mania: Denied. Trauma History: Denied. Visual/auditory hallucinations: Denied. Delusional thoughts: Denied.  Tobacco: Denied. Alcohol: She denied current alcohol consumption as well as a history of problematic alcohol abuse.  Recreational drugs:  Denied.  Family History: Family History  Problem Relation Age of Onset   Colon cancer Mother 57       d. 54   Heart disease Father    Dementia Maternal Grandmother    Alzheimer's disease Maternal Grandmother    Dementia Maternal Uncle    Alzheimer's disease Maternal Uncle    Ovarian cancer Cousin        died at 16; pat first cousin   Alzheimer's disease Cousin    Dementia Cousin 35       mat first cousin   This information was confirmed by Ms. Karis.  Academic/Vocational History: Highest level of educational attainment: 14 years. She graduated from high school and completed two additional years of college. She described herself as a good (A/B) student in academic settings. No relative weaknesses were described.  History of developmental delay: Denied. History of grade repetition: Denied. Enrollment in special education courses: Denied. History of LD/ADHD: Denied.  Employment: Retired. 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   Evaluation Results:   Behavioral Observations: Ms. Gorzynski was accompanied by her husband, arrived to her appointment on time, and was appropriately dressed and groomed. She appeared alert and oriented. Observed gait and station were mildly slowed due to ongoing knee pain. Slight instability was noted and she walked near the wall in order to easily brace herself if the need arose. Gross motor functioning appeared intact upon informal observation and no abnormal movements (e.g., tremors) were noted. Her affect was generally relaxed and positive. Spontaneous speech was fluent and word finding difficulties were not observed during the clinical interview. Thought processes were coherent, organized, and normal in content. Insight into her cognitive difficulties appeared poor in that she denied all memory concerns during interview while objective testing revealed fairly noteworthy impairments.   During  testing, sustained attention was appropriate. Task engagement was adequate and she persisted when challenged. Overall, Ms. Melillo was cooperative with the clinical interview and subsequent testing procedures.   Adequacy of Effort: The validity of neuropsychological testing is limited by the extent to which  the individual being tested may be assumed to have exerted adequate effort during testing. Ms. Weisner expressed her intention to perform to the best of her abilities and exhibited adequate task engagement and persistence. Scores across stand-alone and embedded performance validity measures were variable but largely within expectation. Her sole below expectation performance is believed to be due to true memory impairment rather than poor engagement or attempts to perform poorly. As such, the results of the current evaluation are believed to be a valid representation of Ms. Vallance's current cognitive functioning.  Test Results: Ms. Trachtman was largely oriented at the time of the current evaluation. She was four days off when stating the current date.   Intellectual abilities based upon educational and vocational attainment were estimated to be in the average range. Premorbid abilities were estimated to be within the well above average range based upon a single-word reading test.   Processing speed was variable but overall appropriate, ranging from the below average to well above average normative ranges. Basic attention was exceptionally high. More complex attention (e.g., working memory) was average to well above average. Response inhibition was variable, ranging from the exceptionally low to average normative ranges. Other aspects of executive functioning were average. She also performed in the above average range across a task assessing safety and judgment.   Assessed receptive language abilities were above average. Likewise, Ms. Roadcap did not exhibit any difficulties comprehending task  instructions and answered all questions asked of her appropriately. Assessed expressive language (e.g., verbal fluency and confrontation naming) was mildly variable but overall appropriate, ranging from the below average to above average normative ranges.     Assessed visuospatial/visuoconstructional abilities were average outside of a lower performance across a line orientation task.    Learning (i.e., encoding) of novel verbal information was average. Spontaneous delayed recall (i.e., retrieval) of previously learned information was exceptionally low. Retention rates were 0% across a story learning task, 0% across a list learning task, and 12% across a figure drawing task. Performance across recognition tasks was average across a story-based task but exceptionally low to well below average across two other tasks, suggesting fairly limited evidence for information consolidation.   Results of emotional screening instruments suggested that recent symptoms of generalized anxiety were in the minimal range, while symptoms of depression were within normal limits. A screening instrument assessing recent sleep quality suggested the presence of minimal sleep dysfunction.  Tables of Scores:   Note: This summary of test scores accompanies the interpretive report and should not be considered in isolation without reference to the appropriate sections in the text. Descriptors are based on appropriate normative data and may be adjusted based on clinical judgment. Terms such as "Within Normal Limits" and "Outside Normal Limits" are used when a more specific description of the test score cannot be determined.       Percentile - Normative Descriptor > 98 - Exceptionally High 91-97 - Well Above Average 75-90 - Above Average 25-74 - Average 9-24 - Below Average 2-8 - Well Below Average < 2 - Exceptionally Low       Validity:   DESCRIPTOR       Dot Counting Test: --- --- Within Normal Limits  RBANS Effort Index:  --- --- Outside Normal Limits  WAIS-IV Reliable Digit Span: --- --- Within Normal Limits       Orientation:      Raw Score Percentile   NAB Orientation, Form 1 27/29 --- ---       Cognitive  Screening:      Raw Score Percentile   SLUMS: 24/30 --- ---       RBANS, Form A: Standard Score/ Scaled Score Percentile   Total Score 78 7 Well Below Average  Immediate Memory 94 34 Average    List Learning 9 37 Average    Story Memory 9 37 Average  Visuospatial/Constructional 84 14 Below Average    Figure Copy 9 37 Average    Line Orientation 12/20 3-9 Well Below Average  Language 90 25 Average    Picture Naming 10/10 51-75 Average    Semantic Fluency 6 9 Below Average  Attention 106 66 Average    Digit Span 16 98 Exceptionally High    Coding 6 9 Below Average  Delayed Memory 40 <1 Exceptionally Low    List Recall 0/10 <2 Exceptionally Low    List Recognition 11/20 <2 Exceptionally Low    Story Recall 1 <1 Exceptionally Low    Story Recognition 10/12 27-46 Average    Figure Recall 3 1 Exceptionally Low    Figure Recognition 3/8 4-8 Well Below Average        Intellectual Functioning:      Standard Score Percentile   Test of Premorbid Functioning: 122 93 Well Above Average       Attention/Executive Function:     Trail Making Test (TMT): Raw Score (T Score) Percentile     Part A 39 secs.,  0 errors (45) 31 Average    Part B 95 secs.,  1 error (49) 46 Average         Scaled Score Percentile   WAIS-IV Digit Span: 14 91 Well Above Average    Forward 17 99 Exceptionally High    Backward 14 91 Well Above Average    Sequencing 10 50 Average        Scaled Score Percentile   WAIS-IV Similarities: 8 25 Average       D-KEFS Color-Word Interference Test: Raw Score (Scaled Score) Percentile     Color Naming 30 secs. (11) 63 Average    Word Reading 17 secs. (14) 91 Well Above Average    Inhibition 74 secs. (10) 50 Average      Total Errors 0 errors (13) 84 Above Average     Inhibition/Switching 180 secs. (1) <1 Exceptionally Low      Total Errors 10 errors (3) 1 Exceptionally Low       NAB Executive Functions Module, Form 1: T Score Percentile     Judgment 57 75 Above Average       Language:     Verbal Fluency Test: Raw Score (Scaled Score) Percentile     Phonemic Fluency (CFL) 35 (10) 50 Average    Category Fluency 34 (9) 37 Average  *Based on [employer] Older Normative Studies (MOANS)          NAB Language Module, Form 1: T Score Percentile     Auditory Comprehension 58 79 Above Average    Naming 30/31 (60) 84 Above Average       Visuospatial/Visuoconstruction:      Raw Score Percentile   Clock Drawing: 9/10 --- Within Normal Limits        Scaled Score Percentile   WAIS-IV Block Design: 10 50 Average       Mood and Personality:      Raw Score Percentile   Geriatric Depression Scale: 1 --- Within Normal Limits  Geriatric Anxiety Scale: 2 --- Minimal    Somatic 2 ---  Minimal    Cognitive 0 --- Minimal    Affective 0 --- Minimal       Additional Questionnaires:      Raw Score Percentile   PROMIS Sleep Disturbance Questionnaire: 16 --- None to Slight   Informed Consent and Coding/Compliance:   The current evaluation represents a clinical evaluation for the purposes previously outlined by the referral source and is in no way reflective of a forensic evaluation.   Ms. Bille was provided with a verbal description of the nature and purpose of the present neuropsychological evaluation. Also reviewed were the foreseeable risks and/or discomforts and benefits of the procedure, limits of confidentiality, and mandatory reporting requirements of this provider. The patient was given the opportunity to ask questions and receive answers about the evaluation. Oral consent to participate was provided by the patient.   This evaluation was conducted by Christia Reading, Ph.D., ABPP-CN, board certified clinical neuropsychologist. Ms. Schabel completed a clinical  interview with Dr. Melvyn Novas, billed as one unit [phone], and 130 minutes of cognitive testing and scoring, billed as one unit [phone] and three additional units [id number]. Psychometrist Milana Kidney, B.S., assisted Dr. Melvyn Novas with test administration and scoring procedures. As a separate and discrete service, Dr. Melvyn Novas spent a total of 160 minutes in interpretation and report writing billed as one unit [phone] and two units 96133.

## 2022-04-13 NOTE — Progress Notes (Signed)
   Psychometrician Note   Cognitive testing was administered to [employer] by Milana Kidney, B.S. (psychometrist) under the supervision of Dr. Christia Reading, Ph.D., licensed psychologist on 04/13/2022. Ms. Pirraglia did not appear overtly distressed by the testing session per behavioral observation or responses across self-report questionnaires. Rest breaks were offered.    The battery of tests administered was selected by Dr. Christia Reading, Ph.D. with consideration to Ms. Pawloski's current level of functioning, the nature of her symptoms, emotional and behavioral responses during interview, level of literacy, observed level of motivation/effort, and the nature of the referral question. This battery was communicated to the psychometrist. Communication between Dr. Christia Reading, Ph.D. and the psychometrist was ongoing throughout the evaluation and Dr. Christia Reading, Ph.D. was immediately accessible at all times. Dr. Christia Reading, Ph.D. provided supervision to the psychometrist on the date of this service to the extent necessary to assure the quality of all services provided.    LASTACIA SLISZ will return within approximately 1-2 weeks for an interactive feedback session with Dr. Melvyn Novas at which time her test performances, clinical impressions, and treatment recommendations will be reviewed in detail. Ms. Schmidbauer understands she can contact our office should she require our assistance before this time.  A total of 130 minutes of billable time were spent face-to-face with Ms. Gair by the psychometrist. This includes both test administration and scoring time. Billing for these services is reflected in the clinical report generated by Dr. Christia Reading, Ph.D.  This note reflects time spent with the psychometrician and does not include test scores or any clinical interpretations made by Dr. Melvyn Novas. The full report will follow in a separate note.

## 2022-04-14 ENCOUNTER — Encounter: Payer: Self-pay | Admitting: Psychology

## 2022-04-20 ENCOUNTER — Ambulatory Visit: Payer: Medicare Other | Admitting: Psychology

## 2022-04-20 DIAGNOSIS — G3184 Mild cognitive impairment, so stated: Secondary | ICD-10-CM | POA: Diagnosis not present

## 2022-04-20 DIAGNOSIS — G9341 Metabolic encephalopathy: Secondary | ICD-10-CM | POA: Diagnosis not present

## 2022-04-20 NOTE — Progress Notes (Signed)
   Neuropsychology Feedback Session Kim Krueger. [place] Department of Neurology  Reason for Referral:   Kim Krueger is a 82 y.o. right-handed Caucasian female referred by Sharene Butters, PA-C, to characterize her current cognitive functioning and assist with diagnostic clarity and treatment planning in the context of subjective cognitive decline.   Feedback:   Kim Krueger completed a comprehensive neuropsychological evaluation on 04/13/2022. Please refer to that encounter for the full report and recommendations. Briefly, results suggested prominent impairment surrounding delayed retrieval and recognition/consolidation aspects of learning and memory. Isolated deficits were also noted across response inhibition and a line orientation task. However, all other performances assessing executive functioning and visuospatial abilities respectively were appropriate. The etiology for ongoing memory impairment is unclear. Across memory testing, Kim Krueger was able to learn novel information reasonably well. However, after a brief delay, she was fully amnestic (i.e., 0% retention) across both verbal memory tasks and her retention was only 12% across a single visual memory task. While she did benefit from cueing across story content, performance across recognition trials for list and figure memory tasks were quite poor. Taken together, this would suggest rapid forgetting, as well as concerns for an evolving and already fairly significant storage impairment. This pattern of memory loss can be characteristic of Alzheimer's disease and while I am unable to rule in this condition presently, it remains plausible and continued medical monitoring over time will be important.   Kim Krueger was accompanied by her husband during the current feedback session. Content of the current session focused on the results of her neuropsychological evaluation. Kim Krueger was given the opportunity to ask  questions and her questions were answered. She was encouraged to reach out should additional questions arise. A copy of her report was provided at the conclusion of the visit.      Greater than 31 minutes were spent preparing for, conducting, and documenting the current feedback session with Kim Krueger, billed as one unit [phone].

## 2022-05-18 ENCOUNTER — Telehealth: Payer: Self-pay

## 2022-05-18 NOTE — Telephone Encounter (Signed)
Patient called in today about UA results that was done in February 2024 at her last office visit and wondering if a clinical staff member was supposed to call her with results. Per Dr.McKenzie office notes patient was given a Rx for Macrodantin 50 mg a nighty abt and prior Rx sent for Macrobid 100 mg bid X 7 days the month before (January 2024) and she would have not received a phone call from clinical staff due to Rx sent to pharmacy following both office visit. Patient voiced that the abt is really helping her and she feels better than she felt in 2 years.

## 2022-05-19 ENCOUNTER — Ambulatory Visit (INDEPENDENT_AMBULATORY_CARE_PROVIDER_SITE_OTHER): Payer: Medicare Other | Admitting: Gastroenterology

## 2022-05-19 VITALS — BP 169/77 | HR 75 | Temp 99.0°F | Ht 63.0 in | Wt 152.7 lb

## 2022-05-19 DIAGNOSIS — R195 Other fecal abnormalities: Secondary | ICD-10-CM | POA: Diagnosis not present

## 2022-05-19 DIAGNOSIS — D509 Iron deficiency anemia, unspecified: Secondary | ICD-10-CM

## 2022-05-19 NOTE — Progress Notes (Addendum)
Referring Provider: Gaspar Garbe, MD Primary Care Physician:  Gaspar Garbe, MD Primary GI Physician: new   Chief Complaint  Patient presents with   positive stool card    Referred for positive stool card. States its been about 10 years since last colonoscopy.    HPI:   Kim Krueger is a 82 y.o. female with past medical history of mild cognitive impairment, hypothyroidism, OA, temporal arteritis.   Patient presenting today as a new patient for positive occult stool card  Postive FOBT in February 2023, mild anemia with hgb 11.6 in feb 2024, 11 in July 2023, MCV WNL, iron studies with ferritin 28.3, TIBC 353, Iron 68, sat 19%, BUN has been WNL (appears to have had mild anemia for the past 6+ years).  Present:  Patient reports that she had some toilet tissue hematochezia a while back when she was on a new medication for her ongoing UTIs and thought this was secondary to hemorrhoids in setting of constipation. She had annual checkup with her PCP when she had occult stool card that was positive. She states that she is no longer having any rectal bleeding. Denies melena. No abdominal pain, or changes in bowel habits. She notes that she had some mild weight loss after a hospitalization for altered mental status. Usually has a BM every other day. Denies any issues with constipation at this time. Patient states she feels the best she has felt in some time. She notes she had IDA a few years ago and took an iron supplement at that time but is no longer taking this.    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  The Hereditary Gene Panel offered by [facility] includes sequencing and/or deletion duplication testing of the following 47 genes: APC, ATM, AXIN2, BARD1, BMPR1A, BRCA1, BRCA2, BRIP1, CDH1, CDK4, CDKN2A (p14ARF), CDKN2A (p16INK4a), CHEK2, CTNNA1, DICER1, EPCAM (Deletion/duplication testing only), GREM1 (promoter region deletion/duplication   History of uterine cancer 03/14/2018   s/p hysterectomy   Hypokalemia 07/14/2021   Hypothyroidism    Impingement syndrome of right shoulder region 03/13/2021   Malignant neoplasm of upper-inner quadrant of left breast in female, estrogen receptor positive 02/12/2018   Metatarsalgia of right foot 03/13/2021   OAB (overactive bladder) 12/13/2021   Osteoarthritis of left knee 03/16/2018   Osteoporosis 12/28/2021   Pain in joint of right shoulder 03/13/2021   Pain in left knee 12/13/2017   Prolonged QT interval 07/14/2021   Sepsis due to undetermined organism 07/14/2021   Temporal arteritis    Urge incontinence    UTI (urinary tract infection) 07/14/2021    Past Surgical History:  Procedure Laterality Date   ABDOMINAL HYSTERECTOMY     ovaries removed as well   APPENDECTOMY     appendix ruptured as child, still have part of appendix   ARTERY BIOPSY Right 10/19/2015   Procedure: RIGHT TEMPORAL ARTERY BIOPSY;  Surgeon: Jimmye Norman, MD;  Location: [facility];  Service: General;  Laterality: Right;  BREAST LUMPECTOMY WITH RADIOACTIVE SEED LOCALIZATION Left 02/28/2018   Procedure: LEFT BREAST LUMPECTOMY WITH RADIOACTIVE SEED LOCALIZATION;  Surgeon: Glenna Fellows, MD;  Location: [facility];  Service: General;  Laterality: Left;   HERNIA REPAIR  03/2016   umbilical   INSERTION OF MESH N/A 03/28/2016   Procedure: INSERTION OF MESH;  Surgeon: Jimmye Norman, MD;  Location: [facility];  Service: General;  Laterality: N/A;   UMBILICAL  HERNIA REPAIR N/A 03/28/2016   Procedure: LAPAROSCOPIC UMBILICAL HERNIA;  Surgeon: Jimmye Norman, MD;  Location: [facility] OR;  Service: General;  Laterality: N/A;    Current Outpatient Medications  Medication Sig Dispense Refill   acetaminophen (TYLENOL) 500 MG tablet Take 500 mg by mouth every 6 (six) hours as needed. Takes every 6 hours as needed     anastrozole (ARIMIDEX) 1 MG tablet TAKE 1 TABLET(1 MG) BY MOUTH DAILY 90 tablet 3   ibuprofen (ADVIL) 200 MG tablet Take 200 mg by mouth. 3 motrin every 6 hours as needed     levothyroxine (SYNTHROID) 88 MCG tablet Take 1 tablet by mouth daily.     memantine (NAMENDA) 5 MG tablet Take 1 tablet (5 mg at night) for 2 weeks, then increase to 1 tablet (5 mg) twice a day 60 tablet 11   nitrofurantoin (MACRODANTIN) 50 MG capsule Take 1 capsule (50 mg total) by mouth at bedtime. 30 capsule 11   No current facility-administered medications for this visit.    Allergies as of 05/19/2022 - Review Complete 05/19/2022  Allergen Reaction Noted   Ceftin [cefuroxime axetil] Nausea Only 10/01/2015   Hydrocodone  05/13/2016   Other  07/22/2021   Solifenacin  05/19/2022   Sulfa antibiotics Other (See Comments) 10/01/2015   Tramadol Other (See Comments) 03/23/2016   Penicillins Rash 10/01/2015    Family History  Problem Relation Age of Onset   Colon cancer Mother 80       d. 69   Heart disease Father    Dementia Maternal Grandmother    Alzheimer's disease Maternal Grandmother    Dementia Maternal Uncle    Alzheimer's disease Maternal Uncle    Ovarian cancer Cousin        died at 18; pat first cousin   Alzheimer's disease Cousin    Dementia Cousin 50       mat first cousin    Social History   Socioeconomic History   Marital status: Married    Spouse name: Not on file   Number of children: 1   Years of education: 14   Highest education level: Some college, no degree  Occupational History   Occupation: Retired    Comment: Admin  Tobacco Use    Smoking status: Never   Smokeless tobacco: Never  Vaping Use   Vaping Use: Never used  Substance and Sexual Activity   Alcohol use: No   Drug use: No   Sexual activity: Not on file  Other Topics Concern   Not on file  Social History Narrative   Right    Two story home   Lives with husband   Social Determinants of Health   Financial Resource Strain: Not on file  Food Insecurity: Not on file  Transportation Needs: No Transportation Needs (03/14/2018)   PRAPARE - Administrator, Civil Service (Medical): No    Lack of Transportation (Non-Medical): No  Physical Activity: Not on file  Stress: Not on file  Social Connections: Not on file   Review of systems  General: negative for malaise, night sweats, fever, chills, weight loss Neck: Negative for lumps, goiter, pain and significant neck swelling Resp: Negative for cough, wheezing, dyspnea at rest CV: Negative for chest pain, leg swelling, palpitations, orthopnea GI: denies melena, hematochezia, nausea, vomiting, diarrhea, constipation, dysphagia, odyonophagia, early satiety or unintentional weight loss.  MSK: Negative for joint pain or swelling, back pain, and muscle pain. Derm: Negative for itching or rash Psych: Denies depression, anxiety, memory loss, confusion. No homicidal or suicidal ideation.  Heme: Negative for prolonged bleeding, bruising easily, and swollen nodes. Endocrine: Negative for cold or heat intolerance, polyuria, polydipsia and goiter. Neuro: negative for tremor, gait imbalance, syncope and seizures. The remainder of the review of systems is noncontributory.  Physical Exam: BP (!) 169/77   Pulse 75   Temp 99 F (37.2 C) (Oral)   Ht 5\' 3"  (1.6 m)   Wt 152 lb 11.2 oz (69.3 kg)   BMI 27.05 kg/m  General:   Alert and oriented. No distress noted. Pleasant and cooperative.  Head:  Normocephalic and atraumatic. Eyes:  Conjuctiva clear without scleral icterus. Mouth:  Oral mucosa pink and moist.  Good dentition. No lesions. Heart: Normal rate and rhythm, s1 and s2 heart sounds present.  Lungs: Clear lung sounds in all lobes. Respirations equal and unlabored. Abdomen:  +BS, soft, non-tender and non-distended. No rebound or guarding. No HSM or masses noted. Derm: No palmar erythema or jaundice Msk:  Symmetrical without gross deformities. Normal posture. Extremities:  Without edema. Neurologic:  Alert and  oriented x4 Psych:  Alert and cooperative. Normal mood and affect.  Invalid input(s): "6 MONTHS"   ASSESSMENT: Kim Krueger is a 82 y.o. female presenting today as a new patient for FOBT positive and anemia with low normal iron.   Heme positive stool with mild anemia and low normal iron studies, in presence of ongoing NSAID use and family history of CRC, patient is certainly at risk for sources of GI bleeding/malignancy which I discussed in depth with the patient and her husband, I recommend EGD/colonoscopy for further evaluation as I cannot rule out PUD, gastritis, duodenitis, AVMs, malignancy. However at this time patient does not wish to pursue endoscopic evaluations. I reiterated the above reasons for my recommendations and that ultimately malignancy cannot be ruled out. She still wishes to hold off on any endoscopic procedures. She will make me aware if she changes her mind or has any new or worsening GI symptoms. I advised her to try and limit/decrease NSAIDs (advil, aleve, naproxen, goody powder, ibuprofen) as these can be very hard on the GI tract, causing inflammation, ulcers and damage to the lining of your GI tract.    PLAN:  Pt to let me know if she wishes to proceed with EGD/colonoscopy  2. Avoid frequent NSAID use  3. Pt to make me aware of new/worsening GI symptoms   All questions were answered, patient verbalized understanding and is in agreement with plan as outlined above.    Follow Up: PRN  Nathanel Tallman L. Jeanmarie Hubert, MSN, APRN, AGNP-C Adult-Gerontology Nurse  Practitioner [facility]  I have reviewed the note and agree with the APP's assessment as described in this progress note  Katrinka Blazing, MD Gastroenterology and Hepatology [facility]

## 2022-05-19 NOTE — Patient Instructions (Signed)
As we discussed, I would recommend upper endoscopy and colonoscopy given blood in your stool on testing and your lower blood counts as I cannot rule out a source of bleeding in the GI tract and with your family history of colon cancer you are certainly at a higher risk for this. Please be mindful of NSAIDs (advil, aleve, naproxen, goody powder, ibuprofen) as these can be very hard on your GI tract, causing inflammation, ulcers and damage to the lining of your GI tract, especially with frequent, ongoing use  If you decide you would like to proceed with EGD/colonoscopy or you have new or worsening GI symptoms, please make me aware  It was a pleasure to see you today. I want to create trusting relationships with patients and provide genuine, compassionate, and quality care. I truly value your feedback! please be on the lookout for a survey regarding your visit with me today. I appreciate your input about our visit and your time in completing this!    Davin Muramoto L. Jeanmarie Hubert, MSN, APRN, AGNP-C Adult-Gerontology Nurse Practitioner [facility]astroenterology at [facility]

## 2022-05-30 ENCOUNTER — Encounter: Payer: Self-pay | Admitting: Urology

## 2022-05-30 ENCOUNTER — Ambulatory Visit: Payer: Medicare Other | Admitting: Urology

## 2022-05-30 VITALS — BP 146/76 | HR 80

## 2022-05-30 DIAGNOSIS — N3281 Overactive bladder: Secondary | ICD-10-CM

## 2022-05-30 DIAGNOSIS — Z87440 Personal history of urinary (tract) infections: Secondary | ICD-10-CM

## 2022-05-30 LAB — URINALYSIS, ROUTINE W REFLEX MICROSCOPIC
Bilirubin, UA: NEGATIVE
Glucose, UA: NEGATIVE
Ketones, UA: NEGATIVE
Leukocytes,UA: NEGATIVE
Nitrite, UA: NEGATIVE
Protein,UA: NEGATIVE
RBC, UA: NEGATIVE
Specific Gravity, UA: 1.025 (ref 1.005–1.030)
Urobilinogen, Ur: 0.2 mg/dL (ref 0.2–1.0)
pH, UA: 6.5 (ref 5.0–7.5)

## 2022-05-30 MED ORDER — NITROFURANTOIN MACROCRYSTAL 50 MG PO CAPS: 50.0000 mg | ORAL_CAPSULE | Freq: Every day | ORAL | 11 refills | Status: DC

## 2022-05-30 NOTE — Progress Notes (Unsigned)
05/30/2022 11:04 AM   GARNET SPAKES 05-07-1940 [id number]  Referring provider: Gaspar Garbe, MD [address] [place],  [place] [address]  Followup OAb and macrodantin   HPI: Ms Carbone is a 81yo here for followup for OAB and macrodantin. She previously took mirabegron which failed to improve her OAB symptoms. She was previously given gemtesa but only took it for 1 week. No UTIs since last visit. NO dysuria or hematuria.    PMH: Past Medical History:  Diagnosis Date   Acute metabolic encephalopathy 07/14/2021   Amnestic MCI (mild cognitive impairment with memory loss) 04/13/2022   Family history of colon cancer    Family history of ovarian cancer    Genetic testing 03/30/2018   DICER1 VUS identified on the common hereditary cancer panel.  The Hereditary Gene Panel offered by [facility] includes sequencing and/or deletion duplication testing of the following 47 genes: APC, ATM, AXIN2, BARD1, BMPR1A, BRCA1, BRCA2, BRIP1, CDH1, CDK4, CDKN2A (p14ARF), CDKN2A (p16INK4a), CHEK2, CTNNA1, DICER1, EPCAM (Deletion/duplication testing only), GREM1 (promoter region deletion/duplication   History of uterine cancer 03/14/2018   s/p hysterectomy   Hypokalemia 07/14/2021   Hypothyroidism    Impingement syndrome of right shoulder region 03/13/2021   Malignant neoplasm of upper-inner quadrant of left breast in female, estrogen receptor positive 02/12/2018   Metatarsalgia of right foot 03/13/2021   OAB (overactive bladder) 12/13/2021   Osteoarthritis of left knee 03/16/2018   Osteoporosis 12/28/2021   Pain in joint of right shoulder 03/13/2021   Pain in left knee 12/13/2017   Prolonged QT interval 07/14/2021   Sepsis due to undetermined organism 07/14/2021   Temporal arteritis    Urge incontinence    UTI (urinary tract infection) 07/14/2021    Surgical History: Past Surgical History:  Procedure Laterality Date   ABDOMINAL HYSTERECTOMY     ovaries removed as well   APPENDECTOMY      appendix ruptured as child, still have part of appendix   ARTERY BIOPSY Right 10/19/2015   Procedure: RIGHT TEMPORAL ARTERY BIOPSY;  Surgeon: Jimmye Norman, MD;  Location: [facility];  Service: General;  Laterality: Right;   BREAST LUMPECTOMY WITH RADIOACTIVE SEED LOCALIZATION Left 02/28/2018   Procedure: LEFT BREAST LUMPECTOMY WITH RADIOACTIVE SEED LOCALIZATION;  Surgeon: Glenna Fellows, MD;  Location: [facility];  Service: General;  Laterality: Left;   HERNIA REPAIR  03/2016   umbilical   INSERTION OF MESH N/A 03/28/2016   Procedure: INSERTION OF MESH;  Surgeon: Jimmye Norman, MD;  Location: [facility];  Service: General;  Laterality: N/A;   UMBILICAL HERNIA REPAIR N/A 03/28/2016   Procedure: LAPAROSCOPIC UMBILICAL HERNIA;  Surgeon: Jimmye Norman, MD;  Location: [facility];  Service: General;  Laterality: N/A;    Home Medications:  Allergies as of 05/30/2022       Reactions   Ceftin [cefuroxime Axetil] Nausea Only   Caused sever yeast infection   Hydrocodone    Other    Solifenacin    Sulfa Antibiotics Other (See Comments)   Unknown childhood reaction after surgery    Tramadol Other (See Comments)   Cause pain to worsen    Penicillins Rash   Has patient had a PCN reaction causing immediate rash, facial/tongue/throat swelling, SOB or lightheadedness with hypotension: Yes Has patient had a PCN reaction causing severe rash involving mucus membranes or skin necrosis: No Has patient had a PCN reaction that required hospitalization No Has patient had a PCN reaction occurring within the last 10 years: No  If all of the above answers are "NO", then may proceed with Cephalosporin use.        Medication List        Accurate as of May 30, 2022 11:04 AM. If you have any questions, ask your nurse or doctor.          acetaminophen 500 MG tablet Commonly known as: TYLENOL Take 500 mg by mouth every 6 (six) hours as needed. Takes every 6 hours as needed    anastrozole 1 MG tablet Commonly known as: ARIMIDEX TAKE 1 TABLET(1 MG) BY MOUTH DAILY   ibuprofen 200 MG tablet Commonly known as: ADVIL Take 200 mg by mouth. 3 motrin every 6 hours as needed   levothyroxine 88 MCG tablet Commonly known as: SYNTHROID Take 1 tablet by mouth daily.   memantine 5 MG tablet Commonly known as: NAMENDA Take 1 tablet (5 mg at night) for 2 weeks, then increase to 1 tablet (5 mg) twice a day   nitrofurantoin 50 MG capsule Commonly known as: Macrodantin Take 1 capsule (50 mg total) by mouth at bedtime.        Allergies:  Allergies  Allergen Reactions   Ceftin [Cefuroxime Axetil] Nausea Only    Caused sever yeast infection   Hydrocodone    Other    Solifenacin    Sulfa Antibiotics Other (See Comments)    Unknown childhood reaction after surgery    Tramadol Other (See Comments)    Cause pain to worsen    Penicillins Rash    Has patient had a PCN reaction causing immediate rash, facial/tongue/throat swelling, SOB or lightheadedness with hypotension: Yes Has patient had a PCN reaction causing severe rash involving mucus membranes or skin necrosis: No Has patient had a PCN reaction that required hospitalization No Has patient had a PCN reaction occurring within the last 10 years: No If all of the above answers are "NO", then may proceed with Cephalosporin use.     Family History: Family History  Problem Relation Age of Onset   Colon cancer Mother 46       d. 61   Heart disease Father    Dementia Maternal Grandmother    Alzheimer's disease Maternal Grandmother    Dementia Maternal Uncle    Alzheimer's disease Maternal Uncle    Ovarian cancer Cousin        died at 53; pat first cousin   Alzheimer's disease Cousin    Dementia Cousin 81       mat first cousin    Social History:  reports that she has never smoked. She has never used smokeless tobacco. She reports that she does not drink alcohol and does not use drugs.  ROS: All other  review of systems were reviewed and are negative except what is noted above in HPI  Physical Exam: BP (!) 146/76   Pulse 80   Constitutional:  Alert and oriented, No acute distress. HEENT: [place] AT, moist mucus membranes.  Trachea midline, no masses. Cardiovascular: No clubbing, cyanosis, or edema. Respiratory: Normal respiratory effort, no increased work of breathing. GI: Abdomen is soft, nontender, nondistended, no abdominal masses GU: No CVA tenderness.  Lymph: No cervical or inguinal lymphadenopathy. Skin: No rashes, bruises or suspicious lesions. Neurologic: Grossly intact, no focal deficits, moving all 4 extremities. Psychiatric: Normal mood and affect.  Laboratory Data: Lab Results  Component Value Date   WBC 11.3 (H) 07/15/2021   HGB 9.2 (L) 07/15/2021   HCT 29.2 (L) 07/15/2021  MCV 97.0 07/15/2021   PLT 270 07/15/2021    Lab Results  Component Value Date   CREATININE 0.63 07/15/2021    No results found for: "PSA"  No results found for: "TESTOSTERONE"  No results found for: "HGBA1C"  Urinalysis    Component Value Date/Time   COLORURINE YELLOW 07/13/2021 2020   APPEARANCEUR Clear 03/11/2022 1121   LABSPEC 1.012 07/13/2021 2020   PHURINE 6.0 07/13/2021 2020   GLUCOSEU Negative 03/11/2022 1121   HGBUR MODERATE (A) 07/13/2021 2020   BILIRUBINUR Negative 03/11/2022 1121   KETONESUR 20 (A) 07/13/2021 2020   PROTEINUR Negative 03/11/2022 1121   PROTEINUR 30 (A) 07/13/2021 2020   NITRITE Negative 03/11/2022 1121   NITRITE NEGATIVE 07/13/2021 2020   LEUKOCYTESUR Negative 03/11/2022 1121   LEUKOCYTESUR SMALL (A) 07/13/2021 2020    Lab Results  Component Value Date   LABMICR See below: 03/11/2022   WBCUA 0-5 03/11/2022   LABEPIT 0-10 03/11/2022   MUCUS Present (A) 11/11/2021   BACTERIA None seen 03/11/2022    Pertinent Imaging: *** No results found for this or any previous visit.  No results found for this or any previous visit.  No results found for  this or any previous visit.  No results found for this or any previous visit.  No results found for this or any previous visit.  No valid procedures specified. No results found for this or any previous visit.  No results found for this or any previous visit.   Assessment & Plan:    1. History of UTI -continue macrodantin 50mg  qhs - Urinalysis, Routine w reflex microscopic  2. OAB (overactive bladder) ***   No follow-ups on file.  Wilkie Aye, MD  [facility] [place]

## 2022-05-30 NOTE — Patient Instructions (Signed)

## 2022-07-25 ENCOUNTER — Encounter: Payer: Self-pay | Admitting: Physician Assistant

## 2022-07-25 ENCOUNTER — Ambulatory Visit: Payer: Medicare Other | Admitting: Physician Assistant

## 2022-07-25 VITALS — BP 143/81 | HR 71 | Ht 63.5 in | Wt 152.2 lb

## 2022-07-25 DIAGNOSIS — R413 Other amnesia: Secondary | ICD-10-CM

## 2022-07-25 DIAGNOSIS — G3184 Mild cognitive impairment, so stated: Secondary | ICD-10-CM

## 2022-07-25 MED ORDER — MEMANTINE HCL 5 MG PO TABS: ORAL_TABLET | ORAL | 3 refills | Status: DC

## 2022-07-25 NOTE — Progress Notes (Signed)
Assessment/Plan:    Amnestic MCI of unclear etiology  Kim Krueger is a very pleasant 82 y.o. RH female with  a history of hypertension, hyperlipidemia, recent sepsis (06/2021), history of temporal arteritis, hypothyroidism, OAB with recurring UTIs, prolonged QT interval, anemia and a diagnosis of amnestic MCI per neuropsych evaluation on March 2024, presenting today in follow-up for evaluation of memory loss. Patient is on memantine 5 mg bid, tolerating well. Her MMSE today is stable ar 28/30. Patient is able to participate on his IADLs and to drive without difficulties.         Recommendations:   Follow up in  6 months. Repeat neuropsych evaluation in  for clarity of the diagnosis and disease trajectory Continue memantine 5 mg twice daily, side effects asked Recommend good control of cardiovascular risk factors Continue to control mood as per PCP    Subjective:   This patient is accompanied in the office by her husband  who supplements the history. Previous records as well as any outside records available were reviewed prior to todays visit.   Patient was last seen on 01/24/2022 with an MMSE of 29/30     Any changes in memory since last visit? ".  She has some difficulty remembering recent conversations and names of people, but she tries to read extensively, and watch the news on her phone. "I feel that my brain is overloaded sometimes" repeats oneself?  Endorsed by her husband  Disoriented when walking into a room?  Patient denies except occasionally not remembering what patient came to the room for  Leaving objects in unusual places?  Patient denies, but "she does forget her purse in different places "  Wandering behavior?   denies   Any personality changes since last visit?   denies   Any worsening depression?: denies   Hallucinations or paranoia?  denies   Seizures?   denies    Any sleep changes? Sleeps well Denies vivid dreams, REM behavior or sleepwalking   Sleep apnea?    denies    Any hygiene concerns?   denies   Independent of bathing and dressing?  Endorsed  Does the patient needs help with medications? Patient is in charge   Who is in charge of the finances?  Husband is in charge     Any changes in appetite?  denies     Patient have trouble swallowing?  denies   Does the patient cook?  Any kitchen accidents such as leaving the stove on?   denies   Any headaches?    denies   Vision changes? denies Chronic pain?  denies   Ambulates with difficulty?   She has Left knee OA , will require surgery soon, this limits her mobility .   Recent falls or head injuries?    denies      Unilateral weakness, numbness or tingling?   denies   Any tremors?  denies   Any anosmia?    denies   Any incontinence of urine?  She reports frequent UTIs as before, she follows with urology.    Any bowel dysfunction?  Denies  Patient lives with her husband  Does the patient drive?  Endorsed, short distances.    Neuropsych evaluation March 2024 Briefly, results suggested prominent impairment surrounding delayed retrieval and recognition/consolidation aspects of learning and memory. Isolated deficits were also noted across response inhibition and a line orientation task. However, all other performances assessing executive functioning and visuospatial abilities respectively were appropriate. The etiology for ongoing  memory impairment is unclear. Across memory testing, Kim Krueger was able to learn novel information reasonably well. However, after a brief delay, she was fully amnestic (i.e., 0% retention) across both verbal memory tasks and her retention was only 12% across a single visual memory task. While she did benefit from cueing across story content, performance across recognition trials for list and figure memory tasks were quite poor. Taken together, this would suggest rapid forgetting, as well as concerns for an evolving and already fairly significant storage impairment. This  pattern of memory loss can be characteristic of Alzheimer's disease and while I am unable to rule in this condition presently, it remains plausible and continued medical monitoring over time will be important.      Initial visit 07/22/21  How long did patient have memory difficulties?  She reports that her memory worsened  over the last  4-6  months. "Took a medicine when I was in the hospital  that may have had something to do with it" Patient lives with: Spouse who reports that her memory is worse over the last 6 months  "couldn't remember the date and time" . Daughter reports that her memory deficits are"longer than that,maybe 9 mo to  year,  writing things all the time such as conversations and appointments because she otherwise could not remember"  repeats oneself? Endorsed Disoriented when walking into a room?  Patient denies   Leaving objects in unusual places?  Patient denies  but  husband  reports that she places her purse anywhere. " The other day she cod not remember where she left her phone we moved the house upside down and then she realized the phone was in her pocket "  Ambulates  with difficulty?   Patient denies   Recent falls?  Endorsed " I have some knee issues so  I use a walker when needed " Any head injuries?  Patient denies   History of seizures?   Patient denies   Wandering behavior?  Patient denies   Patient drives?   Patient drives very short distances " to the bank, beauty salon, grocery and drugstore" Any mood changes such irritability agitation? "She is a lot nicer than she was"- daughter says  Any history of depression?:  Patient denies   Hallucinations?  Patient denies at this moment, "only when I was in the hospital"   Paranoia?  Patient denies   Patient reports that he sleeps well without vivid dreams, REM behavior or sleepwalking   History of sleep apnea?  Patient denies   Any hygiene concerns?  Patient denies   Independent of bathing and dressing?  Endorsed   Does the patient needs help with medications?  Husband in charge Who is in charge of the finances? Husband is in charge    Any changes in appetite?  Patient denies, but " she doesn't drink enough water"  Patient have trouble swallowing? Patient denies   Does the patient cook?  Patient denies   Any kitchen accidents such as leaving the stove on? Husband does the cooking  Any headaches?  Patient denies  TA  Double vision? Patient denies   Any focal numbness or tingling?  Patient denies   Chronic back pain Patient denies   Unilateral weakness?  Patient denies   Any tremors?  Patient denies   Any history of anosmia?  Patient denies   Any incontinence of urine?  Incontinence wears pads. Frequent UTIs Any bowel dysfunction?   Patient denies    History  of heavy alcohol intake?  Patient denies   History of heavy tobacco use?  Patient denies   Family history of dementia?   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  The Hereditary Gene Panel offered by [facility] includes sequencing and/or deletion duplication testing of the following 47 genes: APC, ATM, AXIN2, BARD1, BMPR1A, BRCA1, BRCA2, BRIP1, CDH1, CDK4, CDKN2A (p14ARF), CDKN2A (p16INK4a), CHEK2, CTNNA1, DICER1, EPCAM (Deletion/duplication testing only), GREM1 (promoter region deletion/duplication   History of uterine cancer 03/14/2018   s/p hysterectomy   Hypokalemia 07/14/2021   Hypothyroidism    Impingement syndrome of right shoulder region 03/13/2021   Malignant neoplasm of upper-inner quadrant of left breast in female,  estrogen receptor positive 02/12/2018   Metatarsalgia of right foot 03/13/2021   OAB (overactive bladder) 12/13/2021   Osteoarthritis of left knee 03/16/2018   Osteoporosis 12/28/2021   Pain in joint of right shoulder 03/13/2021   Pain in left knee 12/13/2017   Prolonged QT interval 07/14/2021   Sepsis due to undetermined organism 07/14/2021   Temporal arteritis    Urge incontinence    UTI (urinary tract infection) 07/14/2021     Past Surgical History:  Procedure Laterality Date   ABDOMINAL HYSTERECTOMY     ovaries removed as well   APPENDECTOMY     appendix ruptured as child, still have part of appendix   ARTERY BIOPSY Right 10/19/2015   Procedure: RIGHT TEMPORAL ARTERY BIOPSY;  Surgeon: Jimmye Norman, MD;  Location: [facility];  Service: General;  Laterality: Right;   BREAST LUMPECTOMY WITH RADIOACTIVE SEED LOCALIZATION Left 02/28/2018   Procedure: LEFT BREAST LUMPECTOMY WITH RADIOACTIVE SEED LOCALIZATION;  Surgeon: Glenna Fellows, MD;  Location: [facility];  Service: General;  Laterality: Left;   HERNIA REPAIR  03/2016   umbilical   INSERTION OF MESH N/A 03/28/2016   Procedure: INSERTION OF MESH;  Surgeon: Jimmye Norman, MD;  Location: [facility];  Service: General;  Laterality: N/A;   UMBILICAL HERNIA REPAIR N/A 03/28/2016   Procedure: LAPAROSCOPIC UMBILICAL HERNIA;  Surgeon: Jimmye Norman, MD;  Location: [facility];  Service: General;  Laterality: N/A;     PREVIOUS MEDICATIONS:   CURRENT MEDICATIONS:  Outpatient Encounter Medications as of 07/25/2022  Medication Sig   acetaminophen (TYLENOL) 500 MG tablet Take 500 mg by mouth every 6 (six) hours as needed. Takes every 6 hours as needed   anastrozole (ARIMIDEX) 1 MG tablet TAKE 1 TABLET(1 MG) BY MOUTH DAILY   ibuprofen (ADVIL) 200 MG tablet Take 200 mg by mouth. 3 motrin every 6 hours as needed   levothyroxine (SYNTHROID) 88 MCG tablet Take 1 tablet by mouth daily.   nitrofurantoin (MACRODANTIN) 50 MG capsule  Take 1 capsule (50 mg total) by mouth at bedtime.   [DISCONTINUED] memantine (NAMENDA) 5 MG tablet Take 1 tablet (5 mg at night) for 2 weeks, then increase to 1 tablet (5 mg) twice a day   memantine (NAMENDA) 5 MG tablet Take 1 tablet twice a day   No facility-administered encounter medications on file as of 07/25/2022.     Objective:  PHYSICAL EXAMINATION:    VITALS:   Vitals:   07/25/22 1103  BP: (!) 143/81  Pulse: 71  SpO2: 96%  Weight: 152 lb 3.2 oz (69 kg)  Height: 5' 3.5" (1.613 m)    GEN:  The patient appears stated age and is in NAD. HEENT:  Normocephalic, atraumatic.   Neurological examination:  General: NAD, well-groomed, appears stated age. Orientation: The patient is alert. Oriented to person, place and time Cranial nerves: There is good facial symmetry.The speech is fluent and clear. No aphasia or dysarthria. Fund of knowledge is appropriate. Recent memory impaired and remote memory is normal.  Attention and concentration are normal.  Able to name objects and repeat phrases.  Hearing is intact to conversational tone.   Delayed recall 2/3 Sensation: Sensation is intact to light touch throughout Motor: Strength is at least antigravity x4. DTR's 2/4 in UE/LE      07/24/2021    3:00 PM  Montreal Cognitive Assessment   Visuospatial/ Executive (0/5) 0  Naming (0/3) 3  Attention: Read list of digits (0/2) 2  Attention: Read list of letters (0/1) 1  Attention: Serial 7 subtraction starting at 100 (0/3) 2  Language: Repeat phrase (0/2) 2  Language : Fluency (0/1) 0  Abstraction (0/2) 2  Delayed Recall (0/5) 0  Orientation (0/6) 5  Total 17  Adjusted Score (based on education) 17       07/25/2022   12:00 PM 01/24/2022   10:00 AM  MMSE - Mini Mental State Exam  Orientation to time 4 5  Orientation to Place 5 5  Registration 3 3  Attention/ Calculation 5 5  Recall 2 2  Language- name 2 objects 2 2  Language- repeat 1 1  Language- follow 3 step command 3 3   Language- read & follow direction 1 1  Write a sentence 1 1  Copy design 1 1  Total score 28 29       Movement examination: Tone: There is normal tone in the UE/LE Abnormal movements:  no tremor.  No myoclonus.  No asterixis.   Coordination:  There is no decremation with RAM's. Normal finger to nose  Gait and Station: The patient has no difficulty arising out of a deep-seated chair without the use of the hands. The patient's stride length is good.  Gait is cautious and narrow.   Thank you for allowing [place] the opportunity to participate in the care of this nice patient. Please do not hesitate to contact us for any questions or concerns.   Total time spent on today's visit was 35 minutes dedicated to this patient today, preparing to see patient, examining the patient, ordering tests and/or medications and counseling the patient, documenting clinical information in the EHR or other health record, independently interpreting results and communicating results to the patient/family, discussing treatment and goals, answering patient's questions and coordinating care.  Cc:  Tisovec, Adelfa Koh, MD  Marlowe Kays 07/25/2022 12:28 PM

## 2022-07-25 NOTE — Patient Instructions (Signed)
.It was a pleasure to see you today at our office.   Recommendations:   Follow up in 6  months. Repeat neurocognitive testing for clarity of the diagnosis   Continue memantine 10 mg twice daily, side effects discussed Recommend good control of cardiovascular risk factors  Whom to call:  Memory  decline, memory medications: Call our office [phone]   For psychiatric meds, mood meds: Please have your primary care physician manage these medications.   For assessment of decision of mental capacity and competency:  Call Dr. Erick Blinks, geriatric psychiatrist at [phone]  For guidance in geriatric dementia issues please call [facility] [phone]  For guidance regarding [facility] and if placement were needed at the facility, contact Sidney Ace, Social Worker tel: [phone]  If you have any severe symptoms of a stroke, or other severe issues such as confusion,severe chills or fever, etc call 911 or go to the ER as you may need to be evaluated further   Feel free to visit Facebook page " [email or web address]" for tips of how to care for people with memory problems.      RECOMMENDATIONS FOR ALL PATIENTS WITH MEMORY PROBLEMS: 1. Continue to exercise (Recommend 30 minutes of walking everyday, or 3 hours every week) 2. Increase social interactions - continue going to [place] and enjoy social gatherings with friends and family 3. Eat healthy, avoid fried foods and eat more fruits and vegetables 4. Maintain adequate blood pressure, blood sugar, and blood cholesterol level. Reducing the risk of stroke and cardiovascular disease also helps promoting better memory. 5. Avoid stressful situations. Live a simple life and avoid aggravations. Organize your time and prepare for the next day in anticipation. 6. Sleep well, avoid any interruptions of sleep and avoid any distractions in the bedroom that may interfere with adequate sleep quality 7. Avoid sugar, avoid  sweets as there is a strong link between excessive sugar intake, diabetes, and cognitive impairment We discussed the Mediterranean diet, which has been shown to help patients reduce the risk of progressive memory disorders and reduces cardiovascular risk. This includes eating fish, eat fruits and green leafy vegetables, nuts like almonds and hazelnuts, walnuts, and also use olive oil. Avoid fast foods and fried foods as much as possible. Avoid sweets and sugar as sugar use has been linked to worsening of memory function.  There is always a concern of gradual progression of memory problems. If this is the case, then we may need to adjust level of care according to patient needs. Support, both to the patient and caregiver, should then be put into place.    FALL PRECAUTIONS: Be cautious when walking. Scan the area for obstacles that may increase the risk of trips and falls. When getting up in the mornings, sit up at the edge of the bed for a few minutes before getting out of bed. Consider elevating the bed at the head end to avoid drop of blood pressure when getting up. Walk always in a well-lit room (use night lights in the walls). Avoid area rugs or power cords from appliances in the middle of the walkways. Use a walker or a cane if necessary and consider physical therapy for balance exercise. Get your eyesight checked regularly.  FINANCIAL OVERSIGHT: Supervision, especially oversight when making financial decisions or transactions is also recommended.  HOME SAFETY: Consider the safety of the kitchen when operating appliances like stoves, microwave oven, and blender. Consider having supervision and share cooking responsibilities until no longer  able to participate in those. Accidents with firearms and other hazards in the house should be identified and addressed as well.   ABILITY TO BE LEFT ALONE: If patient is unable to contact 911 operator, consider using LifeLine, or when the need is there, arrange  for someone to stay with patients. Smoking is a fire hazard, consider supervision or cessation. Risk of wandering should be assessed by caregiver and if detected at any point, supervision and safe proof recommendations should be instituted.  MEDICATION SUPERVISION: Inability to self-administer medication needs to be constantly addressed. Implement a mechanism to ensure safe administration of the medications.   DRIVING: Regarding driving, in patients with progressive memory problems, driving will be impaired. We advise to have someone else do the driving if trouble finding directions or if minor accidents are reported. Independent driving assessment is available to determine safety of driving.   If you are interested in the driving assessment, you can contact the following:  [facility] [employer] [facility] in [place] [phone]  [facility] [phone]  [facility] [phone]  [facility] [phone] or [phone]

## 2022-08-11 NOTE — Progress Notes (Signed)
Name: Kim Krueger DOB: 03-Jan-1941 MRN: [id number]  History of Present Illness: Kim Krueger is a 82 y.o. female who presents today for follow up visit at [facility] . - GU / GYN history: 1. OAB with urinary frequency, urgency, and urge incontinence. - Previously failed Myrbetriq and Gemtesa.  2. Recurrent UTIs.  - Taking Nitrofurantoin 50 mg daily for UTI prophylaxis. 3. History of uterine cancer s/p hysterectomy.  Urine culture results in past 12 months: - 11/04/2021: Positive for Klebsiella pneumoniae - 12/22/2021: Positive for Klebsiella pneumoniae (MDRO)  At last visit with Dr. Ronne Binning on 05/30/2022: The plan was to continue Nitrofurantoin 50 mg daily for UTI prophylaxis.  Today: She reports suspected UTI. She reports increased urinary urgency, frequency, and dysuria. Denies gross hematuria, hesitancy, straining to void, or sensations of incomplete emptying.   Fall Screening: Do you usually have a device to assist in your mobility? Yes - cane  Medications: Current Outpatient Medications  Medication Sig Dispense Refill   acetaminophen (TYLENOL) 500 MG tablet Take 500 mg by mouth every 6 (six) hours as needed. Takes every 6 hours as needed     anastrozole (ARIMIDEX) 1 MG tablet TAKE 1 TABLET(1 MG) BY MOUTH DAILY 90 tablet 3   estradiol (ESTRACE) 0.1 MG/GM vaginal cream Discard plastic applicator. Insert a blueberry size amount (approximately 1 gram) of cream on fingertip inside vagina at bedtime every night for 1 week then 2 nights per week for long term use. 30 g 3   ibuprofen (ADVIL) 200 MG tablet Take 200 mg by mouth. 3 motrin every 6 hours as needed     levothyroxine (SYNTHROID) 88 MCG tablet Take 1 tablet by mouth daily.     memantine (NAMENDA) 5 MG tablet Take 1 tablet twice a day 180 tablet 3   nitrofurantoin, macrocrystal-monohydrate, (MACROBID) 100 MG capsule Take 1 capsule (100 mg total) by mouth 2 (two) times daily for 7 days. 14 capsule 0    nitrofurantoin (MACRODANTIN) 50 MG capsule Take 1 capsule (50 mg total) by mouth at bedtime. 90 capsule 3   No current facility-administered medications for this visit.    Allergies: Allergies  Allergen Reactions   Ceftin [Cefuroxime Axetil] Nausea Only    Caused sever yeast infection   Hydrocodone    Other    Solifenacin    Sulfa Antibiotics Other (See Comments)    Unknown childhood reaction after surgery    Tramadol Other (See Comments)    Cause pain to worsen    Penicillins Rash    Has patient had a PCN reaction causing immediate rash, facial/tongue/throat swelling, SOB or lightheadedness with hypotension: Yes Has patient had a PCN reaction causing severe rash involving mucus membranes or skin necrosis: No Has patient had a PCN reaction that required hospitalization No Has patient had a PCN reaction occurring within the last 10 years: No If all of the above answers are "NO", then may proceed with Cephalosporin use.     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  The Hereditary Gene Panel offered by [facility] includes sequencing and/or deletion duplication testing of the following 47 genes: APC, ATM, AXIN2, BARD1, BMPR1A, BRCA1, BRCA2, BRIP1, CDH1, CDK4, CDKN2A (p14ARF), CDKN2A (p16INK4a), CHEK2, CTNNA1,  DICER1, EPCAM (Deletion/duplication testing only), GREM1 (promoter region deletion/duplication   History of uterine cancer 03/14/2018   s/p hysterectomy   Hypokalemia 07/14/2021   Hypothyroidism    Impingement syndrome of right shoulder region 03/13/2021   Malignant neoplasm of upper-inner quadrant of left breast in female, estrogen receptor positive 02/12/2018   Metatarsalgia of right foot 03/13/2021    OAB (overactive bladder) 12/13/2021   Osteoarthritis of left knee 03/16/2018   Osteoporosis 12/28/2021   Pain in joint of right shoulder 03/13/2021   Pain in left knee 12/13/2017   Prolonged QT interval 07/14/2021   Sepsis due to undetermined organism 07/14/2021   Temporal arteritis    Urge incontinence    UTI (urinary tract infection) 07/14/2021   Past Surgical History:  Procedure Laterality Date   ABDOMINAL HYSTERECTOMY     ovaries removed as well   APPENDECTOMY     appendix ruptured as child, still have part of appendix   ARTERY BIOPSY Right 10/19/2015   Procedure: RIGHT TEMPORAL ARTERY BIOPSY;  Surgeon: Jimmye Norman, MD;  Location: [facility];  Service: General;  Laterality: Right;   BREAST LUMPECTOMY WITH RADIOACTIVE SEED LOCALIZATION Left 02/28/2018   Procedure: LEFT BREAST LUMPECTOMY WITH RADIOACTIVE SEED LOCALIZATION;  Surgeon: Glenna Fellows, MD;  Location: [facility];  Service: General;  Laterality: Left;   HERNIA REPAIR  03/2016   umbilical   INSERTION OF MESH N/A 03/28/2016   Procedure: INSERTION OF MESH;  Surgeon: Jimmye Norman, MD;  Location: [facility];  Service: General;  Laterality: N/A;   UMBILICAL HERNIA REPAIR N/A 03/28/2016   Procedure: LAPAROSCOPIC UMBILICAL HERNIA;  Surgeon: Jimmye Norman, MD;  Location: [facility];  Service: General;  Laterality: N/A;   Family History  Problem Relation Age of Onset   Colon cancer Mother 4       d. 51   Heart disease Father    Dementia Maternal Grandmother    Alzheimer's disease Maternal Grandmother    Dementia Maternal Uncle    Alzheimer's disease Maternal Uncle    Ovarian cancer Cousin        died at 105; pat first cousin   Alzheimer's disease Cousin    Dementia Cousin 46       mat first cousin   Social History   Socioeconomic History   Marital status: Married    Spouse name: Not on file   Number of children: 1   Years of education: 14   Highest education level: Some college, no degree   Occupational History   Occupation: Retired    Comment: Admin  Tobacco Use   Smoking status: Never   Smokeless tobacco: Never  Vaping Use   Vaping status: Never Used  Substance and Sexual Activity   Alcohol use: No   Drug use: No   Sexual activity: Not Currently    Birth control/protection: Surgical  Other Topics Concern   Not on file  Social History Narrative   Right    Two story home   Lives with husband   Social Determinants of Health   Financial Resource Strain: Not on file  Food Insecurity: Not on file  Transportation Needs: No Transportation Needs (03/14/2018)   PRAPARE - Transportation    Lack of Transportation (Medical): No    Lack of Transportation (Non-Medical): No  Physical Activity: Not on file  Stress: Not on file  Social Connections: Not on file  Intimate Partner Violence: Not on file    Review of Systems Constitutional: Patient denies  any unintentional weight loss or change in strength lntegumentary: Patient denies any rashes or pruritus Cardiovascular: Patient denies chest pain or syncope Respiratory: Patient denies shortness of breath Gastrointestinal: Patient denies nausea, vomiting, constipation, or diarrhea Musculoskeletal: Patient denies muscle cramps or weakness Neurologic: Patient denies convulsions or seizures Psychiatric: Patient denies memory problems Allergic/Immunologic: Patient denies recent allergic reaction(s) Hematologic/Lymphatic: Patient denies bleeding tendencies Endocrine: Patient denies heat/cold intolerance  GU: As per HPI.  OBJECTIVE Vitals:   08/12/22 1027  BP: 122/76  Pulse: 65  Temp: 98.4 F (36.9 C)   There is no height or weight on file to calculate BMI.  Physical Examination  Constitutional: No obvious distress; patient is non-toxic appearing  Cardiovascular: No visible lower extremity edema.  Respiratory: The patient does not have audible wheezing/stridor; respirations do not appear labored  Gastrointestinal:  Abdomen non-distended Musculoskeletal: Normal ROM of UEs  Skin: No obvious rashes/open sores  Neurologic: CN 2-12 grossly intact Psychiatric: Answered questions appropriately with normal affect  Hematologic/Lymphatic/Immunologic: No obvious bruises or sites of spontaneous bleeding  UA: positive for >30 WBC/hpf, 3-10 RBC/hpf, bacteria (many) PVR: 24 ml  ASSESSMENT Recurrent UTI - Plan: Urinalysis, Routine w reflex microscopic, BLADDER SCAN AMB NON-IMAGING, nitrofurantoin (MACRODANTIN) 50 MG capsule, nitrofurantoin, macrocrystal-monohydrate, (MACROBID) 100 MG capsule, estradiol (ESTRACE) 0.1 MG/GM vaginal cream, Urine Culture  OAB (overactive bladder) - Plan: Urinalysis, Routine w reflex microscopic, BLADDER SCAN AMB NON-IMAGING, Urine Culture  Abnormal UA. Will check urine culture and treat empirically with Macrobid while awaiting culture results and sensitivities. She was instructed to hold daily low dose antibiotic while taking acute course of antibiotics.  We discussed the possible etiologies of recurrent UTls including ascending infection related to intercourse; vaginal atrophy; transmural infection that has been treated incompletely; urinary tract stones; incomplete bladder emptying with urinary stasis; kidney or bladder tumor; urethral diverticulum; and colonization of  vagina and urinary tract with pathologic, adherent organisms.   For UTI prevention we discussed options including: Adequate fluid intake (>1.5 liters/day) to flush out the urinary tract. - Go to the bathroom to urinate every 4-6 hours while awake to minimize urinary stasis / bacterial overgrowth in the bladder. - Proanthocyanidin (PAC) supplement 36 mg daily; must be soluble (insoluble form of PAC will be ineffective). Recommend Ellura. D-mannose 2 g daily Vitamin C supplement Probiotic to maintain healthy vaginal microbiome - Topical vaginal estrogen for vaginal atrophy. The etiology and consequences of urogenital  epithelial atrophy was explained to patient. The thinning of the epithelium of the urethra can contribute to urinary urgency and frequency syndromes. In addition, the normal bacterial flora that colonizes the perineum may contribute to UTI risk because the thin urethral epithelium allows the bacteria to become adherent and the change in vaginal pH can disrupt the vaginal / urethral microbiome and allow for bacterial overgrowth. Patient was advised that topical vaginal estrogen replacement will take about 3 months to restore the vaginal pH and may sting/burn initially due to severe dryness, which will improve with ongoing treatment. OK to have sex with any of the topical vaginal estrogen replacement options. We discussed that there have been studies that evaluate use of low-dose intravaginal estrogen cream that shows minimal systemic absorption that is negligible after 3 weeks. There have been no studies indicating increased risk of contributing to breast cancer development or recurrence. She elected to start topical vaginal estrogen cream.  - UTI prophylaxis with a daily low dose antibiotic as per Dr. Ronne Binning.    Will plan for follow up in November  2024 as previously scheduled with Dr. Ronne Binning. Pt verbalized understanding and agreement. All questions were answered.  PLAN Advised the following: 1. Urine culture. 2. Macrobid 100 mg 2x/day x7 days. 3. Continue Nitrofurantoin 50 mg nightly for UTI prophylaxis after completing week of acute antibiotic. 4. Start topical vaginal estrogen cream use 2x/week at bedtime. 5. Return in 4 months (on 11/28/2022) for as previously scheduled with Dr. Ronne Binning.  Orders Placed This Encounter  Procedures   Urine Culture   Urinalysis, Routine w reflex microscopic   BLADDER SCAN AMB NON-IMAGING    It has been explained that the patient is to follow regularly with their PCP in addition to all other providers involved in their care and to follow instructions provided  by these respective offices. Patient advised to contact urology clinic if any urologic-pertaining questions, concerns, new symptoms or problems arise in the interim period.  Patient Instructions  Recommendations regarding UTI prevention / management:  When UTI symptoms occur: Call urology office to request order for urine culture. We recommend waiting for urine culture result prior to use of any antibiotics.  For bladder pain/ burning with urination: Over the counter Pyridium (phenazopyridine) as needed (commonly known under the "AZO" brand). No more than 3 days consecutively at a time due to risk for methemoglobinemia, liver function issues, and bone health damage with long term use of Pyridium.  Routine use for UTI prevention: - Low dose antibiotic daily for UTI prophylaxis. - Topical vaginal estrogen for vaginal atrophy. Adequate fluid intake (>1.5 liters/day) to flush out the urinary tract. - Go to the bathroom to urinate every 4-6 hours while awake to minimize urinary stasis / bacterial overgrowth in the bladder. - Proanthocyanidin (PAC) supplement 36 mg daily; must be soluble (insoluble form of PAC will be ineffective). Recommended brand: Ellura. This is an over-the-counter supplement (often must be found/ purchased online) supplement derived from cranberries with concentrated active component: Proanthocyanidin (PAC) 36 mg daily. Decreases bacterial adherence to bladder lining. Not recommended for patients with interstitial cystitis due to acidity. - D-mannose powder (2 grams daily). This is an over-the-counter supplement which decreases bacterial adherence to bladder lining (it is a sugar that inhibits bacterial adherence to urothelial cells by binding to the pili of enteric bacteria). Take as per manufacturer recommendation. Can be used as an alternative or in addition to the concentrated cranberry supplement. Not recommended for diabetic patients due to sugar content. - Vitamin C  supplement to acidify urine to minimize bacterial growth. Not recommended for patients with interstitial cystitis due to acidity. - Probiotic to maintain healthy vaginal microbiome to suppress bacteria at urethral opening. Brand recommendations: Darrold Junker (includes probiotic & D-mannose ), Feminine Balance (highest concentration of lactobacillus) or Hyperbiotic Pro 15.  Note for patients with diabetes: You may read about D-mannose powder for UTI prevention. That is an over the-counter supplement which decreases bacterial adherence to bladder lining. I would NOT advise that for you as a person with diabetes due to its sugar content.   Electronically signed by:  Donnita Falls, FNP   08/12/22    10:59 AM

## 2022-08-12 ENCOUNTER — Encounter: Payer: Self-pay | Admitting: Urology

## 2022-08-12 ENCOUNTER — Ambulatory Visit: Payer: Medicare Other | Admitting: Urology

## 2022-08-12 VITALS — BP 122/76 | HR 65 | Temp 98.4°F

## 2022-08-12 DIAGNOSIS — R3915 Urgency of urination: Secondary | ICD-10-CM

## 2022-08-12 DIAGNOSIS — R350 Frequency of micturition: Secondary | ICD-10-CM | POA: Diagnosis not present

## 2022-08-12 DIAGNOSIS — N3281 Overactive bladder: Secondary | ICD-10-CM

## 2022-08-12 DIAGNOSIS — N390 Urinary tract infection, site not specified: Secondary | ICD-10-CM

## 2022-08-12 DIAGNOSIS — Z87440 Personal history of urinary (tract) infections: Secondary | ICD-10-CM

## 2022-08-12 DIAGNOSIS — R300 Dysuria: Secondary | ICD-10-CM | POA: Diagnosis not present

## 2022-08-12 LAB — MICROSCOPIC EXAMINATION: WBC, UA: >30 /hpf — AB (ref 0–5)

## 2022-08-12 LAB — URINALYSIS, ROUTINE W REFLEX MICROSCOPIC
Bilirubin, UA: NEGATIVE
Glucose, UA: NEGATIVE
Ketones, UA: NEGATIVE
Nitrite, UA: NEGATIVE
Protein,UA: NEGATIVE
Specific Gravity, UA: 1.01 (ref 1.005–1.030)
Urobilinogen, Ur: 0.2 mg/dL (ref 0.2–1.0)
pH, UA: 6 (ref 5.0–7.5)

## 2022-08-12 LAB — BLADDER SCAN AMB NON-IMAGING: Scan Result: 24

## 2022-08-12 MED ORDER — NITROFURANTOIN MONOHYD MACRO 100 MG PO CAPS: 100.0000 mg | ORAL_CAPSULE | Freq: Two times a day (BID) | ORAL | 0 refills | Status: AC

## 2022-08-12 MED ORDER — ESTRADIOL 0.1 MG/GM VA CREA: TOPICAL_CREAM | VAGINAL | 3 refills | Status: DC

## 2022-08-12 MED ORDER — NITROFURANTOIN MACROCRYSTAL 50 MG PO CAPS: 50.0000 mg | ORAL_CAPSULE | Freq: Every day | ORAL | 3 refills | Status: DC

## 2022-08-12 NOTE — Patient Instructions (Signed)
Recommendations regarding UTI prevention / management:  When UTI symptoms occur: Call urology office to request order for urine culture. We recommend waiting for urine culture result prior to use of any antibiotics.  For bladder pain/ burning with urination: Over the counter Pyridium (phenazopyridine) as needed (commonly known under the "AZO" brand). No more than 3 days consecutively at a time due to risk for methemoglobinemia, liver function issues, and bone health damage with long term use of Pyridium.  Routine use for UTI prevention: - Low dose antibiotic daily for UTI prophylaxis. - Topical vaginal estrogen for vaginal atrophy. Adequate fluid intake (>1.5 liters/day) to flush out the urinary tract. - Go to the bathroom to urinate every 4-6 hours while awake to minimize urinary stasis / bacterial overgrowth in the bladder. - Proanthocyanidin (PAC) supplement 36 mg daily; must be soluble (insoluble form of PAC will be ineffective). Recommended brand: Ellura. This is an over-the-counter supplement (often must be found/ purchased online) supplement derived from cranberries with concentrated active component: Proanthocyanidin (PAC) 36 mg daily. Decreases bacterial adherence to bladder lining. Not recommended for patients with interstitial cystitis due to acidity. - D-mannose powder (2 grams daily). This is an over-the-counter supplement which decreases bacterial adherence to bladder lining (it is a sugar that inhibits bacterial adherence to urothelial cells by binding to the pili of enteric bacteria). Take as per manufacturer recommendation. Can be used as an alternative or in addition to the concentrated cranberry supplement. Not recommended for diabetic patients due to sugar content. - Vitamin C supplement to acidify urine to minimize bacterial growth. Not recommended for patients with interstitial cystitis due to acidity. - Probiotic to maintain healthy vaginal microbiome to suppress bacteria at  urethral opening. Brand recommendations: Uqora (includes probiotic & D-mannose ), Feminine Balance (highest concentration of lactobacillus) or Hyperbiotic Pro 15.  Note for patients with diabetes: You may read about D-mannose powder for UTI prevention. That is an over the-counter supplement which decreases bacterial adherence to bladder lining. I would NOT advise that for you as a person with diabetes due to its sugar content.  

## 2022-08-17 ENCOUNTER — Ambulatory Visit (INDEPENDENT_AMBULATORY_CARE_PROVIDER_SITE_OTHER): Payer: Medicare Other

## 2022-08-17 ENCOUNTER — Telehealth: Payer: Self-pay

## 2022-08-17 DIAGNOSIS — N390 Urinary tract infection, site not specified: Secondary | ICD-10-CM | POA: Diagnosis not present

## 2022-08-17 MED ORDER — GENTAMICIN SULFATE 40 MG/ML IJ SOLN
80.0000 mg | Freq: Once | INTRAMUSCULAR | Status: AC
Start: 2022-08-17 — End: 2022-08-17
  Administered 2022-08-17: 80 mg via INTRAMUSCULAR

## 2022-08-17 NOTE — Telephone Encounter (Signed)
Patient in clinic today for gentamicin inject. Patient states that the pharmacist told patient  estrace cream has hormone in and because she had a total hysterectomy, and lump tomy and currently on anastrozole to prevent cancer from returning she not suppose to have estrace cream. The pharmacy also told her to contact her oncologist about the estrace cream. Patient is aware a message will be sent tom Maralyn Sago on recommendation. Patient voiced understanding

## 2022-08-17 NOTE — Telephone Encounter (Signed)
Patient scheduled for Gentamicin injection per Sarah's instruction. Patient aware to stop macrobid 100mg  and after IM injection she can continue her nightly abx.

## 2022-08-17 NOTE — Progress Notes (Signed)
IM injection  Medication: Gentamicin Dose: 80 Location: left deltoid Lot: [id number]: [id number]  Patient tolerated well, no complications were noted  Performed by: Kennyth Lose, CMA

## 2022-08-17 NOTE — Progress Notes (Signed)
Please let pt know urine culture was positive and sensitivities showed resistance to the antibiotic which was prescribed empirically at the time of her visit on 08/12/2022 (Macrobid) so need to change antibiotic, however options are limited due to pathogen resistance pattern plus patient's antibiotic allergies (including Ceftin, penicillin, and sulfa drugs). I recommend that she come for nurse visits for IM Gentamicin for 3 days in a row starting today (Wednesday 08/17/2022). Please set that up. Thank you.

## 2022-08-17 NOTE — Telephone Encounter (Signed)
Patient states she was told not to take any hormone creams since she has a history of breast cancer.  She also states the pharmacy would not let her get the cream since it has an interaction with her anastrozole rx.  Do you recommend an alternative?

## 2022-08-18 ENCOUNTER — Ambulatory Visit (INDEPENDENT_AMBULATORY_CARE_PROVIDER_SITE_OTHER): Payer: Medicare Other

## 2022-08-18 DIAGNOSIS — N390 Urinary tract infection, site not specified: Secondary | ICD-10-CM

## 2022-08-18 MED ORDER — GENTAMICIN SULFATE 40 MG/ML IJ SOLN
80.0000 mg | Freq: Once | INTRAMUSCULAR | Status: AC
Start: 2022-08-18 — End: 2022-08-18
  Administered 2022-08-18: 80 mg via INTRAMUSCULAR

## 2022-08-18 NOTE — Progress Notes (Signed)
IM injection  Medication: GENTAMICIN Dose: 80 MG Location: RIGHT DELTOID  Patient tolerated well, no complications were noted  Performed by: Liseth Wann LPN

## 2022-08-18 NOTE — Telephone Encounter (Signed)
Patient informed at her NV

## 2022-08-19 ENCOUNTER — Ambulatory Visit: Payer: Medicare Other

## 2022-08-19 DIAGNOSIS — N390 Urinary tract infection, site not specified: Secondary | ICD-10-CM

## 2022-08-19 MED ORDER — GENTAMICIN SULFATE 40 MG/ML IJ SOLN
80.0000 mg | Freq: Once | INTRAMUSCULAR | Status: AC
Start: 2022-08-19 — End: 2022-08-19
  Administered 2022-08-19: 80 mg via INTRAMUSCULAR

## 2022-08-19 NOTE — Progress Notes (Signed)
IM injection  Medication: Gentamicin Dose: 80mg  Location: left deltoid Patient tolerated well, no complications were noted  Performed by:  LPN

## 2022-08-19 NOTE — Addendum Note (Signed)
Addended byGustavus Messing on: 08/19/2022 09:12 AM   Modules accepted: Level of Service

## 2022-08-22 ENCOUNTER — Ambulatory Visit: Payer: Medicare Other

## 2022-09-13 ENCOUNTER — Telehealth: Payer: Self-pay

## 2022-09-13 NOTE — Telephone Encounter (Signed)
Patient is made aware of Sarah response "Please see Maralyn Sago response "Studies show negligible systemic absorption of estrogen related to topical vaginal estrogen cream use, however agree with recommendation to seek clearance from her oncologist & GYN providers prior to starting due to patient concerns." Voiced understanding

## 2022-10-21 ENCOUNTER — Other Ambulatory Visit (HOSPITAL_COMMUNITY): Payer: Self-pay | Admitting: Hematology and Oncology

## 2022-10-21 DIAGNOSIS — Z1231 Encounter for screening mammogram for malignant neoplasm of breast: Secondary | ICD-10-CM

## 2022-10-26 NOTE — H&P (Signed)
TOTAL KNEE ADMISSION H&P  Patient is being admitted for left total knee arthroplasty.  Subjective:  Chief Complaint: Left knee pain.  HPI: Kim Krueger, 82 y.o. female has a history of pain and functional disability in the left knee due to arthritis and has failed non-surgical conservative treatments for greater than 12 weeks to include corticosteriod injections, viscosupplementation injections, use of assistive devices, and activity modification. Onset of symptoms was gradual, starting 5 years ago with gradually worsening course since that time. The patient noted no past surgery on the left knee.  Patient currently rates pain in the left knee at 8 out of 10 with activity. Patient has night pain, worsening of pain with activity and weight bearing, and pain that interferes with activities of daily living. Patient has evidence of periarticular osteophytes and joint space narrowing by imaging studies. There is no active infection.  Patient Active Problem List   Diagnosis Date Noted   Occult blood positive stool 05/19/2022   Iron deficiency anemia 05/19/2022   Amnestic MCI (mild cognitive impairment with memory loss) 04/13/2022   Osteoporosis 12/28/2021   OAB (overactive bladder) 12/13/2021   Hypokalemia 07/14/2021   Prolonged QT interval 07/14/2021   Hypothyroidism 07/14/2021   Sepsis due to undetermined organism 07/14/2021   Pain in joint of right shoulder 03/13/2021   Impingement syndrome of right shoulder region 03/13/2021   Metatarsalgia of right foot 03/13/2021   Genetic testing 03/30/2018   Osteoarthritis of left knee 03/16/2018   History of uterine cancer 03/14/2018   Family history of ovarian cancer    Malignant neoplasm of upper-inner quadrant of left breast in female, estrogen receptor positive 02/12/2018   Pain in left knee 12/13/2017   Family history of colon cancer 02/02/2017   Temporal arteritis 12/17/2015    Past Medical History:  Diagnosis Date   Acute metabolic  encephalopathy 07/14/2021   Amnestic MCI (mild cognitive impairment with memory loss) 04/13/2022   Family history of colon cancer    Family history of ovarian cancer    Genetic testing 03/30/2018   DICER1 VUS identified on the common hereditary cancer panel.  The Hereditary Gene Panel offered by [facility] includes sequencing and/or deletion duplication testing of the following 47 genes: APC, ATM, AXIN2, BARD1, BMPR1A, BRCA1, BRCA2, BRIP1, CDH1, CDK4, CDKN2A (p14ARF), CDKN2A (p16INK4a), CHEK2, CTNNA1, DICER1, EPCAM (Deletion/duplication testing only), GREM1 (promoter region deletion/duplication   History of uterine cancer 03/14/2018   s/p hysterectomy   Hypokalemia 07/14/2021   Hypothyroidism    Impingement syndrome of right shoulder region 03/13/2021   Malignant neoplasm of upper-inner quadrant of left breast in female, estrogen receptor positive 02/12/2018   Metatarsalgia of right foot 03/13/2021   OAB (overactive bladder) 12/13/2021   Osteoarthritis of left knee 03/16/2018   Osteoporosis 12/28/2021   Pain in joint of right shoulder 03/13/2021   Pain in left knee 12/13/2017   Prolonged QT interval 07/14/2021   Sepsis due to undetermined organism 07/14/2021   Temporal arteritis    Urge incontinence    UTI (urinary tract infection) 07/14/2021    Past Surgical History:  Procedure Laterality Date   ABDOMINAL HYSTERECTOMY     ovaries removed as well   APPENDECTOMY     appendix ruptured as child, still have part of appendix   ARTERY BIOPSY Right 10/19/2015   Procedure: RIGHT TEMPORAL ARTERY BIOPSY;  Surgeon: Jimmye Norman, MD;  Location: [facility];  Service: General;  Laterality: Right;   BREAST LUMPECTOMY WITH RADIOACTIVE SEED LOCALIZATION Left 02/28/2018  Procedure: LEFT BREAST LUMPECTOMY WITH RADIOACTIVE SEED LOCALIZATION;  Surgeon: Glenna Fellows, MD;  Location: [facility];  Service: General;  Laterality: Left;   HERNIA REPAIR  03/2016   umbilical    INSERTION OF MESH N/A 03/28/2016   Procedure: INSERTION OF MESH;  Surgeon: Jimmye Norman, MD;  Location: [facility];  Service: General;  Laterality: N/A;   UMBILICAL HERNIA REPAIR N/A 03/28/2016   Procedure: LAPAROSCOPIC UMBILICAL HERNIA;  Surgeon: Jimmye Norman, MD;  Location: [facility] OR;  Service: General;  Laterality: N/A;    Prior to Admission medications   Medication Sig Start Date End Date Taking? Authorizing Provider  acetaminophen (TYLENOL) 500 MG tablet Take 500 mg by mouth every 6 (six) hours as needed. Takes every 6 hours as needed    [provider]  anastrozole (ARIMIDEX) 1 MG tablet TAKE 1 TABLET(1 MG) BY MOUTH DAILY 04/12/22   Serena Croissant, MD  estradiol (ESTRACE) 0.1 MG/GM vaginal cream Discard plastic applicator. Insert a blueberry size amount (approximately 1 gram) of cream on fingertip inside vagina at bedtime every night for 1 week then 2 nights per week for long term use. 08/12/22   Deliah Boston, Eleonore Chiquito, FNP  ibuprofen (ADVIL) 200 MG tablet Take 200 mg by mouth. 3 motrin every 6 hours as needed    [provider]  levothyroxine (SYNTHROID) 88 MCG tablet Take 1 tablet by mouth daily.    [provider]  memantine (NAMENDA) 5 MG tablet Take 1 tablet twice a day 07/25/22   Marcos Eke, PA-C  nitrofurantoin (MACRODANTIN) 50 MG capsule Take 1 capsule (50 mg total) by mouth at bedtime. 08/12/22   Donnita Falls, FNP    Allergies  Allergen Reactions   Ceftin [Cefuroxime Axetil] Nausea Only    Caused sever yeast infection   Hydrocodone    Other    Solifenacin    Sulfa Antibiotics Other (See Comments)    Unknown childhood reaction after surgery    Tramadol Other (See Comments)    Cause pain to worsen    Penicillins Rash    Has patient had a PCN reaction causing immediate rash, facial/tongue/throat swelling, SOB or lightheadedness with hypotension: Yes Has patient had a PCN reaction causing severe rash involving mucus membranes or skin necrosis: No Has patient had a  PCN reaction that required hospitalization No Has patient had a PCN reaction occurring within the last 10 years: No If all of the above answers are "NO", then may proceed with Cephalosporin use.     Social History   Socioeconomic History   Marital status: Married    Spouse name: Not on file   Number of children: 1   Years of education: 14   Highest education level: Some college, no degree  Occupational History   Occupation: Retired    Comment: Admin  Tobacco Use   Smoking status: Never   Smokeless tobacco: Never  Vaping Use   Vaping status: Never Used  Substance and Sexual Activity   Alcohol use: No   Drug use: No   Sexual activity: Not Currently    Birth control/protection: Surgical  Other Topics Concern   Not on file  Social History Narrative   Right    Two story home   Lives with husband   Social Determinants of Health   Financial Resource Strain: Not on file  Food Insecurity: Not on file  Transportation Needs: No Transportation Needs (03/14/2018)   PRAPARE - Administrator, Civil Service (Medical):  No    Lack of Transportation (Non-Medical): No  Physical Activity: Not on file  Stress: Not on file  Social Connections: Not on file  Intimate Partner Violence: Not on file    Tobacco Use: Low Risk  (08/12/2022)   Patient History    Smoking Tobacco Use: Never    Smokeless Tobacco Use: Never    Passive Exposure: Not on file   Social History   Substance and Sexual Activity  Alcohol Use No    Family History  Problem Relation Age of Onset   Colon cancer Mother 27       d. 54   Heart disease Father    Dementia Maternal Grandmother    Alzheimer's disease Maternal Grandmother    Dementia Maternal Uncle    Alzheimer's disease Maternal Uncle    Ovarian cancer Cousin        died at 30; pat first cousin   Alzheimer's disease Cousin    Dementia Cousin 104       mat first cousin    ROS  Objective:  Physical Exam: - Well-developed female, alert  and oriented, no apparent distress.  - Right knee No swelling, range of motion 0-130 degrees, no tenderness or instability.  - Left knee No effusion, range of motion approximately 10-95 degrees with marked crepitus, tenderness medially and laterally, no instability noted. Significant antalgic gait pattern on the left.  - Left hip Normal range of motion, no discomfort.   IMAGING:  Radiographs of the left knee (AP and lateral views) from June 2024 demonstrate bone-on-bone arthritis in all three compartments.  Assessment/Plan:  End stage arthritis, left knee   The patient history, physical examination, clinical judgment of the provider and imaging studies are consistent with end stage degenerative joint disease of the left knee and total knee arthroplasty is deemed medically necessary. The treatment options including medical management, injection therapy arthroscopy and arthroplasty were discussed at length. The risks and benefits of total knee arthroplasty were presented and reviewed. The risks due to aseptic loosening, infection, stiffness, patella tracking problems, thromboembolic complications and other imponderables were discussed. The patient acknowledged the explanation, agreed to proceed with the plan and consent was signed. Patient is being admitted for inpatient treatment for surgery, pain control, PT, OT, prophylactic antibiotics, VTE prophylaxis, progressive ambulation and ADLs and discharge planning. The patient is planning to be discharged home.   Patient's anticipated LOS is less than 2 midnights, meeting these requirements: - Younger than 39 - Lives within 1 hour of care - Has a competent adult at home to recover with post-op recover - NO history of  - Chronic pain requiring opiods  - Diabetes  - Coronary Artery Disease  - Heart failure  - Heart attack  - Stroke  - DVT/VTE  - Cardiac arrhythmia  - Respiratory Failure/COPD  - Renal failure  - Anemia  - Advanced Liver  disease  Therapy Plans: [facility] [place] Disposition: Home with husband Planned DVT Prophylaxis: Aspirin 81mg  BID DME Needed: None PCP: Dr. Wylene Simmer (clearance received) TXA: IV Allergies: hydrocodone, PCN (childhood allergy), Sulfa (childhood allergy), tramadol & hydrocodone (increased temporal arteritis pain) Anesthesia Concerns: None BMI: 26.5 Last HgbA1c: Not diabetic Pharmacy: [facility] on [address]  Elnita Maxwell: -Requests gabapentin as this worked well for her previously -Hx mild cognitive impairment  - Patient was instructed on what medications to stop prior to surgery. - Follow-up visit in 2 weeks with Dr. Lequita Halt - Begin physical therapy following surgery - Pre-operative lab work as pre-surgical  testing - Prescriptions will be provided in hospital at time of discharge  Weston Brass, PA-C Orthopedic Surgery [facility] [place]

## 2022-11-08 ENCOUNTER — Telehealth: Payer: Self-pay | Admitting: Urology

## 2022-11-08 NOTE — Telephone Encounter (Signed)
Patient wanted to let you know that she is doing great since she got the injections, she will be cancelling her November appointment for not because she is having total knee replacement surgery if she needs you she will give you a call.

## 2022-11-08 NOTE — Progress Notes (Addendum)
Anesthesia Review:  PCP: Guerry Bruin  Cardiologist : none  Neuro- Alphonsa Overall LOV 07/25/22  Chest x-ray : EKG : Echo : Stress test: Cardiac Cath :  Activity level: can do a flight of stairs without difficulty  Sleep Study/ CPAP : none  Fasting Blood Sugar :      / Checks Blood Sugar -- times a day:   Blood Thinner/ Instructions /Last Dose: ASA / Instructions/ Last Dose :   Mammogram done 11/15/22.  Hx of left breast cancer.  Appt with Dr Pamelia Hoit on 11/16/22 after preop appt.     PT reports at preop that had UTI 2 years ago and husband found on floor at home.  Was in hospital for 4 days after that .  Had reaction to drug.  PT states she has some short term memory issues at times and takes [place].

## 2022-11-11 ENCOUNTER — Ambulatory Visit (HOSPITAL_COMMUNITY)
Admission: RE | Admit: 2022-11-11 | Discharge: 2022-11-11 | Disposition: A | Discharge status: Home / Self Care | Payer: Medicare Other | Source: Ambulatory Visit | Attending: Hematology and Oncology

## 2022-11-11 ENCOUNTER — Encounter (HOSPITAL_COMMUNITY): Payer: Self-pay

## 2022-11-11 DIAGNOSIS — Z1231 Encounter for screening mammogram for malignant neoplasm of breast: Secondary | ICD-10-CM | POA: Diagnosis present

## 2022-11-14 NOTE — Patient Instructions (Signed)
SURGICAL WAITING ROOM VISITATION  Patients having surgery or a procedure may have no more than 2 support people in the waiting area - these visitors may rotate.    Children under the age of 85 must have an adult with them who is not the patient.  Due to an increase in RSV and influenza rates and associated hospitalizations, children ages 34 and under may not visit patients in [facility] hospitals.  If the patient needs to stay at the hospital during part of their recovery, the visitor guidelines for inpatient rooms apply. Pre-op nurse will coordinate an appropriate time for 1 support person to accompany patient in pre-op.  This support person may not rotate.    Please refer to the [facility] website for the visitor guidelines for Inpatients (after your surgery is over and you are in a regular room).       Your procedure is scheduled on:  01/20/2022    Report to [facility] Main Entrance    Report to admitting at   [phone]   Call this number if you have problems the morning of surgery [phone]   Do not eat food :After Midnight.   After Midnight you may have the following liquids until _ 0415_____ AM  DAY OF SURGERY  Water Non-Citrus Juices (without pulp, NO RED-Apple, White grape, White cranberry) Black Coffee (NO MILK/CREAM OR CREAMERS, sugar ok)  Clear Tea (NO MILK/CREAM OR CREAMERS, sugar ok) regular and decaf                             Plain Jell-O (NO RED)                                           Fruit ices (not with fruit pulp, NO RED)                                     Popsicles (NO RED)                                                               Sports drinks like Gatorade (NO RED)                   The day of surgery:  Drink ONE (1) Pre-Surgery Clear Ensure or G2 at  0415AM  ( have completed by ) the morning of surgery. Drink in one sitting. Do not sip.  This drink was given to you during your hospital  pre-op appointment visit. Nothing else to drink  after completing the  Pre-Surgery Clear Ensure or G2.          If you have questions, please contact your surgeon's office.       Oral Hygiene is also important to reduce your risk of infection.                                    Remember - BRUSH YOUR TEETH THE MORNING OF SURGERY WITH YOUR  REGULAR TOOTHPASTE  DENTURES WILL BE REMOVED PRIOR TO SURGERY PLEASE DO NOT APPLY "Poly grip" OR ADHESIVES!!!   Do NOT smoke after Midnight   Stop all vitamins and herbal supplements 7 days before surgery.   Take these medicines the morning of surgery with A SIP OF WATER:  arimidex, synthroid, namenda   DO NOT TAKE ANY ORAL DIABETIC MEDICATIONS DAY OF YOUR SURGERY  Bring CPAP mask and tubing day of surgery.                              You may not have any metal on your body including hair pins, jewelry, and body piercing             Do not wear make-up, lotions, powders, perfumes/cologne, or deodorant  Do not wear nail polish including gel and S&S, artificial/acrylic nails, or any other type of covering on natural nails including finger and toenails. If you have artificial nails, gel coating, etc. that needs to be removed by a nail salon please have this removed prior to surgery or surgery may need to be canceled/ delayed if the surgeon/ anesthesia feels like they are unable to be safely monitored.   Do not shave  48 hours prior to surgery.               Men may shave face and neck.   Do not bring valuables to the hospital. [facility] IS NOT             RESPONSIBLE   FOR VALUABLES.   Contacts, glasses, dentures or bridgework may not be worn into surgery.   Bring small overnight bag day of surgery.   DO NOT BRING YOUR HOME MEDICATIONS TO THE HOSPITAL. PHARMACY WILL DISPENSE MEDICATIONS LISTED ON YOUR MEDICATION LIST TO YOU DURING YOUR ADMISSION IN THE HOSPITAL!    Patients discharged on the day of surgery will not be allowed to drive home.  Someone NEEDS to stay with you for the first 24  hours after anesthesia.   Special Instructions: Bring a copy of your healthcare power of attorney and living will documents the day of surgery if you haven't scanned them before.              Please read over the following fact sheets you were given: IF YOU HAVE QUESTIONS ABOUT YOUR PRE-OP INSTRUCTIONS PLEASE CALL [phone]   If you received a COVID test during your pre-op visit  it is requested that you wear a mask when out in public, stay away from anyone that may not be feeling well and notify your surgeon if you develop symptoms. If you test positive for Covid or have been in contact with anyone that has tested positive in the last 10 days please notify you surgeon.      Pre-operative 5 CHG Bath Instructions   You can play a key role in reducing the risk of infection after surgery. Your skin needs to be as free of germs as possible. You can reduce the number of germs on your skin by washing with CHG (chlorhexidine gluconate) soap before surgery. CHG is an antiseptic soap that kills germs and continues to kill germs even after washing.   DO NOT use if you have an allergy to chlorhexidine/CHG or antibacterial soaps. If your skin becomes reddened or irritated, stop using the CHG and notify one of our RNs at [phone].   Please shower with the CHG soap  starting 4 days before surgery using the following schedule:     Please keep in mind the following:  DO NOT shave, including legs and underarms, starting the day of your first shower.   You may shave your face at any point before/day of surgery.  Place clean sheets on your bed the day you start using CHG soap. Use a clean washcloth (not used since being washed) for each shower. DO NOT sleep with pets once you start using the CHG.   CHG Shower Instructions:  If you choose to wash your hair and private area, wash first with your normal shampoo/soap.  After you use shampoo/soap, rinse your hair and body thoroughly to remove  shampoo/soap residue.  Turn the water OFF and apply about 3 tablespoons (45 ml) of CHG soap to a CLEAN washcloth.  Apply CHG soap ONLY FROM YOUR NECK DOWN TO YOUR TOES (washing for 3-5 minutes)  DO NOT use CHG soap on face, private areas, open wounds, or sores.  Pay special attention to the area where your surgery is being performed.  If you are having back surgery, having someone wash your back for you may be helpful. Wait 2 minutes after CHG soap is applied, then you may rinse off the CHG soap.  Pat dry with a clean towel  Put on clean clothes/pajamas   If you choose to wear lotion, please use ONLY the CHG-compatible lotions on the back of this paper.     Additional instructions for the day of surgery: DO NOT APPLY any lotions, deodorants, cologne, or perfumes.   Put on clean/comfortable clothes.  Brush your teeth.  Ask your nurse before applying any prescription medications to the skin.      CHG Compatible Lotions   Aveeno Moisturizing lotion  Cetaphil Moisturizing Cream  Cetaphil Moisturizing Lotion  Clairol Herbal Essence Moisturizing Lotion, Dry Skin  Clairol Herbal Essence Moisturizing Lotion, Extra Dry Skin  Clairol Herbal Essence Moisturizing Lotion, Normal Skin  Curel Age Defying Therapeutic Moisturizing Lotion with Alpha Hydroxy  Curel Extreme Care Body Lotion  Curel Soothing Hands Moisturizing Hand Lotion  Curel Therapeutic Moisturizing Cream, Fragrance-Free  Curel Therapeutic Moisturizing Lotion, Fragrance-Free  Curel Therapeutic Moisturizing Lotion, Original Formula  Eucerin Daily Replenishing Lotion  Eucerin Dry Skin Therapy Plus Alpha Hydroxy Crme  Eucerin Dry Skin Therapy Plus Alpha Hydroxy Lotion  Eucerin Original Crme  Eucerin Original Lotion  Eucerin Plus Crme Eucerin Plus Lotion  Eucerin TriLipid Replenishing Lotion  Keri Anti-Bacterial Hand Lotion  Keri Deep Conditioning Original Lotion Dry Skin Formula Softly Scented  Keri Deep Conditioning  Original Lotion, Fragrance Free Sensitive Skin Formula  Keri Lotion Fast Absorbing Fragrance Free Sensitive Skin Formula  Keri Lotion Fast Absorbing Softly Scented Dry Skin Formula  Keri Original Lotion  Keri Skin Renewal Lotion Keri Silky Smooth Lotion  Keri Silky Smooth Sensitive Skin Lotion  Nivea Body Creamy Conditioning Oil  Nivea Body Extra Enriched Teacher, adult education Moisturizing Lotion Nivea Crme  Nivea Skin Firming Lotion  NutraDerm 30 Skin Lotion  NutraDerm Skin Lotion  NutraDerm Therapeutic Skin Cream  NutraDerm Therapeutic Skin Lotion  ProShield Protective Hand Cream  Provon moisturizing lotion

## 2022-11-15 ENCOUNTER — Other Ambulatory Visit (HOSPITAL_COMMUNITY): Payer: Self-pay | Admitting: Hematology and Oncology

## 2022-11-15 ENCOUNTER — Encounter (HOSPITAL_COMMUNITY): Payer: Self-pay | Admitting: Hematology and Oncology

## 2022-11-15 DIAGNOSIS — R928 Other abnormal and inconclusive findings on diagnostic imaging of breast: Secondary | ICD-10-CM

## 2022-11-16 ENCOUNTER — Encounter (HOSPITAL_COMMUNITY): Payer: Self-pay

## 2022-11-16 ENCOUNTER — Encounter (HOSPITAL_COMMUNITY)
Admission: RE | Admit: 2022-11-16 | Discharge: 2022-11-16 | Disposition: A | Discharge status: Home / Self Care | Payer: Medicare Other | Source: Ambulatory Visit | Attending: Orthopedic Surgery | Admitting: Orthopedic Surgery

## 2022-11-16 ENCOUNTER — Inpatient Hospital Stay: Payer: Medicare Other | Attending: Hematology and Oncology | Admitting: Hematology and Oncology

## 2022-11-16 ENCOUNTER — Other Ambulatory Visit: Payer: Self-pay

## 2022-11-16 VITALS — BP 173/66 | HR 77 | Temp 97.4°F | Resp 18 | Ht 63.5 in | Wt 151.7 lb

## 2022-11-16 VITALS — BP 173/84 | HR 65 | Temp 98.2°F | Resp 16 | Ht 63.5 in | Wt 147.0 lb

## 2022-11-16 DIAGNOSIS — C50212 Malignant neoplasm of upper-inner quadrant of left female breast: Secondary | ICD-10-CM | POA: Diagnosis present

## 2022-11-16 DIAGNOSIS — M810 Age-related osteoporosis without current pathological fracture: Secondary | ICD-10-CM | POA: Insufficient documentation

## 2022-11-16 DIAGNOSIS — Z170 Estrogen receptor positive status [ER+]: Secondary | ICD-10-CM

## 2022-11-16 DIAGNOSIS — Z79811 Long term (current) use of aromatase inhibitors: Secondary | ICD-10-CM | POA: Insufficient documentation

## 2022-11-16 DIAGNOSIS — Z01818 Encounter for other preprocedural examination: Secondary | ICD-10-CM | POA: Diagnosis present

## 2022-11-16 LAB — BASIC METABOLIC PANEL
Anion gap: 8 (ref 5–15)
BUN: 22 mg/dL (ref 8–23)
CO2: 23 mmol/L (ref 22–32)
Calcium: 9.7 mg/dL (ref 8.9–10.3)
Chloride: 110 mmol/L (ref 98–111)
Creatinine, Ser: 0.63 mg/dL (ref 0.44–1.00)
GFR, Estimated: >60 mL/min (ref >60)
Glucose, Bld: 93 mg/dL (ref 70–99)
Potassium: 4.4 mmol/L (ref 3.5–5.1)
Sodium: 141 mmol/L (ref 135–145)

## 2022-11-16 LAB — CBC
HCT: 38 % (ref 36.0–46.0)
Hemoglobin: 11.4 g/dL — ABNORMAL LOW (ref 12.0–15.0)
MCH: 27.2 pg (ref 26.0–34.0)
MCHC: 30 g/dL (ref 30.0–36.0)
MCV: 90.7 fL (ref 80.0–100.0)
Platelets: 271 10*3/uL (ref 150–400)
RBC: 4.19 MIL/uL (ref 3.87–5.11)
RDW: 14.9 % (ref 11.5–15.5)
WBC: 5 10*3/uL (ref 4.0–10.5)
nRBC: 0 % (ref 0.0–0.2)

## 2022-11-16 LAB — SURGICAL PCR SCREEN
MRSA, PCR: POSITIVE — AB
Staphylococcus aureus: POSITIVE — AB

## 2022-11-16 NOTE — Progress Notes (Signed)
Patient Care Team: Tisovec, Adelfa Koh, MD as PCP - General (Internal Medicine) Althea Charon, MD as Radiologist (Radiology) Lynden Ang, NP as Nurse Practitioner (Obstetrics and Gynecology) Serena Croissant, MD as Consulting Physician (Hematology and Oncology) Glenna Fellows, MD (Inactive) as Consulting Physician (General Surgery) Elwyn Reach (Neurology)  DIAGNOSIS:  Encounter Diagnosis  Name Primary?   Malignant neoplasm of upper-inner quadrant of left breast in female, estrogen receptor positive Yes    SUMMARY OF ONCOLOGIC HISTORY: Oncology History  Malignant neoplasm of upper-inner quadrant of left breast in female, estrogen receptor positive  01/15/2018 Initial Diagnosis   Screening detected left breast calcifications 0.5 cm 9 o'clock position 7 cm from the nipple.  Biopsy revealed IDC grade 1 through 2 with low-grade DCIS   02/28/2018 Surgery   Left lumpectomy: No residual in situ or invasive cancer seen.   03/27/2018 Genetic Testing   DICER1 VUS identified on the common hereditary cancer panel.  The Hereditary Gene Panel offered by [facility] includes sequencing and/or deletion duplication testing of the following 47 genes: APC, ATM, AXIN2, BARD1, BMPR1A, BRCA1, BRCA2, BRIP1, CDH1, CDK4, CDKN2A (p14ARF), CDKN2A (p16INK4a), CHEK2, CTNNA1, DICER1, EPCAM (Deletion/duplication testing only), GREM1 (promoter region deletion/duplication testing only), KIT, MEN1, MLH1, MSH2, MSH3, MSH6, MUTYH, NBN, NF1, NHTL1, PALB2, PDGFRA, PMS2, POLD1, POLE, PTEN, RAD50, RAD51C, RAD51D, SDHB, SDHC, SDHD, SMAD4, SMARCA4. STK11, TP53, TSC1, TSC2, and VHL.  The following genes were evaluated for sequence changes only: SDHA and HOXB13 c.251G>A variant only. The report date is 03/27/2018.  UPDATE: DICER1 c.1468C>T (p.Arg490Cys) has been reclassified to Likely Benign.  The amended report date is October 05, 2020.   06/2019 -  Anti-estrogen oral therapy   Anastrozole 1mg  daily      CHIEF COMPLIANT: Surveillance of breast cancer  History of Present Illness   The patient, with a history of breast cancer, presents for a follow-up visit after a recent mammogram. The mammogram showed calcium deposits in the left breast. The patient reports that she is scheduled for knee surgery and will be in rehab for a couple of months. The patient also mentions a history of urinary tract infections, which have been under control for the past four months. The patient has been diagnosed with osteoporosis and is considering starting medication for bone strengthening after recovery from the knee surgery. The patient also reports a slight cognitive impairment, which has been stable with no significant progression. The patient is currently on memantine for this condition.         ALLERGIES:  is allergic to ceftin [cefuroxime axetil], hydrocodone, solifenacin, sulfa antibiotics, tramadol, and penicillins.  MEDICATIONS:  Current Outpatient Medications  Medication Sig Dispense Refill   acetaminophen (TYLENOL) 500 MG tablet Take 1,000 mg by mouth 2 (two) times daily.     anastrozole (ARIMIDEX) 1 MG tablet TAKE 1 TABLET(1 MG) BY MOUTH DAILY 90 tablet 3   ASPERCREME LIDOCAINE EX Apply 1 Application topically daily as needed (pain).     ibuprofen (ADVIL) 200 MG tablet Take 600 mg by mouth 2 (two) times daily.     levothyroxine (SYNTHROID) 88 MCG tablet Take 1 tablet by mouth daily.     memantine (NAMENDA) 5 MG tablet Take 1 tablet twice a day 180 tablet 3   nitrofurantoin (MACRODANTIN) 50 MG capsule Take 1 capsule (50 mg total) by mouth at bedtime. 90 capsule 3   No current facility-administered medications for this visit.    PHYSICAL EXAMINATION: ECOG PERFORMANCE STATUS: 1 - Symptomatic but completely ambulatory  Vitals:   11/16/22 1124  BP: (!) 173/66  Pulse: 77  Resp: 18  Temp: (!) 97.4 F (36.3 C)  SpO2: 98%   Filed Weights   11/16/22 1124  Weight: 151 lb 11.2 oz (68.8 kg)     Physical Exam          (exam performed in the presence of a chaperone)  LABORATORY DATA:  I have reviewed the data as listed    Latest Ref Rng & Units 11/16/2022   10:30 AM 07/15/2021    4:16 AM 07/14/2021    4:38 AM  CMP  Glucose 70 - 99 mg/dL 93  93  409   BUN 8 - 23 mg/dL 22  9  10    Creatinine 0.44 - 1.00 mg/dL 8.11  9.14  7.82   Sodium 135 - 145 mmol/L 141  137  135   Potassium 3.5 - 5.1 mmol/L 4.4  3.3  3.0   Chloride 98 - 111 mmol/L 110  105  103   CO2 22 - 32 mmol/L 23  24  23    Calcium 8.9 - 10.3 mg/dL 9.7  7.9  7.9   Total Protein 6.5 - 8.1 g/dL   5.5   Total Bilirubin 0.3 - 1.2 mg/dL   0.7   Alkaline Phos 38 - 126 U/L   57   AST 15 - 41 U/L   13   ALT 0 - 44 U/L   14     Lab Results  Component Value Date   WBC 5.0 11/16/2022   HGB 11.4 (L) 11/16/2022   HCT 38.0 11/16/2022   MCV 90.7 11/16/2022   PLT 271 11/16/2022   NEUTROABS 12.1 (H) 07/14/2021    ASSESSMENT & PLAN:  Malignant neoplasm of upper-inner quadrant of left breast in female, estrogen receptor positive 01/15/2018: Screening detected left breast calcifications 0.5 cm 9 o'clock position 7 cm from the nipple.  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   Treatment plan:  Anastrozole 1 mg daily delayed due to family issues and stress.  Started 07/11/2019   Anastrozole toxicities: Denies any adverse effects to anastrozole therapy. Frequent UTIs: Patient is following with urology.  I discussed with her about avoiding vaginal dryness by using coconut oil. Bone density 12/21/2021: T-score -2.5: Osteoporosis: Recommended bisphosphonate therapy with calcium vitamin D and weightbearing exercises   Breast cancer surveillance: 1.  Breast exam 11/16/2022: Benign 2. mammogram 11/11/2022:   Benign breast density category B   Return to clinic in 1 year for  follow-up ------------------------------------- Assessment and Plan    Breast Calcifications Screening mammogram revealed calcifications in the left breast. Discussed that calcifications are often benign but can indicate a disturbance in the breast tissue. No immediate concern, but further diagnostic imaging is needed. -Schedule diagnostic mammogram in approximately two months, after recovery from upcoming knee surgery.  Osteoporosis Bone density scan revealed osteoporosis with a T-score of -2.5. Discussed potential benefit of bone-strengthening medications such as Fosamax or Boniva. -Consider discussion of bone-strengthening medications with primary care provider or at next visit, after recovery from knee surgery.  Breast Cancer History Patient has been on Anastrozole since June 2021 for a total of 3.5 years. No current issues reported. -Continue Anastrozole with plan to complete a total of 5 years of therapy. -Schedule follow-up visit in one year.  Cognitive Impairment Patient reports a diagnosis of slight cognitive impairment and  is currently taking Memantine. No significant changes in cognitive function reported. -Continue current management and monitor for changes.  General Health Maintenance -Schedule next annual visit.          No orders of the defined types were placed in this encounter.  The patient has a good understanding of the overall plan. she agrees with it. she will call with any problems that may develop before the next visit here. Total time spent: 30 mins including face to face time and time spent for planning, charting and co-ordination of care   Tamsen Meek, MD 11/16/22

## 2022-11-16 NOTE — Assessment & Plan Note (Signed)
01/15/2018: Screening detected left breast calcifications 0.5 cm 9 o'clock position 7 cm from the nipple.  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   Treatment plan:  Anastrozole 1 mg daily delayed due to family issues and stress.  Started 07/11/2019   Anastrozole toxicities: Denies any adverse effects to anastrozole therapy. Frequent UTIs: Patient is following with urology.  I discussed with her about avoiding vaginal dryness by using coconut oil. Bone density 12/21/2021: T-score -2.5: Osteoporosis: Recommended bisphosphonate therapy with calcium vitamin D and weightbearing exercises   Breast cancer surveillance: 1.  Breast exam 11/16/2022: Benign 2. mammogram 11/11/2022:   Benign breast density category B   Return to clinic in 1 year for follow-up

## 2022-11-18 NOTE — Anesthesia Preprocedure Evaluation (Signed)
Anesthesia Evaluation  Patient identified by MRN, date of birth, ID band Patient awake    Reviewed: Allergy & Precautions, H&P , NPO status , Patient's Chart, lab work & pertinent test results  Airway Mallampati: III  TM Distance: >3 FB Neck ROM: Full    Dental no notable dental hx. (+) Teeth Intact, Dental Advisory Given   Pulmonary neg pulmonary ROS   Pulmonary exam normal breath sounds clear to auscultation       Cardiovascular Exercise Tolerance: Good negative cardio ROS  Rhythm:Regular Rate:Normal     Neuro/Psych negative neurological ROS  negative psych ROS   GI/Hepatic negative GI ROS, Neg liver ROS,,,  Endo/Other  Hypothyroidism    Renal/GU negative Renal ROS  negative genitourinary   Musculoskeletal  (+) Arthritis , Osteoarthritis,    Abdominal   Peds  Hematology  (+) Blood dyscrasia, anemia   Anesthesia Other Findings   Reproductive/Obstetrics negative OB ROS                             Anesthesia Physical Anesthesia Plan  ASA: 2  Anesthesia Plan: Spinal   Post-op Pain Management: Regional block* and Ofirmev IV (intra-op)*   Induction: Intravenous  PONV Risk Score and Plan: 3 and Ondansetron, Dexamethasone and Propofol infusion  Airway Management Planned: Natural Airway and Simple Face Mask  Additional Equipment:   Intra-op Plan:   Post-operative Plan:   Informed Consent: I have reviewed the patients History and Physical, chart, labs and discussed the procedure including the risks, benefits and alternatives for the proposed anesthesia with the patient or authorized representative who has indicated his/her understanding and acceptance.     Dental advisory given  Plan Discussed with: CRNA  Anesthesia Plan Comments:        Anesthesia Quick Evaluation

## 2022-11-21 ENCOUNTER — Other Ambulatory Visit: Payer: Self-pay

## 2022-11-21 ENCOUNTER — Ambulatory Visit (HOSPITAL_COMMUNITY): Payer: Medicare Other | Admitting: Physician Assistant

## 2022-11-21 ENCOUNTER — Encounter (HOSPITAL_COMMUNITY): Payer: Self-pay | Admitting: Orthopedic Surgery

## 2022-11-21 ENCOUNTER — Encounter (HOSPITAL_COMMUNITY)
Admission: RE | Disposition: A | Discharge status: Home / Self Care | Payer: Self-pay | Source: Home / Self Care | Attending: Orthopedic Surgery

## 2022-11-21 ENCOUNTER — Ambulatory Visit (HOSPITAL_COMMUNITY): Payer: Medicare Other | Admitting: Anesthesiology

## 2022-11-21 ENCOUNTER — Observation Stay (HOSPITAL_COMMUNITY)
Admission: RE | Admit: 2022-11-21 | Discharge: 2022-11-22 | Disposition: A | Discharge status: Home / Self Care | Payer: Medicare Other | Attending: Orthopedic Surgery | Admitting: Orthopedic Surgery

## 2022-11-21 DIAGNOSIS — E039 Hypothyroidism, unspecified: Secondary | ICD-10-CM | POA: Diagnosis not present

## 2022-11-21 DIAGNOSIS — Z7901 Long term (current) use of anticoagulants: Secondary | ICD-10-CM | POA: Diagnosis not present

## 2022-11-21 DIAGNOSIS — M1712 Unilateral primary osteoarthritis, left knee: Secondary | ICD-10-CM

## 2022-11-21 DIAGNOSIS — Z01818 Encounter for other preprocedural examination: Secondary | ICD-10-CM

## 2022-11-21 HISTORY — PX: TOTAL KNEE ARTHROPLASTY: SHX125

## 2022-11-21 SURGERY — ARTHROPLASTY, KNEE, TOTAL
Anesthesia: Spinal | Site: Knee | Laterality: Left

## 2022-11-21 MED ORDER — METOCLOPRAMIDE HCL 5 MG PO TABS: 5.0000 mg | ORAL_TABLET | Freq: Three times a day (TID) | ORAL | Status: DC | PRN

## 2022-11-21 MED ORDER — STERILE WATER FOR IRRIGATION IR SOLN
Status: DC | PRN
  Administered 2022-11-21: 1000 mL

## 2022-11-21 MED ORDER — GABAPENTIN 300 MG PO CAPS
300.0000 mg | ORAL_CAPSULE | Freq: Three times a day (TID) | ORAL | Status: DC
  Administered 2022-11-21 – 2022-11-22 (×3): 300 mg via ORAL
  Filled 2022-11-21 (×3): qty 1

## 2022-11-21 MED ORDER — BUPIVACAINE LIPOSOME 1.3 % IJ SUSP
INTRAMUSCULAR | Status: DC | PRN
  Administered 2022-11-21: 20 mL

## 2022-11-21 MED ORDER — POLYETHYLENE GLYCOL 3350 17 G PO PACK: 17.0000 g | PACK | Freq: Every day | ORAL | Status: DC | PRN

## 2022-11-21 MED ORDER — PHENYLEPHRINE HCL-NACL 20-0.9 MG/250ML-% IV SOLN
INTRAVENOUS | Status: AC
  Filled 2022-11-21: qty 250

## 2022-11-21 MED ORDER — MIDAZOLAM HCL 2 MG/2ML IJ SOLN
INTRAMUSCULAR | Status: AC
  Filled 2022-11-21: qty 2

## 2022-11-21 MED ORDER — FENTANYL CITRATE (PF) 100 MCG/2ML IJ SOLN
INTRAMUSCULAR | Status: AC
  Filled 2022-11-21: qty 2

## 2022-11-21 MED ORDER — PROPOFOL 1000 MG/100ML IV EMUL
INTRAVENOUS | Status: AC
  Filled 2022-11-21: qty 200

## 2022-11-21 MED ORDER — OXYCODONE HCL 5 MG PO TABS: 10.0000 mg | ORAL_TABLET | ORAL | Status: DC | PRN

## 2022-11-21 MED ORDER — EPHEDRINE SULFATE (PRESSORS) 50 MG/ML IJ SOLN
INTRAMUSCULAR | Status: DC | PRN
  Administered 2022-11-21: 10 mg via INTRAVENOUS

## 2022-11-21 MED ORDER — METOCLOPRAMIDE HCL 5 MG/ML IJ SOLN: 5.0000 mg | Freq: Three times a day (TID) | INTRAMUSCULAR | Status: DC | PRN

## 2022-11-21 MED ORDER — MUPIROCIN 2 % EX OINT: 1.0000 | TOPICAL_OINTMENT | Freq: Two times a day (BID) | CUTANEOUS | 0 refills | Status: AC

## 2022-11-21 MED ORDER — DEXAMETHASONE SODIUM PHOSPHATE 10 MG/ML IJ SOLN
10.0000 mg | Freq: Once | INTRAMUSCULAR | Status: AC
  Administered 2022-11-22: 10 mg via INTRAVENOUS
  Filled 2022-11-21: qty 1

## 2022-11-21 MED ORDER — ASPIRIN 81 MG PO CHEW
81.0000 mg | CHEWABLE_TABLET | Freq: Two times a day (BID) | ORAL | Status: DC
  Administered 2022-11-22: 81 mg via ORAL
  Filled 2022-11-21: qty 1

## 2022-11-21 MED ORDER — BUPIVACAINE-EPINEPHRINE (PF) 0.5% -1:200000 IJ SOLN
INTRAMUSCULAR | Status: DC | PRN
  Administered 2022-11-21: 20 mL via PERINEURAL

## 2022-11-21 MED ORDER — METHOCARBAMOL 1000 MG/10ML IJ SOLN: 500.0000 mg | Freq: Four times a day (QID) | INTRAMUSCULAR | Status: DC | PRN

## 2022-11-21 MED ORDER — FENTANYL CITRATE (PF) 100 MCG/2ML IJ SOLN
INTRAMUSCULAR | Status: DC | PRN
  Administered 2022-11-21 (×2): 50 ug via INTRAVENOUS

## 2022-11-21 MED ORDER — ORAL CARE MOUTH RINSE: 15.0000 mL | Freq: Once | OROMUCOSAL | Status: AC

## 2022-11-21 MED ORDER — BISACODYL 10 MG RE SUPP: 10.0000 mg | Freq: Every day | RECTAL | Status: DC | PRN

## 2022-11-21 MED ORDER — DOCUSATE SODIUM 100 MG PO CAPS
100.0000 mg | ORAL_CAPSULE | Freq: Two times a day (BID) | ORAL | Status: DC
  Administered 2022-11-21 – 2022-11-22 (×3): 100 mg via ORAL
  Filled 2022-11-21 (×3): qty 1

## 2022-11-21 MED ORDER — DEXAMETHASONE SODIUM PHOSPHATE 10 MG/ML IJ SOLN
8.0000 mg | Freq: Once | INTRAMUSCULAR | Status: AC
  Administered 2022-11-21: 8 mg via INTRAVENOUS

## 2022-11-21 MED ORDER — MEMANTINE HCL 10 MG PO TABS
5.0000 mg | ORAL_TABLET | Freq: Two times a day (BID) | ORAL | Status: DC
  Administered 2022-11-21 – 2022-11-22 (×2): 5 mg via ORAL
  Filled 2022-11-21 (×2): qty 1

## 2022-11-21 MED ORDER — ACETAMINOPHEN 500 MG PO TABS
1000.0000 mg | ORAL_TABLET | Freq: Four times a day (QID) | ORAL | Status: AC
  Administered 2022-11-21 – 2022-11-22 (×4): 1000 mg via ORAL
  Filled 2022-11-21 (×4): qty 2

## 2022-11-21 MED ORDER — LACTATED RINGERS IV SOLN: INTRAVENOUS | Status: DC

## 2022-11-21 MED ORDER — ANASTROZOLE 1 MG PO TABS
1.0000 mg | ORAL_TABLET | Freq: Every day | ORAL | Status: DC
  Administered 2022-11-22: 1 mg via ORAL
  Filled 2022-11-21: qty 1

## 2022-11-21 MED ORDER — OXYCODONE HCL 5 MG PO TABS
5.0000 mg | ORAL_TABLET | ORAL | Status: DC | PRN
  Administered 2022-11-21: 5 mg via ORAL
  Administered 2022-11-21: 10 mg via ORAL
  Administered 2022-11-21 (×2): 5 mg via ORAL
  Administered 2022-11-22: 10 mg via ORAL
  Filled 2022-11-21 (×3): qty 1
  Filled 2022-11-21 (×2): qty 2

## 2022-11-21 MED ORDER — DIPHENHYDRAMINE HCL 12.5 MG/5ML PO ELIX: 12.5000 mg | ORAL_SOLUTION | ORAL | Status: DC | PRN

## 2022-11-21 MED ORDER — CHLORHEXIDINE GLUCONATE 0.12 % MT SOLN
15.0000 mL | Freq: Once | OROMUCOSAL | Status: AC
  Administered 2022-11-21: 15 mL via OROMUCOSAL

## 2022-11-21 MED ORDER — MENTHOL 3 MG MT LOZG: 1.0000 | LOZENGE | OROMUCOSAL | Status: DC | PRN

## 2022-11-21 MED ORDER — LEVOTHYROXINE SODIUM 88 MCG PO TABS
88.0000 ug | ORAL_TABLET | Freq: Every day | ORAL | Status: DC
  Administered 2022-11-22: 88 ug via ORAL
  Filled 2022-11-21: qty 1

## 2022-11-21 MED ORDER — BUPIVACAINE LIPOSOME 1.3 % IJ SUSP: 20.0000 mL | Freq: Once | INTRAMUSCULAR | Status: DC

## 2022-11-21 MED ORDER — VANCOMYCIN HCL IN DEXTROSE 1-5 GM/200ML-% IV SOLN
1000.0000 mg | Freq: Once | INTRAVENOUS | Status: AC
  Administered 2022-11-21: 1000 mg via INTRAVENOUS
  Filled 2022-11-21: qty 200

## 2022-11-21 MED ORDER — PROPOFOL 500 MG/50ML IV EMUL
INTRAVENOUS | Status: DC | PRN
  Administered 2022-11-21: 100 ug/kg/min via INTRAVENOUS

## 2022-11-21 MED ORDER — TRANEXAMIC ACID-NACL 1000-0.7 MG/100ML-% IV SOLN
1000.0000 mg | INTRAVENOUS | Status: AC
  Administered 2022-11-21: 1000 mg via INTRAVENOUS
  Filled 2022-11-21: qty 100

## 2022-11-21 MED ORDER — CEFAZOLIN SODIUM-DEXTROSE 2-4 GM/100ML-% IV SOLN
2.0000 g | INTRAVENOUS | Status: AC
  Administered 2022-11-21: 2 g via INTRAVENOUS
  Filled 2022-11-21: qty 100

## 2022-11-21 MED ORDER — FENTANYL CITRATE PF 50 MCG/ML IJ SOSY
25.0000 ug | PREFILLED_SYRINGE | INTRAMUSCULAR | Status: DC | PRN
Start: 2022-11-21 — End: 2022-11-21

## 2022-11-21 MED ORDER — BUPIVACAINE LIPOSOME 1.3 % IJ SUSP
INTRAMUSCULAR | Status: AC
  Filled 2022-11-21: qty 20

## 2022-11-21 MED ORDER — METHOCARBAMOL 500 MG PO TABS
500.0000 mg | ORAL_TABLET | Freq: Four times a day (QID) | ORAL | Status: DC | PRN
  Administered 2022-11-22: 500 mg via ORAL
  Filled 2022-11-21 (×2): qty 1

## 2022-11-21 MED ORDER — MEMANTINE HCL 5 MG PO TABS: 5.0000 mg | ORAL_TABLET | Freq: Every day | ORAL | Status: DC

## 2022-11-21 MED ORDER — ACETAMINOPHEN 10 MG/ML IV SOLN
1000.0000 mg | Freq: Four times a day (QID) | INTRAVENOUS | Status: DC
  Administered 2022-11-21: 1000 mg via INTRAVENOUS
  Filled 2022-11-21: qty 100

## 2022-11-21 MED ORDER — ACETAMINOPHEN 10 MG/ML IV SOLN
INTRAVENOUS | Status: AC
  Filled 2022-11-21: qty 100

## 2022-11-21 MED ORDER — SODIUM CHLORIDE (PF) 0.9 % IJ SOLN
INTRAMUSCULAR | Status: DC | PRN
  Administered 2022-11-21: 60 mL

## 2022-11-21 MED ORDER — MORPHINE SULFATE (PF) 2 MG/ML IV SOLN: 1.0000 mg | INTRAVENOUS | Status: DC | PRN

## 2022-11-21 MED ORDER — FLEET ENEMA RE ENEM: 1.0000 | ENEMA | Freq: Once | RECTAL | Status: DC | PRN

## 2022-11-21 MED ORDER — BUPIVACAINE IN DEXTROSE 0.75-8.25 % IT SOLN
INTRATHECAL | Status: DC | PRN
  Administered 2022-11-21: 1.6 mL via INTRATHECAL

## 2022-11-21 MED ORDER — SODIUM CHLORIDE 0.9 % IV SOLN: INTRAVENOUS | Status: DC

## 2022-11-21 MED ORDER — ONDANSETRON HCL 4 MG/2ML IJ SOLN
INTRAMUSCULAR | Status: DC | PRN
  Administered 2022-11-21: 4 mg via INTRAVENOUS

## 2022-11-21 MED ORDER — CHLORHEXIDINE GLUCONATE 4 % EX SOLN: 1.0000 | CUTANEOUS | 1 refills | Status: DC

## 2022-11-21 MED ORDER — SODIUM CHLORIDE (PF) 0.9 % IJ SOLN
INTRAMUSCULAR | Status: AC
  Filled 2022-11-21: qty 10

## 2022-11-21 MED ORDER — ACETAMINOPHEN 325 MG PO TABS: 325.0000 mg | ORAL_TABLET | Freq: Four times a day (QID) | ORAL | Status: DC | PRN

## 2022-11-21 MED ORDER — NITROFURANTOIN MACROCRYSTAL 50 MG PO CAPS
50.0000 mg | ORAL_CAPSULE | Freq: Every day | ORAL | Status: DC
  Administered 2022-11-21: 50 mg via ORAL
  Filled 2022-11-21 (×2): qty 1

## 2022-11-21 MED ORDER — CEFAZOLIN SODIUM-DEXTROSE 2-4 GM/100ML-% IV SOLN
2.0000 g | Freq: Four times a day (QID) | INTRAVENOUS | Status: AC
  Administered 2022-11-21 (×2): 2 g via INTRAVENOUS
  Filled 2022-11-21 (×2): qty 100

## 2022-11-21 MED ORDER — PHENOL 1.4 % MT LIQD: 1.0000 | OROMUCOSAL | Status: DC | PRN

## 2022-11-21 MED ORDER — 0.9 % SODIUM CHLORIDE (POUR BTL) OPTIME
TOPICAL | Status: DC | PRN
  Administered 2022-11-21: 1000 mL

## 2022-11-21 MED ORDER — POVIDONE-IODINE 10 % EX SWAB: 2.0000 | Freq: Once | CUTANEOUS | Status: DC

## 2022-11-21 MED ORDER — SODIUM CHLORIDE 0.9 % IR SOLN
Status: DC | PRN
  Administered 2022-11-21: 1000 mL

## 2022-11-21 MED ORDER — SODIUM CHLORIDE (PF) 0.9 % IJ SOLN
INTRAMUSCULAR | Status: AC
  Filled 2022-11-21: qty 50

## 2022-11-21 SURGICAL SUPPLY — 52 items
ADH SKN CLS APL DERMABOND .7 (GAUZE/BANDAGES/DRESSINGS) ×1
ATTUNE PSFEM LTSZ5 NARCEM KNEE (Femur) IMPLANT
ATTUNE PSRP INSE SZ5 7 KNEE (Insert) IMPLANT
BAG COUNTER SPONGE SURGICOUNT (BAG) IMPLANT
BAG SPEC THK2 15X12 ZIP CLS (MISCELLANEOUS) ×1
BAG SPNG CNTER NS LX DISP (BAG)
BAG ZIPLOCK 12X15 (MISCELLANEOUS) ×2 IMPLANT
BASEPLATE TIBIAL ROTATING SZ 4 (Knees) IMPLANT
BLADE SAG 18X100X1.27 (BLADE) ×2 IMPLANT
BLADE SAW SGTL 11.0X1.19X90.0M (BLADE) ×2 IMPLANT
BNDG CMPR 6 X 5 YARDS HK CLSR (GAUZE/BANDAGES/DRESSINGS) ×1
BNDG ELASTIC 6INX 5YD STR LF (GAUZE/BANDAGES/DRESSINGS) ×2 IMPLANT
BOWL SMART MIX CTS (DISPOSABLE) ×2 IMPLANT
BSPLAT TIB 4 CMNT ROT PLAT STR (Knees) ×1 IMPLANT
CEMENT HV SMART SET (Cement) ×4 IMPLANT
COVER SURGICAL LIGHT HANDLE (MISCELLANEOUS) ×2 IMPLANT
CUFF TOURN SGL QUICK 34 (TOURNIQUET CUFF) ×1
CUFF TRNQT CYL 34X4.125X (TOURNIQUET CUFF) ×2 IMPLANT
DERMABOND ADVANCED .7 DNX12 (GAUZE/BANDAGES/DRESSINGS) ×2 IMPLANT
DRAPE U-SHAPE 47X51 STRL (DRAPES) ×2 IMPLANT
DRSG AQUACEL AG ADV 3.5X10 (GAUZE/BANDAGES/DRESSINGS) ×2 IMPLANT
DURAPREP 26ML APPLICATOR (WOUND CARE) ×2 IMPLANT
ELECT REM PT RETURN 15FT ADLT (MISCELLANEOUS) ×2 IMPLANT
GLOVE BIO SURGEON STRL SZ 6.5 (GLOVE) IMPLANT
GLOVE BIO SURGEON STRL SZ8 (GLOVE) ×2 IMPLANT
GLOVE BIOGEL PI IND STRL 6.5 (GLOVE) IMPLANT
GLOVE BIOGEL PI IND STRL 7.0 (GLOVE) IMPLANT
GLOVE BIOGEL PI IND STRL 8 (GLOVE) ×2 IMPLANT
GOWN STRL REUS W/ TWL LRG LVL3 (GOWN DISPOSABLE) ×2 IMPLANT
GOWN STRL REUS W/TWL LRG LVL3 (GOWN DISPOSABLE) ×1
HANDPIECE INTERPULSE COAX TIP (DISPOSABLE) ×1
HOLDER FOLEY CATH W/STRAP (MISCELLANEOUS) IMPLANT
IMMOBILIZER KNEE 20 (SOFTGOODS) ×1
IMMOBILIZER KNEE 20 THIGH 36 (SOFTGOODS) ×2 IMPLANT
KIT TURNOVER KIT A (KITS) IMPLANT
MANIFOLD NEPTUNE II (INSTRUMENTS) ×2 IMPLANT
NS IRRIG 1000ML POUR BTL (IV SOLUTION) ×2 IMPLANT
PACK TOTAL KNEE CUSTOM (KITS) ×2 IMPLANT
PADDING CAST COTTON 6X4 STRL (CAST SUPPLIES) ×4 IMPLANT
PATELLA MEDIAL ATTUN 35MM KNEE (Knees) IMPLANT
PIN STEINMAN FIXATION KNEE (PIN) IMPLANT
PROTECTOR NERVE ULNAR (MISCELLANEOUS) ×2 IMPLANT
SET HNDPC FAN SPRY TIP SCT (DISPOSABLE) ×2 IMPLANT
SUT MNCRL AB 4-0 PS2 18 (SUTURE) ×2 IMPLANT
SUT STRATAFIX 0 PDS 27 VIOLET (SUTURE) ×1
SUT VIC AB 2-0 CT1 27 (SUTURE) ×3
SUT VIC AB 2-0 CT1 TAPERPNT 27 (SUTURE) ×6 IMPLANT
SUTURE STRATFX 0 PDS 27 VIOLET (SUTURE) ×2 IMPLANT
TRAY FOLEY MTR SLVR 16FR STAT (SET/KITS/TRAYS/PACK) IMPLANT
TUBE SUCTION HIGH CAP CLEAR NV (SUCTIONS) ×2 IMPLANT
WATER STERILE IRR 1000ML POUR (IV SOLUTION) ×4 IMPLANT
WRAP KNEE MAXI GEL POST OP (GAUZE/BANDAGES/DRESSINGS) ×2 IMPLANT

## 2022-11-21 NOTE — Anesthesia Procedure Notes (Signed)
Spinal  Patient location during procedure: OR Start time: 11/21/2022 7:12 AM End time: 11/21/2022 7:17 AM Reason for block: surgical anesthesia Staffing Performed: anesthesiologist  Anesthesiologist: Gaynelle Adu, MD Performed by: Gaynelle Adu, MD Authorized by: Gaynelle Adu, MD   Preanesthetic Checklist Completed: patient identified, IV checked, risks and benefits discussed, surgical consent, monitors and equipment checked, pre-op evaluation and timeout performed Spinal Block Patient position: sitting Prep: DuraPrep Patient monitoring: cardiac monitor, continuous pulse ox and blood pressure Approach: midline Location: L3-4 Injection technique: single-shot Needle Needle type: Pencan  Needle gauge: 24 G Needle length: 9 cm Assessment Sensory level: T8 Events: CSF return Additional Notes Functioning IV was confirmed and monitors were applied. Sterile prep and drape, including hand hygiene and sterile gloves were used. The patient was positioned and the spine was prepped. The skin was anesthetized with lidocaine.  Free flow of clear CSF was obtained prior to injecting local anesthetic into the CSF.  The spinal needle aspirated freely following injection.  The needle was carefully withdrawn.  The patient tolerated the procedure well.

## 2022-11-21 NOTE — Anesthesia Procedure Notes (Signed)
Anesthesia Regional Block: Adductor canal block   Pre-Anesthetic Checklist: , timeout performed,  Correct Patient, Correct Site, Correct Laterality,  Correct Procedure, Correct Position, site marked,  Risks and benefits discussed,  Pre-op evaluation,  At surgeon's request and post-op pain management  Laterality: Left  Prep: Maximum Sterile Barrier Precautions used, chloraprep       Needles:  Injection technique: Single-shot  Needle Type: Echogenic Stimulator Needle     Needle Length: 9cm  Needle Gauge: 21     Additional Needles:   Procedures:,,,, ultrasound used (permanent image in chart),,    Narrative:  Start time: 11/21/2022 6:40 AM End time: 11/21/2022 6:50 AM Injection made incrementally with aspirations every 5 mL.  Performed by: Personally  Anesthesiologist: Gaynelle Adu, MD

## 2022-11-21 NOTE — Anesthesia Postprocedure Evaluation (Signed)
Anesthesia Post Note  Patient: Kim Krueger  Procedure(s) Performed: TOTAL KNEE ARTHROPLASTY (Left: Knee)     Patient location during evaluation: PACU Anesthesia Type: Spinal and Regional Level of consciousness: oriented and awake and alert Pain management: pain level controlled Vital Signs Assessment: post-procedure vital signs reviewed and stable Respiratory status: spontaneous breathing and respiratory function stable Cardiovascular status: blood pressure returned to baseline and stable Postop Assessment: no headache, no backache, no apparent nausea or vomiting, spinal receding and patient able to bend at knees Anesthetic complications: no  No notable events documented.  Last Vitals:  Vitals:   11/21/22 1000 11/21/22 1020  BP: (!) 143/61 (!) 156/81  Pulse: 66 66  Resp: 12 15  Temp:  36.4 C  SpO2: 95% 97%    Last Pain:  Vitals:   11/21/22 1040  TempSrc:   PainSc: 5                  [place] Churchill,W. EDMOND

## 2022-11-21 NOTE — Interval H&P Note (Signed)
History and Physical Interval Note:  11/21/2022 6:26 AM  [name]  has presented today for surgery, with the diagnosis of left knee osteoarthritis.  The various methods of treatment have been discussed with the patient and family. After consideration of risks, benefits and other options for treatment, the patient has consented to  Procedure(s): TOTAL KNEE ARTHROPLASTY (Left) as a surgical intervention.  The patient's history has been reviewed, patient examined, no change in status, stable for surgery.  I have reviewed the patient's chart and labs.  Questions were answered to the patient's satisfaction.     Kim Krueger Nena Hampe

## 2022-11-21 NOTE — Discharge Instructions (Signed)
Kim Gross, MD Total Joint Specialist [facility] [address], [address] [place], [place] [address] [phone]  TOTAL KNEE REPLACEMENT POSTOPERATIVE DIRECTIONS    Knee Rehabilitation, Guidelines Following Surgery  Results after knee surgery are often greatly improved when you follow the exercise, range of motion and muscle strengthening exercises prescribed by your doctor. Safety measures are also important to protect the knee from further injury. If any of these exercises cause you to have increased pain or swelling in your knee joint, decrease the amount until you are comfortable again and slowly increase them. If you have problems or questions, call your caregiver or physical therapist for advice.   BLOOD CLOT PREVENTION Take an 81 mg Aspirin two times a day for three weeks following surgery. Then take an 81 mg Aspirin once a day for three weeks. Then discontinue Aspirin. You may resume your vitamins/supplements upon discharge from the hospital. Do not take any NSAIDs (Advil, Aleve, Ibuprofen, Meloxicam, etc.) until you have discontinued the 81 mg Aspirin twice a day.  GABAPENTIN INSTRUCTIONS Take a 300 mg capsule three times a day for two weeks following surgery.Then take a 300 mg capsule two times a day for two weeks. Then take a 300 mg capsule once a day for two weeks. Then discontinue.  HOME CARE INSTRUCTIONS  Remove items at home which could result in a fall. This includes throw rugs or furniture in walking pathways.  ICE to the affected knee as much as tolerated. Icing helps control swelling. If the swelling is well controlled you will be more comfortable and rehab easier. Continue to use ice on the knee for pain and swelling from surgery. You may notice swelling that will progress down to the foot and ankle. This is normal after surgery. Elevate the leg when you are not up walking on it.    Continue to use the breathing machine which will help keep your  temperature down. It is common for your temperature to cycle up and down following surgery, especially at night when you are not up moving around and exerting yourself. The breathing machine keeps your lungs expanded and your temperature down. Do not place pillow under the operative knee, focus on keeping the knee straight while resting  DIET You may resume your previous home diet once you are discharged from the hospital.  DRESSING / WOUND CARE / SHOWERING Keep your bulky bandage on for 2 days. On the third post-operative day you may remove the Ace bandage and gauze. There is a waterproof adhesive bandage on your skin which will stay in place until your first follow-up appointment. Once you remove this you will not need to place another bandage You may begin showering 3 days following surgery, but do not submerge the incision under water.  ACTIVITY For the first 5 days, the key is rest and control of pain and swelling Do your home exercises twice a day starting on post-operative day 3. On the days you go to physical therapy, just do the home exercises once that day. You should rest, ice and elevate the leg for 50 minutes out of every hour. Get up and walk/stretch for 10 minutes per hour. After 5 days you can increase your activity slowly as tolerated. Walk with your walker as instructed. Use the walker until you are comfortable transitioning to a cane. Walk with the cane in the opposite hand of the operative leg. You may discontinue the cane once you are comfortable and walking steadily. Avoid periods  of inactivity such as sitting longer than an hour when not asleep. This helps prevent blood clots.  You may discontinue the knee immobilizer once you are able to perform a straight leg raise while lying down. You may resume a sexual relationship in one month or when given the OK by your doctor.  You may return to work once you are cleared by your doctor.  Do not drive a car for 6 weeks or until  released by your surgeon.  Do not drive while taking narcotics.  TED HOSE STOCKINGS Wear the elastic stockings on both legs for three weeks following surgery during the day. You may remove them at night for sleeping.  WEIGHT BEARING Weight bearing as tolerated with assist device (walker, cane, etc) as directed, use it as long as suggested by your surgeon or therapist, typically at least 4-6 weeks.  POSTOPERATIVE CONSTIPATION PROTOCOL Constipation - defined medically as fewer than three stools per week and severe constipation as less than one stool per week.  One of the most common issues patients have following surgery is constipation.  Even if you have a regular bowel pattern at home, your normal regimen is likely to be disrupted due to multiple reasons following surgery.  Combination of anesthesia, postoperative narcotics, change in appetite and fluid intake all can affect your bowels.  In order to avoid complications following surgery, here are some recommendations in order to help you during your recovery period.  Colace (docusate) - Pick up an over-the-counter form of Colace or another stool softener and take twice a day as long as you are requiring postoperative pain medications.  Take with a full glass of water daily.  If you experience loose stools or diarrhea, hold the colace until you stool forms back up. If your symptoms do not get better within 1 week or if they get worse, check with your doctor. Dulcolax (bisacodyl) - Pick up over-the-counter and take as directed by the product packaging as needed to assist with the movement of your bowels.  Take with a full glass of water.  Use this product as needed if not relieved by Colace only.  MiraLax (polyethylene glycol) - Pick up over-the-counter to have on hand. MiraLax is a solution that will increase the amount of water in your bowels to assist with bowel movements.  Take as directed and can mix with a glass of water, juice, soda, coffee, or  tea. Take if you go more than two days without a movement. Do not use MiraLax more than once per day. Call your doctor if you are still constipated or irregular after using this medication for 7 days in a row.  If you continue to have problems with postoperative constipation, please contact the office for further assistance and recommendations.  If you experience "the worst abdominal pain ever" or develop nausea or vomiting, please contact the office immediatly for further recommendations for treatment.  ITCHING If you experience itching with your medications, try taking only a single pain pill, or even half a pain pill at a time.  You can also use Benadryl over the counter for itching or also to help with sleep.   MEDICATIONS See your medication summary on the "After Visit Summary" that the nursing staff will review with you prior to discharge.  You may have some home medications which will be placed on hold until you complete the course of blood thinner medication.  It is important for you to complete the blood thinner medication as prescribed  by your surgeon.  Continue your approved medications as instructed at time of discharge.  PRECAUTIONS If you experience chest pain or shortness of breath - call 911 immediately for transfer to the hospital emergency department.  If you develop a fever greater that 101 F, purulent drainage from wound, increased redness or drainage from wound, foul odor from the wound/dressing, or calf pain - CONTACT YOUR SURGEON.                                                   FOLLOW-UP APPOINTMENTS Make sure you keep all of your appointments after your operation with your surgeon and caregivers. You should call the office at the above phone number and make an appointment for approximately two weeks after the date of your surgery or on the date instructed by your surgeon outlined in the "After Visit Summary".  RANGE OF MOTION AND STRENGTHENING EXERCISES  Rehabilitation of  the knee is important following a knee injury or an operation. After just a few days of immobilization, the muscles of the thigh which control the knee become weakened and shrink (atrophy). Knee exercises are designed to build up the tone and strength of the thigh muscles and to improve knee motion. Often times heat used for twenty to thirty minutes before working out will loosen up your tissues and help with improving the range of motion but do not use heat for the first two weeks following surgery. These exercises can be done on a training (exercise) mat, on the floor, on a table or on a bed. Use what ever works the best and is most comfortable for you Knee exercises include:  Leg Lifts - While your knee is still immobilized in a splint or cast, you can do straight leg raises. Lift the leg to 60 degrees, hold for 3 sec, and slowly lower the leg. Repeat 10-20 times 2-3 times daily. Perform this exercise against resistance later as your knee gets better.  Quad and Hamstring Sets - Tighten up the muscle on the front of the thigh (Quad) and hold for 5-10 sec. Repeat this 10-20 times hourly. Hamstring sets are done by pushing the foot backward against an object and holding for 5-10 sec. Repeat as with quad sets.  Leg Slides: Lying on your back, slowly slide your foot toward your buttocks, bending your knee up off the floor (only go as far as is comfortable). Then slowly slide your foot back down until your leg is flat on the floor again. Angel Wings: Lying on your back spread your legs to the side as far apart as you can without causing discomfort.  A rehabilitation program following serious knee injuries can speed recovery and prevent re-injury in the future due to weakened muscles. Contact your doctor or a physical therapist for more information on knee rehabilitation.   POST-OPERATIVE OPIOID TAPER INSTRUCTIONS: It is important to wean off of your opioid medication as soon as possible. If you do not need pain  medication after your surgery it is ok to stop day one. Opioids include: Codeine, Hydrocodone(Norco, Vicodin), Oxycodone(Percocet, oxycontin) and hydromorphone amongst others.  Long term and even short term use of opiods can cause: Increased pain response Dependence Constipation Depression Respiratory depression And more.  Withdrawal symptoms can include Flu like symptoms Nausea, vomiting And more Techniques to manage these symptoms Hydrate well  Eat regular healthy meals Stay active Use relaxation techniques(deep breathing, meditating, yoga) Do Not substitute Alcohol to help with tapering If you have been on opioids for less than two weeks and do not have pain than it is ok to stop all together.  Plan to wean off of opioids This plan should start within one week post op of your joint replacement. Maintain the same interval or time between taking each dose and first decrease the dose.  Cut the total daily intake of opioids by one tablet each day Next start to increase the time between doses. The last dose that should be eliminated is the evening dose.   IF YOU ARE TRANSFERRED TO A SKILLED REHAB FACILITY If the patient is transferred to a skilled rehab facility following release from the hospital, a list of the current medications will be sent to the facility for the patient to continue.  When discharged from the skilled rehab facility, please have the facility set up the patient's Home Health Physical Therapy prior to being released. Also, the skilled facility will be responsible for providing the patient with their medications at time of release from the facility to include their pain medication, the muscle relaxants, and their blood thinner medication. If the patient is still at the rehab facility at time of the two week follow up appointment, the skilled rehab facility will also need to assist the patient in arranging follow up appointment in our office and any transportation  needs.  MAKE SURE YOU:  Understand these instructions.  Get help right away if you are not doing well or get worse.   DENTAL ANTIBIOTICS:  In most cases prophylactic antibiotics for Dental procdeures after total joint surgery are not necessary.  Exceptions are as follows:  1. History of prior total joint infection  2. Severely immunocompromised (Organ Transplant, cancer chemotherapy, Rheumatoid biologic medications such as Humera)  3. Poorly controlled diabetes (A1C &gt; 8.0, blood glucose over 200)  If you have one of these conditions, contact your surgeon for an antibiotic prescription, prior to your dental procedure.    Pick up stool softner and laxative for home use following surgery while on pain medications. Do not submerge incision under water. Please use good hand washing techniques while changing dressing each day. May shower starting three days after surgery. Please use a clean towel to pat the incision dry following showers. Continue to use ice for pain and swelling after surgery. Do not use any lotions or creams on the incision until instructed by your surgeon.

## 2022-11-21 NOTE — Op Note (Signed)
OPERATIVE REPORT-TOTAL KNEE ARTHROPLASTY   Pre-operative diagnosis- Osteoarthritis  Left knee(s)  Post-operative diagnosis- Osteoarthritis Left knee(s)  Procedure-  Left  Total Knee Arthroplasty  Surgeon- Kim Rankin. Corrinna Karapetyan, MD  Assistant- Weston Brass, PA-C   Anesthesia-   Adductor canal block and spinal  EBL- 25 ml   Drains None  Tourniquet time- 31 minutes @ 300 mm HG    Complications- None  Condition-PACU - hemodynamically stable.   Brief Clinical Note  Kim Krueger is a 82 y.o. year old female with end stage OA of her left knee with progressively worsening pain and dysfunction. She has constant pain, with activity and at rest and significant functional deficits with difficulties even with ADLs. She has had extensive non-op management including analgesics, injections of cortisone and viscosupplements, and home exercise program, but remains in significant pain with significant dysfunction. Radiographs show bone on bone arthritis all 3 compartments. She presents now for left Total Knee Arthroplasty.     Procedure in detail---   The patient is brought into the operating room and positioned supine on the operating table. After successful administration of  Adductor canal block and spinal,   a tourniquet is placed high on the  Left thigh(s) and the lower extremity is prepped and draped in the usual sterile fashion. Time out is performed by the operating team and then the  Left lower extremity is wrapped in Esmarch, knee flexed and the tourniquet inflated to 300 mmHg.       A midline incision is made with a ten blade through the subcutaneous tissue to the level of the extensor mechanism. A fresh blade is used to make a medial parapatellar arthrotomy. Soft tissue over the proximal medial tibia is subperiosteally elevated to the joint line with a knife and into the semimembranosus bursa with a Cobb elevator. Soft tissue over the proximal lateral tibia is elevated with attention  being paid to avoiding the patellar tendon on the tibial tubercle. The patella is everted, knee flexed 90 degrees and the ACL and PCL are removed. Findings are bone on bone all 3 compartments with massive global osteophytes        The drill is used to create a starting hole in the distal femur and the canal is thoroughly irrigated with sterile saline to remove the fatty contents. The 5 degree Left  valgus alignment guide is placed into the femoral canal and the distal femoral cutting block is pinned to remove 9 mm off the distal femur. Resection is made with an oscillating saw.      The tibia is subluxed forward and the menisci are removed. The extramedullary alignment guide is placed referencing proximally at the medial aspect of the tibial tubercle and distally along the second metatarsal axis and tibial crest. The block is pinned to remove 2mm off the more deficient medial  side. Resection is made with an oscillating saw. Size 4is the most appropriate size for the tibia and the proximal tibia is prepared with the modular drill and keel punch for that size.      The femoral sizing guide is placed and size 5 is most appropriate. Rotation is marked off the epicondylar axis and confirmed by creating a rectangular flexion gap at 90 degrees. The size 5 cutting block is pinned in this rotation and the anterior, posterior and chamfer cuts are made with the oscillating saw. The intercondylar block is then placed and that cut is made.      Trial size 4 tibial  component, trial size 5 narrow posterior stabilized femur and a 7  mm posterior stabilized rotating platform insert trial is placed. Full extension is achieved with excellent varus/valgus and anterior/posterior balance throughout full range of motion. The patella is everted and thickness measured to be 22  mm. Free hand resection is taken to 12 mm, a 35 template is placed, lug holes are drilled, trial patella is placed, and it tracks normally. Osteophytes are  removed off the posterior femur with the trial in place. All trials are removed and the cut bone surfaces prepared with pulsatile lavage. Cement is mixed and once ready for implantation, the size 4 tibial implant, size  5 narrow posterior stabilized femoral component, and the size 35 patella are cemented in place and the patella is held with the clamp. The trial insert is placed and the knee held in full extension. The Exparel (20 ml mixed with 60 ml saline) is injected into the extensor mechanism, posterior capsule, medial and lateral gutters and subcutaneous tissues.  All extruded cement is removed and once the cement is hard the permanent 7 mm posterior stabilized rotating platform insert is placed into the tibial tray.      The wound is copiously irrigated with saline solution and the extensor mechanism closed with # 0 Stratofix suture. The tourniquet is released for a total tourniquet time of 31  minutes. Flexion against gravity is 130 degrees and the patella tracks normally. Subcutaneous tissue is closed with 2.0 vicryl and subcuticular with running 4.0 Monocryl. The incision is cleaned and dried and steri-strips and a bulky sterile dressing are applied. The limb is placed into a knee immobilizer and the patient is awakened and transported to recovery in stable condition.      Please note that a surgical assistant was a medical necessity for this procedure in order to perform it in a safe and expeditious manner. Surgical assistant was necessary to retract the ligaments and vital neurovascular structures to prevent injury to them and also necessary for proper positioning of the limb to allow for anatomic placement of the prosthesis.   Kim Rankin Alanson Hausmann, MD    11/21/2022, 8:17 AM

## 2022-11-21 NOTE — Plan of Care (Signed)
  Problem: Education: Goal: Knowledge of General Education information will improve Description: Including pain rating scale, medication(s)/side effects and non-pharmacologic comfort measures Outcome: Progressing   Problem: Clinical Measurements: Goal: Ability to maintain clinical measurements within normal limits will improve Outcome: Progressing   Problem: Activity: Goal: Risk for activity intolerance will decrease Outcome: Progressing   Problem: Nutrition: Goal: Adequate nutrition will be maintained Outcome: Progressing   Problem: Coping: Goal: Level of anxiety will decrease Outcome: Progressing   Problem: Elimination: Goal: Will not experience complications related to bowel motility Outcome: Progressing   Problem: Pain Management: Goal: General experience of comfort will improve Outcome: Progressing   Problem: Safety: Goal: Ability to remain free from injury will improve Outcome: Progressing   Problem: Skin Integrity: Goal: Risk for impaired skin integrity will decrease Outcome: Progressing

## 2022-11-21 NOTE — Evaluation (Signed)
Physical Therapy Evaluation Patient Details Name: Kim Krueger MRN: [id number] DOB: 1940/10/12 Today's Date: 11/21/2022  History of Present Illness  Pt is 82 yo female admitted on 11/21/22 for L TKA>  Pt with hx including but not limited to anemia, OP, OA, OAB  Clinical Impression  Pt is s/p TKA resulting in the deficits listed below (see PT Problem List). At baseline pt is independent.  She has home support and DME.  Today, pt was able to ambulate 8' with RW and CGA.  She had 1/10 pain at rest but up to 6/10 with walking limiting her distance.  Pt expected to progress well with therapy.   Pt will benefit from acute skilled PT to increase their independence and safety with mobility to allow discharge.          If plan is discharge home, recommend the following: A little help with walking and/or transfers;A little help with bathing/dressing/bathroom;Assistance with cooking/housework;Help with stairs or ramp for entrance   Can travel by private vehicle        Equipment Recommendations None recommended by PT  Recommendations for Other Services       Functional Status Assessment Patient has had a recent decline in their functional status and demonstrates the ability to make significant improvements in function in a reasonable and predictable amount of time.     Precautions / Restrictions Precautions Precautions: Fall;Knee Restrictions Weight Bearing Restrictions: Yes LLE Weight Bearing: Weight bearing as tolerated      Mobility  Bed Mobility Overal bed mobility: Needs Assistance Bed Mobility: Supine to Sit     Supine to sit: Supervision, HOB elevated          Transfers Overall transfer level: Needs assistance Equipment used: Rolling walker (2 wheels) Transfers: Sit to/from Stand Sit to Stand: Min assist           General transfer comment: cues for hand placement and L LE management wtih light min A to rise    Ambulation/Gait Ambulation/Gait assistance:  Contact guard assist Gait Distance (Feet): 8 Feet Assistive device: Rolling walker (2 wheels) Gait Pattern/deviations: Step-to pattern, Decreased stride length, Decreased weight shift to left Gait velocity: decreased     General Gait Details: LImited due to pain with weight bearing, cues for sequencing  Stairs            Wheelchair Mobility     Tilt Bed    Modified Rankin (Stroke Patients Only)       Balance Overall balance assessment: Needs assistance Sitting-balance support: No upper extremity supported Sitting balance-Leahy Scale: Good     Standing balance support: Bilateral upper extremity supported Standing balance-Leahy Scale: Poor Standing balance comment: steady with RW                             Pertinent Vitals/Pain Pain Assessment Pain Assessment: 0-10 Pain Score:  (1/10 rest; 6/10 weight bearing) Pain Location: L knee Pain Descriptors / Indicators: Discomfort Pain Intervention(s): Limited activity within patient's tolerance, Monitored during session, Premedicated before session, Repositioned, Ice applied    Home Living Family/patient expects to be discharged to:: Private residence Living Arrangements: Spouse/significant other Available Help at Discharge: Family;Available 24 hours/day Type of Home: House Home Access: Stairs to enter Entrance Stairs-Rails: Right Entrance Stairs-Number of Steps: 6   Home Layout: One level Home Equipment: Shower seat;Grab bars - tub/shower;Rolling Walker (2 wheels);Gilmer Mor - single point      Prior Function  Mobility Comments: Could ambulate in house with cane and then cane or RW ini community ADLs Comments: Independent adls and iadls     Extremity/Trunk Assessment   Upper Extremity Assessment Upper Extremity Assessment: Overall WFL for tasks assessed    Lower Extremity Assessment Lower Extremity Assessment: LLE deficits/detail LLE Deficits / Details: Expected post op changes;  ROM Knee 5 to 85 degrees; MMT: ankle 5/5, knee ext 3/5, hip flex 3/5; <10 ext lag    Cervical / Trunk Assessment Cervical / Trunk Assessment: Normal  Communication      Cognition Arousal: Alert Behavior During Therapy: WFL for tasks assessed/performed Overall Cognitive Status: Within Functional Limits for tasks assessed                                          General Comments General comments (skin integrity, edema, etc.): Educated on ankle pumps and quad sets - performed 5 of each    Exercises     Assessment/Plan    PT Assessment Patient needs continued PT services  PT Problem List Decreased strength;Pain;Decreased range of motion;Decreased activity tolerance;Decreased balance;Decreased mobility;Decreased knowledge of use of DME       PT Treatment Interventions DME instruction;Therapeutic exercise;Gait training;Balance training;Functional mobility training;Therapeutic activities;Patient/family education;Stair training;Modalities    PT Goals (Current goals can be found in the Care Plan section)  Acute Rehab PT Goals Patient Stated Goal: return home PT Goal Formulation: With patient/family Time For Goal Achievement: 12/05/22 Potential to Achieve Goals: Good    Frequency 7X/week     Co-evaluation               AM-PAC PT "6 Clicks" Mobility  Outcome Measure Help needed turning from your back to your side while in a flat bed without using bedrails?: A Little Help needed moving from lying on your back to sitting on the side of a flat bed without using bedrails?: A Little Help needed moving to and from a bed to a chair (including a wheelchair)?: A Little Help needed standing up from a chair using your arms (e.g., wheelchair or bedside chair)?: A Little Help needed to walk in hospital room?: A Lot Help needed climbing 3-5 steps with a railing? : A Lot 6 Click Score: 16    End of Session Equipment Utilized During Treatment: Gait belt Activity  Tolerance: Patient tolerated treatment well Patient left: with chair alarm set;with call bell/phone within reach;in chair Nurse Communication: Mobility status PT Visit Diagnosis: Other abnormalities of gait and mobility (R26.89);Muscle weakness (generalized) (M62.81)    Time: 1450-1506 PT Time Calculation (min) (ACUTE ONLY): 16 min   Charges:   PT Evaluation $PT Eval Low Complexity: 1 Low   PT General Charges $$ ACUTE PT VISIT: 1 Visit         Anise Salvo, PT Acute Rehab Services [facility] [phone]   Rayetta Humphrey 11/21/2022, 3:30 PM

## 2022-11-21 NOTE — Progress Notes (Signed)
Orthopedic Tech Progress Note Patient Details:  Kim Krueger 10/15/1940 [id number]  CPM Left Knee CPM Left Knee: On Left Knee Flexion (Degrees): 40 Left Knee Extension (Degrees): 10  Post Interventions Patient Tolerated: Well  Darleen Crocker 11/21/2022, 9:18 AM

## 2022-11-21 NOTE — Transfer of Care (Signed)
Immediate Anesthesia Transfer of Care Note  Patient: [name]  Procedure(s) Performed: TOTAL KNEE ARTHROPLASTY (Left: Knee)  Patient Location: PACU  Anesthesia Type:Spinal  Level of Consciousness: drowsy and patient cooperative  Airway & Oxygen Therapy: Patient Spontanous Breathing and Patient connected to face mask oxygen  Post-op Assessment: Report given to RN and Post -op Vital signs reviewed and stable  Post vital signs: Reviewed and stable  Last Vitals:  Vitals Value Taken Time  BP 128/60 11/21/22 0841  Temp    Pulse 71 11/21/22 0843  Resp 14 11/21/22 0843  SpO2 100 % 11/21/22 0843  Vitals shown include unfiled device data.  Last Pain:  Vitals:   11/21/22 0547  TempSrc:   PainSc: 0-No pain      Patients Stated Pain Goal: 5 (11/21/22 0547)  Complications: No notable events documented.

## 2022-11-21 NOTE — Progress Notes (Signed)
Orthopedic Tech Progress Note Patient Details:  Kim Krueger 1940-04-22 [id number]  Patient ID: [name], female   DOB: July 14, 1940, 82 y.o.   MRN: [id number] CPM removed by 3W staff. [place] A Arleigh Dicola 11/21/2022, 1:09 PM

## 2022-11-22 DIAGNOSIS — M1712 Unilateral primary osteoarthritis, left knee: Secondary | ICD-10-CM | POA: Diagnosis not present

## 2022-11-22 LAB — BASIC METABOLIC PANEL
Anion gap: 7 (ref 5–15)
BUN: 15 mg/dL (ref 8–23)
CO2: 21 mmol/L — ABNORMAL LOW (ref 22–32)
Calcium: 8.5 mg/dL — ABNORMAL LOW (ref 8.9–10.3)
Chloride: 111 mmol/L (ref 98–111)
Creatinine, Ser: 0.82 mg/dL (ref 0.44–1.00)
GFR, Estimated: >60 mL/min (ref >60)
Glucose, Bld: 127 mg/dL — ABNORMAL HIGH (ref 70–99)
Potassium: 4 mmol/L (ref 3.5–5.1)
Sodium: 139 mmol/L (ref 135–145)

## 2022-11-22 LAB — CBC
HCT: 28.4 % — ABNORMAL LOW (ref 36.0–46.0)
Hemoglobin: 8.5 g/dL — ABNORMAL LOW (ref 12.0–15.0)
MCH: 26.9 pg (ref 26.0–34.0)
MCHC: 29.9 g/dL — ABNORMAL LOW (ref 30.0–36.0)
MCV: 89.9 fL (ref 80.0–100.0)
Platelets: 203 10*3/uL (ref 150–400)
RBC: 3.16 MIL/uL — ABNORMAL LOW (ref 3.87–5.11)
RDW: 14.6 % (ref 11.5–15.5)
WBC: 6.5 10*3/uL (ref 4.0–10.5)
nRBC: 0 % (ref 0.0–0.2)

## 2022-11-22 MED ORDER — METHOCARBAMOL 500 MG PO TABS: 500.0000 mg | ORAL_TABLET | Freq: Four times a day (QID) | ORAL | 0 refills | Status: DC | PRN

## 2022-11-22 MED ORDER — ASPIRIN 81 MG PO CHEW: 81.0000 mg | CHEWABLE_TABLET | Freq: Two times a day (BID) | ORAL | 0 refills | Status: AC

## 2022-11-22 MED ORDER — GABAPENTIN 300 MG PO CAPS: ORAL_CAPSULE | ORAL | 0 refills | Status: DC

## 2022-11-22 MED ORDER — OXYCODONE HCL 5 MG PO TABS: 5.0000 mg | ORAL_TABLET | Freq: Four times a day (QID) | ORAL | 0 refills | Status: DC | PRN

## 2022-11-22 NOTE — Progress Notes (Signed)
Subjective: 1 Day Post-Op Procedure(s) (LRB): TOTAL KNEE ARTHROPLASTY (Left) Patient reports pain as mild.   Patient seen in rounds by Dr. Lequita Halt. Patient is well, and has had no acute complaints or problems No issues overnight. Denies chest pain, SOB, or calf pain. Foley catheter removed this AM.  We will continue therapy today, ambulated 8' yesterday.   Objective: Vital signs in last 24 hours: Temp:  [97.4 F (36.3 C)-98.4 F (36.9 C)] 98.1 F (36.7 C) (11/05 0624) Pulse Rate:  [60-81] 60 (11/05 0624) Resp:  [12-19] 15 (11/05 0624) BP: (115-156)/(52-81) 115/64 (11/05 0624) SpO2:  [92 %-100 %] 95 % (11/05 0624)  Intake/Output from previous day:  Intake/Output Summary (Last 24 hours) at 11/22/2022 0839 Last data filed at 11/22/2022 0600 Gross per 24 hour  Intake 1941.53 ml  Output 1750 ml  Net 191.53 ml     Intake/Output this shift: No intake/output data recorded.  Labs: Recent Labs    11/22/22 0334  HGB 8.5*   Recent Labs    11/22/22 0334  WBC 6.5  RBC 3.16*  HCT 28.4*  PLT 203   Recent Labs    11/22/22 0334  NA 139  K 4.0  CL 111  CO2 21*  BUN 15  CREATININE 0.82  GLUCOSE 127*  CALCIUM 8.5*   No results for input(s): "LABPT", "INR" in the last 72 hours.  Exam: General - Patient is Alert and Oriented Extremity - Neurologically intact Neurovascular intact Sensation intact distally Dorsiflexion/Plantar flexion intact Dressing - dressing C/D/I Motor Function - intact, moving foot and toes well on exam.   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  The Hereditary Gene Panel offered by [facility] includes sequencing and/or deletion duplication testing of the following 47 genes:  APC, ATM, AXIN2, BARD1, BMPR1A, BRCA1, BRCA2, BRIP1, CDH1, CDK4, CDKN2A (p14ARF), CDKN2A (p16INK4a), CHEK2, CTNNA1, DICER1, EPCAM (Deletion/duplication testing only), GREM1 (promoter region deletion/duplication   History of uterine cancer 03/14/2018   s/p hysterectomy   Hypokalemia 07/14/2021   Hypothyroidism    Impingement syndrome of right shoulder region 03/13/2021   Malignant neoplasm of upper-inner quadrant of left breast in female, estrogen receptor positive 02/12/2018   Metatarsalgia of right foot 03/13/2021   OAB (overactive bladder) 12/13/2021   Osteoarthritis of left knee 03/16/2018   Osteoporosis 12/28/2021   Pain in joint of right shoulder 03/13/2021   Pain in left knee 12/13/2017   Prolonged QT interval 07/14/2021   Sepsis due to undetermined organism 07/14/2021   Temporal arteritis    Urge incontinence    UTI (urinary tract infection) 07/14/2021    Assessment/Plan: 1 Day Post-Op Procedure(s) (LRB): TOTAL KNEE ARTHROPLASTY (Left) Principal Problem:   Osteoarthritis of left knee  Estimated body mass index is 26.45 kg/m as calculated from the following:   Height as of this encounter: 5' 3.5" (1.613 m).   Weight as of this encounter: 68.8 kg. Advance diet Up with therapy D/C IV fluids   Patient's anticipated LOS is less than 2 midnights, meeting these requirements: - Lives within 1 hour of care - Has a competent adult at home to recover with post-op recover - NO history of  - Chronic pain requiring opiods  - Diabetes  - Coronary  Artery Disease  - Heart failure  - Heart attack  - Stroke  - DVT/VTE  - Cardiac arrhythmia  - Respiratory Failure/COPD  - Renal failure  - Anemia  - Advanced Liver disease  DVT Prophylaxis - Aspirin Weight bearing as tolerated. Continue therapy.  Plan is to go Home after hospital stay. Plan for discharge later today if progresses with therapy and meeting goals. Scheduled [facility] OPPT at [facility]). Follow-up in the office  in 2 weeks.  The PDMP database was reviewed today prior to any opioid medications being prescribed to this patient.  Arther Abbott, PA-C Orthopedic Surgery [phone] 11/22/2022, 8:39 AM

## 2022-11-22 NOTE — Progress Notes (Signed)
Physical Therapy Treatment Patient Details Name: MAHEEN CWIKLA MRN: [id number] DOB: [birth year] Today's Date: 11/22/2022   History of Present Illness Pt is 82 yo female admitted on 11/21/22 for L TKA>  Pt with hx including but not limited to anemia, OP, OA, OAB    PT Comments  Pt is making steady progress. Spouse present for session, supportive. Will see for a second session and pt should be ready for d/c later today     If plan is discharge home, recommend the following: A little help with walking and/or transfers;A little help with bathing/dressing/bathroom;Assistance with cooking/housework;Help with stairs or ramp for entrance   Can travel by private vehicle        Equipment Recommendations  None recommended by PT    Recommendations for Other Services       Precautions / Restrictions Precautions Precautions: Fall;Knee Restrictions Weight Bearing Restrictions: No LLE Weight Bearing: Weight bearing as tolerated     Mobility  Bed Mobility Overal bed mobility: Needs Assistance Bed Mobility: Supine to Sit     Supine to sit: Supervision, HOB elevated          Transfers Overall transfer level: Needs assistance Equipment used: Rolling walker (2 wheels) Transfers: Sit to/from Stand Sit to Stand: Contact guard assist, Min assist           General transfer comment: cues for hand placement and L LE management with light min A and momentum to rise    Ambulation/Gait Ambulation/Gait assistance: Contact guard assist Gait Distance (Feet): 35 Feet Assistive device: Rolling walker (2 wheels) Gait Pattern/deviations: Step-to pattern, Decreased stride length, Decreased weight shift to left       General Gait Details: LImited due to pain with weight bearing, cues for sequencing and RW position   Stairs             Wheelchair Mobility     Tilt Bed    Modified Rankin (Stroke Patients Only)       Balance           Standing balance support: During  functional activity, Reliant on assistive device for balance Standing balance-Leahy Scale: Fair                              Cognition Arousal: Alert Behavior During Therapy: WFL for tasks assessed/performed Overall Cognitive Status: Within Functional Limits for tasks assessed                                          Exercises Total Joint Exercises Ankle Circles/Pumps: AROM, Both, 10 reps Quad Sets: AROM, Both, 10 reps Heel Slides: AAROM, Left, 10 reps Straight Leg Raises: AROM, Left, 10 reps    General Comments        Pertinent Vitals/Pain Pain Assessment Pain Assessment: 0-10 Pain Score: 5  Pain Location: L knee Pain Descriptors / Indicators: Discomfort, Sore Pain Intervention(s): Limited activity within patient's tolerance, Monitored during session, Premedicated before session, Repositioned, Ice applied    Home Living                          Prior Function            PT Goals (current goals can now be found in the care plan section) Acute Rehab PT Goals Patient Stated Goal: return  home PT Goal Formulation: With patient/family Time For Goal Achievement: 12/05/22 Potential to Achieve Goals: Good Progress towards PT goals: Progressing toward goals    Frequency    7X/week      PT Plan      Co-evaluation              AM-PAC PT "6 Clicks" Mobility   Outcome Measure  Help needed turning from your back to your side while in a flat bed without using bedrails?: A Little Help needed moving from lying on your back to sitting on the side of a flat bed without using bedrails?: A Little Help needed moving to and from a bed to a chair (including a wheelchair)?: A Little Help needed standing up from a chair using your arms (e.g., wheelchair or bedside chair)?: A Little Help needed to walk in hospital room?: A Little Help needed climbing 3-5 steps with a railing? : A Little 6 Click Score: 18    End of Session  Equipment Utilized During Treatment: Gait belt Activity Tolerance: Patient tolerated treatment well Patient left: with chair alarm set;with call bell/phone within reach;in chair;with family/visitor present   PT Visit Diagnosis: Other abnormalities of gait and mobility (R26.89);Muscle weakness (generalized) (M62.81)     Time: 4098-1191 PT Time Calculation (min) (ACUTE ONLY): 25 min  Charges:    $Gait Training: 8-22 mins $Therapeutic Exercise: 8-22 mins PT General Charges $$ ACUTE PT VISIT: 1 Visit                     Cecily Lawhorne, PT  Acute Rehab Dept [facility]) [phone]  11/22/2022    [facility] 11/22/2022, 10:37 AM

## 2022-11-22 NOTE — Progress Notes (Signed)
I have reviewed and concur with this student's documentation during med administration. CI was present during.  Leodis Sias, RN 11/22/2022 1:35 PM

## 2022-11-22 NOTE — TOC Transition Note (Signed)
Transition of Care [facility]) - CM/SW Discharge Note   Patient Details  Name: Kim Krueger MRN: [id number] Date of Birth: [birth year]  Transition of Care [facility]) CM/SW Contact:  Amada Jupiter, LCSW Phone Number: 11/22/2022, 1:00 PM   Clinical Narrative:     Met with pt who confirms she has needed DME in the home.  OPPT already arranged with [facility].  No further TOC needs.  Final next level of care: OP Rehab Barriers to Discharge: No Barriers Identified   Patient Goals and CMS Choice      Discharge Placement                         Discharge Plan and Services Additional resources added to the After Visit Summary for                  DME Arranged: N/A DME Agency: NA                  Social Determinants of Health (SDOH) Interventions SDOH Screenings   Food Insecurity: No Food Insecurity (11/21/2022)  Housing: Low Risk  (11/21/2022)  Transportation Needs: No Transportation Needs (11/21/2022)  Utilities: Not At Risk (11/21/2022)  Tobacco Use: Low Risk  (11/21/2022)     Readmission Risk Interventions     No data to display

## 2022-11-22 NOTE — Progress Notes (Signed)
PT TX NOTE   11/22/22 1400  PT Visit Information  Last PT Received On 11/22/22  Assistance Needed Pt continues to progress steadily. Husband present for session. Pt is ready to d/c from PT standpoint with husband assisting as needed.   History of Present Illness Pt is 82 yo female admitted on 11/21/22 for L TKA>  Pt with hx including but not limited to anemia, OP, OA, OAB  Subjective Data  Patient Stated Goal return home  Precautions  Precautions Fall;Knee  Restrictions  Weight Bearing Restrictions No  LLE Weight Bearing WBAT  Pain Assessment  Pain Assessment 0-10  Pain Score 5  Pain Location L knee  Pain Descriptors / Indicators Discomfort;Sore  Pain Intervention(s) Limited activity within patient's tolerance;Monitored during session;Premedicated before session;Repositioned  Cognition  Arousal Alert  Behavior During Therapy WFL for tasks assessed/performed  Overall Cognitive Status Within Functional Limits for tasks assessed  Bed Mobility  General bed mobility comments in recliner  Transfers  Overall transfer level Needs assistance  Equipment used Rolling walker (2 wheels)  Transfers Sit to/from Stand  Sit to Stand Contact guard assist;Min assist  General transfer comment cues for hand placement and L LE management with light min A to CGA to  rise; husband present, able to cue/provide light assist  Ambulation/Gait  Ambulation/Gait assistance Contact guard assist  Gait Distance (Feet) 55 Feet  Assistive device Rolling walker (2 wheels)  Gait Pattern/deviations Step-to pattern;Decreased stride length;Decreased weight shift to left  General Gait Details cues for sequencing and RW position  Stairs Yes  Stairs assistance Min assist;Contact guard assist  Stair Management One rail Right;Step to pattern;Forwards;With cane  Number of Stairs 3  General stair comments cues for sequence  and technique. husband present, able to assit and cue; no knee buckling or overt LOB  Balance   Standing balance support During functional activity;Reliant on assistive device for balance  Standing balance-Leahy Scale Fair  PT - End of Session  Equipment Utilized During Treatment Gait belt  Activity Tolerance Patient tolerated treatment well  Patient left with chair alarm set;with call bell/phone within reach;in chair;with family/visitor present   PT - Assessment/Plan  PT Visit Diagnosis Other abnormalities of gait and mobility (R26.89);Muscle weakness (generalized) (M62.81)  PT Frequency (ACUTE ONLY) 7X/week  Follow Up Recommendations Follow physician's recommendations for discharge plan and follow up therapies  Patient can return home with the following A little help with walking and/or transfers;A little help with bathing/dressing/bathroom;Assistance with cooking/housework;Help with stairs or ramp for entrance  PT equipment None recommended by PT  AM-PAC PT "6 Clicks" Mobility Outcome Measure (Version 2)  Help needed turning from your back to your side while in a flat bed without using bedrails? 3  Help needed moving from lying on your back to sitting on the side of a flat bed without using bedrails? 3  Help needed moving to and from a bed to a chair (including a wheelchair)? 3  Help needed standing up from a chair using your arms (e.g., wheelchair or bedside chair)? 3  Help needed to walk in hospital room? 3  Help needed climbing 3-5 steps with a railing?  3  6 Click Score 18  Consider Recommendation of Discharge To: Home with [facility]  PT Goal Progression  Progress towards PT goals Progressing toward goals  Acute Rehab PT Goals  PT Goal Formulation With patient/family  Time For Goal Achievement 12/05/22  Potential to Achieve Goals Good  PT Time Calculation  PT Start Time (ACUTE ONLY)  1335  PT Stop Time (ACUTE ONLY) 1402  PT Time Calculation (min) (ACUTE ONLY) 27 min  PT General Charges  $$ ACUTE PT VISIT 1 Visit  PT Treatments  $Gait Training 23-37 mins

## 2022-11-23 ENCOUNTER — Encounter (HOSPITAL_COMMUNITY): Payer: Self-pay | Admitting: Orthopedic Surgery

## 2022-11-23 NOTE — Discharge Summary (Signed)
Patient ID: CHIANTI GOH MRN: [id number] DOB/AGE: May 20, 1940 82 y.o.  Admit date: 11/21/2022 Discharge date: 11/22/2022  Admission Diagnoses:  Principal Problem:   Osteoarthritis of left knee   Discharge Diagnoses:  Same  Past Medical History:  Diagnosis Date   Acute metabolic encephalopathy 07/14/2021   Amnestic MCI (mild cognitive impairment with memory loss) 04/13/2022   Family history of colon cancer    Family history of ovarian cancer    Genetic testing 03/30/2018   DICER1 VUS identified on the common hereditary cancer panel.  The Hereditary Gene Panel offered by [facility] includes sequencing and/or deletion duplication testing of the following 47 genes: APC, ATM, AXIN2, BARD1, BMPR1A, BRCA1, BRCA2, BRIP1, CDH1, CDK4, CDKN2A (p14ARF), CDKN2A (p16INK4a), CHEK2, CTNNA1, DICER1, EPCAM (Deletion/duplication testing only), GREM1 (promoter region deletion/duplication   History of uterine cancer 03/14/2018   s/p hysterectomy   Hypokalemia 07/14/2021   Hypothyroidism    Impingement syndrome of right shoulder region 03/13/2021   Malignant neoplasm of upper-inner quadrant of left breast in female, estrogen receptor positive 02/12/2018   Metatarsalgia of right foot 03/13/2021   OAB (overactive bladder) 12/13/2021   Osteoarthritis of left knee 03/16/2018   Osteoporosis 12/28/2021   Pain in joint of right shoulder 03/13/2021   Pain in left knee 12/13/2017   Prolonged QT interval 07/14/2021   Sepsis due to undetermined organism 07/14/2021   Temporal arteritis    Urge incontinence    UTI (urinary tract infection) 07/14/2021    Surgeries: Procedure(s): TOTAL KNEE ARTHROPLASTY on 11/21/2022   Consultants:   Discharged Condition: Improved  Hospital Course: JENNESSY SANDRIDGE is an 82 y.o. female who was admitted 11/21/2022 for operative treatment ofOsteoarthritis of left knee. Patient has severe unremitting pain that affects sleep, daily activities, and work/hobbies. After pre-op  clearance the patient was taken to the operating room on 11/21/2022 and underwent  Procedure(s): TOTAL KNEE ARTHROPLASTY.    Patient was given perioperative antibiotics:  Anti-infectives (From admission, onward)    Start     Dose/Rate Route Frequency Ordered Stop   11/21/22 2200  nitrofurantoin (MACRODANTIN) capsule 50 mg  Status:  Discontinued        50 mg Oral Daily at bedtime 11/21/22 1021 11/22/22 2148   11/21/22 1330  ceFAZolin (ANCEF) IVPB 2g/100 mL premix        2 g 200 mL/hr over 30 Minutes Intravenous Every 6 hours 11/21/22 1021 11/21/22 2219   11/21/22 0600  ceFAZolin (ANCEF) IVPB 2g/100 mL premix        2 g 200 mL/hr over 30 Minutes Intravenous On call to O.R. 11/21/22 0531 11/21/22 0720   11/21/22 0600  vancomycin (VANCOCIN) IVPB 1000 mg/200 mL premix        1,000 mg 200 mL/hr over 60 Minutes Intravenous  Once 11/21/22 0531 11/21/22 0820        Patient was given sequential compression devices, early ambulation, and chemoprophylaxis to prevent DVT.  Patient benefited maximally from hospital stay and there were no complications.    Recent vital signs: Patient Vitals for the past 24 hrs:  BP Temp Temp src Pulse Resp  11/22/22 1422 128/76 99 F (37.2 C) Oral 74 17     Recent laboratory studies:  Recent Labs    11/22/22 0334  WBC 6.5  HGB 8.5*  HCT 28.4*  PLT 203  NA 139  K 4.0  CL 111  CO2 21*  BUN 15  CREATININE 0.82  GLUCOSE 127*  CALCIUM 8.5*     Discharge  Medications:   Allergies as of 11/22/2022       Reactions   Ceftin [cefuroxime Axetil] Nausea Only   Caused severe yeast infection Tolerated Cephalosporin Date: 11/22/22.   Hydrocodone    Cause pain to worsen    Solifenacin    Altered mental state   Sulfa Antibiotics Other (See Comments)   Unknown childhood reaction after surgery    Tramadol Other (See Comments)   Cause pain to worsen    Penicillins Rash           Medication List     STOP taking these medications    ASPERCREME  LIDOCAINE EX   ibuprofen 200 MG tablet Commonly known as: ADVIL       TAKE these medications    acetaminophen 500 MG tablet Commonly known as: TYLENOL Take 1,000 mg by mouth 2 (two) times daily.   anastrozole 1 MG tablet Commonly known as: ARIMIDEX TAKE 1 TABLET(1 MG) BY MOUTH DAILY   aspirin 81 MG chewable tablet Chew 1 tablet (81 mg total) by mouth 2 (two) times daily for 20 days. Then take one 81 mg aspirin once a day for three weeks. Then discontinue aspirin.   chlorhexidine 4 % external liquid Commonly known as: HIBICLENS Apply 15 mLs (1 Application total) topically as directed for 30 doses. Use as directed daily for 5 days every other week for 6 weeks.   gabapentin 300 MG capsule Commonly known as: NEURONTIN Take a 300 mg capsule three times a day for two weeks following surgery.Then take a 300 mg capsule two times a day for two weeks. Then take a 300 mg capsule once a day for two weeks. Then discontinue.   levothyroxine 88 MCG tablet Commonly known as: SYNTHROID Take 1 tablet by mouth daily.   memantine 5 MG tablet Commonly known as: NAMENDA Take 1 tablet twice a day   methocarbamol 500 MG tablet Commonly known as: ROBAXIN Take 1 tablet (500 mg total) by mouth every 6 (six) hours as needed for muscle spasms.   mupirocin ointment 2 % Commonly known as: BACTROBAN Place 1 Application into the nose 2 (two) times daily for 60 doses. Use as directed 2 times daily for 5 days every other week for 6 weeks.   nitrofurantoin 50 MG capsule Commonly known as: Macrodantin Take 1 capsule (50 mg total) by mouth at bedtime.   oxyCODONE 5 MG immediate release tablet Commonly known as: Oxy IR/ROXICODONE Take 1-2 tablets (5-10 mg total) by mouth every 6 (six) hours as needed for severe pain (pain score 7-10).               Discharge Care Instructions  (From admission, onward)           Start     Ordered   11/22/22 0000  Weight bearing as tolerated         11/22/22 0841   11/22/22 0000  Change dressing       Comments: You may remove the bulky bandage (ACE wrap and gauze) two days after surgery. You will have an adhesive waterproof bandage underneath. Leave this in place until your first follow-up appointment.   11/22/22 0841            Diagnostic Studies: MM 3D SCREENING MAMMOGRAM BILATERAL BREAST  Result Date: 11/15/2022 CLINICAL DATA:  Screening. EXAM: DIGITAL SCREENING BILATERAL MAMMOGRAM WITH TOMOSYNTHESIS AND CAD TECHNIQUE: Bilateral screening digital craniocaudal and mediolateral oblique mammograms were obtained. Bilateral screening digital breast tomosynthesis was performed. The images  were evaluated with computer-aided detection. COMPARISON:  Previous exam(s). ACR Breast Density Category b: There are scattered areas of fibroglandular density. FINDINGS: In the left breast, calcifications warrant further evaluation. In the right breast, no findings suspicious for malignancy. IMPRESSION: Further evaluation is suggested for calcifications in the left breast. RECOMMENDATION: Diagnostic mammogram of the left breast. (Code:FI-L-60M) The patient will be contacted regarding the findings, and additional imaging will be scheduled. BI-RADS CATEGORY  0: Incomplete: Need additional imaging evaluation. Electronically Signed   By: Edwin Cap M.D.   On: 11/15/2022 08:15    Disposition: Discharge disposition: 01-Home or Self Care       Discharge Instructions     Call MD / Call 911   Complete by: As directed    If you experience chest pain or shortness of breath, CALL 911 and be transported to the hospital emergency room.  If you develope a fever above 101 F, pus (white drainage) or increased drainage or redness at the wound, or calf pain, call your surgeon's office.   Change dressing   Complete by: As directed    You may remove the bulky bandage (ACE wrap and gauze) two days after surgery. You will have an adhesive waterproof bandage  underneath. Leave this in place until your first follow-up appointment.   Constipation Prevention   Complete by: As directed    Drink plenty of fluids.  Prune juice may be helpful.  You may use a stool softener, such as Colace (over the counter) 100 mg twice a day.  Use MiraLax (over the counter) for constipation as needed.   Diet - low sodium heart healthy   Complete by: As directed    Do not put a pillow under the knee. Place it under the heel.   Complete by: As directed    Driving restrictions   Complete by: As directed    No driving for two weeks   Post-operative opioid taper instructions:   Complete by: As directed    POST-OPERATIVE OPIOID TAPER INSTRUCTIONS: It is important to wean off of your opioid medication as soon as possible. If you do not need pain medication after your surgery it is ok to stop day one. Opioids include: Codeine, Hydrocodone(Norco, Vicodin), Oxycodone(Percocet, oxycontin) and hydromorphone amongst others.  Long term and even short term use of opiods can cause: Increased pain response Dependence Constipation Depression Respiratory depression And more.  Withdrawal symptoms can include Flu like symptoms Nausea, vomiting And more Techniques to manage these symptoms Hydrate well Eat regular healthy meals Stay active Use relaxation techniques(deep breathing, meditating, yoga) Do Not substitute Alcohol to help with tapering If you have been on opioids for less than two weeks and do not have pain than it is ok to stop all together.  Plan to wean off of opioids This plan should start within one week post op of your joint replacement. Maintain the same interval or time between taking each dose and first decrease the dose.  Cut the total daily intake of opioids by one tablet each day Next start to increase the time between doses. The last dose that should be eliminated is the evening dose.      TED hose   Complete by: As directed    Use stockings (TED  hose) for three weeks on both leg(s).  You may remove them at night for sleeping.   Weight bearing as tolerated   Complete by: As directed         Follow-up Information  Ollen Gross, MD. Schedule an appointment as soon as possible for a visit in 2 week(s).   Specialty: Orthopedic Surgery Contact information: [address] [place] [address] [place] [address] [phone]                  Signed: Arther Abbott 11/23/2022, 12:14 PM

## 2022-11-28 ENCOUNTER — Ambulatory Visit: Payer: Medicare Other | Admitting: Urology

## 2022-12-07 ENCOUNTER — Telehealth: Payer: Self-pay | Admitting: Urology

## 2022-12-07 NOTE — Telephone Encounter (Signed)
Patient called she had knee surgery and they put her on a bunch of medication and the medication gave her has given her a raging UTI

## 2022-12-07 NOTE — Telephone Encounter (Signed)
Please offer her 1130 with Maralyn Sago tomorrow.

## 2022-12-26 ENCOUNTER — Encounter: Payer: Self-pay | Admitting: Urology

## 2022-12-26 ENCOUNTER — Ambulatory Visit (INDEPENDENT_AMBULATORY_CARE_PROVIDER_SITE_OTHER): Payer: Medicare Other | Admitting: Urology

## 2022-12-26 VITALS — BP 148/79 | HR 72

## 2022-12-26 DIAGNOSIS — N390 Urinary tract infection, site not specified: Secondary | ICD-10-CM

## 2022-12-26 DIAGNOSIS — R3915 Urgency of urination: Secondary | ICD-10-CM | POA: Diagnosis not present

## 2022-12-26 DIAGNOSIS — R350 Frequency of micturition: Secondary | ICD-10-CM | POA: Diagnosis not present

## 2022-12-26 DIAGNOSIS — R300 Dysuria: Secondary | ICD-10-CM

## 2022-12-26 LAB — URINALYSIS, ROUTINE W REFLEX MICROSCOPIC
Bilirubin, UA: NEGATIVE
Glucose, UA: NEGATIVE
Ketones, UA: NEGATIVE
Nitrite, UA: NEGATIVE
Specific Gravity, UA: 1.025 (ref 1.005–1.030)
Urobilinogen, Ur: 0.2 mg/dL (ref 0.2–1.0)
pH, UA: 6 (ref 5.0–7.5)

## 2022-12-26 LAB — MICROSCOPIC EXAMINATION: RBC, Urine: >30 /[HPF] — AB (ref 0–2)

## 2022-12-26 MED ORDER — DOXYCYCLINE HYCLATE 100 MG PO CAPS: 100.0000 mg | ORAL_CAPSULE | Freq: Two times a day (BID) | ORAL | 0 refills | Status: DC

## 2022-12-26 MED ORDER — NITROFURANTOIN MACROCRYSTAL 50 MG PO CAPS: 50.0000 mg | ORAL_CAPSULE | Freq: Every day | ORAL | 3 refills | Status: AC

## 2022-12-26 NOTE — Progress Notes (Signed)
12/26/2022 11:37 AM   ROBI EAVEY [birth year] [id number]  Referring provider: Gaspar Garbe, MD [address] [place],  [place] [address]  Concern for UTI   HPI: Kim Krueger is a 82yo here for evaluation of UTI. UA shows >30 RBCs, 6-10 WBCs and few bacteria. She underwent knee replacement nov 4th and had a foley at that time. 1 week ago she developed dysuria, urinary urgency and frequency. She has new nocturia 5-6x. She has been taking AZO. She is on macrodantin prophylaxis.    PMH: Past Medical History:  Diagnosis Date   Acute metabolic encephalopathy 07/14/2021   Amnestic MCI (mild cognitive impairment with memory loss) 04/13/2022   Family history of colon cancer    Family history of ovarian cancer    Genetic testing 03/30/2018   DICER1 VUS identified on the common hereditary cancer panel.  The Hereditary Gene Panel offered by [facility] includes sequencing and/or deletion duplication testing of the following 47 genes: APC, ATM, AXIN2, BARD1, BMPR1A, BRCA1, BRCA2, BRIP1, CDH1, CDK4, CDKN2A (p14ARF), CDKN2A (p16INK4a), CHEK2, CTNNA1, DICER1, EPCAM (Deletion/duplication testing only), GREM1 (promoter region deletion/duplication   History of uterine cancer 03/14/2018   s/p hysterectomy   Hypokalemia 07/14/2021   Hypothyroidism    Impingement syndrome of right shoulder region 03/13/2021   Malignant neoplasm of upper-inner quadrant of left breast in female, estrogen receptor positive 02/12/2018   Metatarsalgia of right foot 03/13/2021   OAB (overactive bladder) 12/13/2021   Osteoarthritis of left knee 03/16/2018   Osteoporosis 12/28/2021   Pain in joint of right shoulder 03/13/2021   Pain in left knee 12/13/2017   Prolonged QT interval 07/14/2021   Sepsis due to undetermined organism 07/14/2021   Temporal arteritis    Urge incontinence    UTI (urinary tract infection) 07/14/2021    Surgical History: Past Surgical History:  Procedure Laterality Date   ABDOMINAL  HYSTERECTOMY     ovaries removed as well   APPENDECTOMY     appendix ruptured as child, still have part of appendix   ARTERY BIOPSY Right 10/19/2015   Procedure: RIGHT TEMPORAL ARTERY BIOPSY;  Surgeon: Jimmye Norman, MD;  Location: [facility];  Service: General;  Laterality: Right;   BREAST LUMPECTOMY WITH RADIOACTIVE SEED LOCALIZATION Left 02/28/2018   Procedure: LEFT BREAST LUMPECTOMY WITH RADIOACTIVE SEED LOCALIZATION;  Surgeon: Glenna Fellows, MD;  Location: [facility];  Service: General;  Laterality: Left;   HERNIA REPAIR  03/2016   umbilical   INSERTION OF MESH N/A 03/28/2016   Procedure: INSERTION OF MESH;  Surgeon: Jimmye Norman, MD;  Location: [facility];  Service: General;  Laterality: N/A;   TOTAL KNEE ARTHROPLASTY Left 11/21/2022   Procedure: TOTAL KNEE ARTHROPLASTY;  Surgeon: Ollen Gross, MD;  Location: [facility];  Service: Orthopedics;  Laterality: Left;   UMBILICAL HERNIA REPAIR N/A 03/28/2016   Procedure: LAPAROSCOPIC UMBILICAL HERNIA;  Surgeon: Jimmye Norman, MD;  Location: [facility];  Service: General;  Laterality: N/A;    Home Medications:  Allergies as of 12/26/2022       Reactions   Ceftin [cefuroxime Axetil] Nausea Only   Caused severe yeast infection Tolerated Cephalosporin Date: 11/22/22.   Hydrocodone    Cause pain to worsen    Solifenacin    Altered mental state   Sulfa Antibiotics Other (See Comments)   Unknown childhood reaction after surgery    Tramadol Other (See Comments)   Cause pain to worsen    Penicillins Rash  Medication List        Accurate as of December 26, 2022 11:37 AM. If you have any questions, ask your nurse or doctor.          acetaminophen 500 MG tablet Commonly known as: TYLENOL Take 1,000 mg by mouth 2 (two) times daily.   anastrozole 1 MG tablet Commonly known as: ARIMIDEX TAKE 1 TABLET(1 MG) BY MOUTH DAILY   chlorhexidine 4 % external liquid Commonly known as: HIBICLENS Apply 15 mLs (1  Application total) topically as directed for 30 doses. Use as directed daily for 5 days every other week for 6 weeks.   gabapentin 300 MG capsule Commonly known as: NEURONTIN Take a 300 mg capsule three times a day for two weeks following surgery.Then take a 300 mg capsule two times a day for two weeks. Then take a 300 mg capsule once a day for two weeks. Then discontinue.   levothyroxine 88 MCG tablet Commonly known as: SYNTHROID Take 1 tablet by mouth daily.   memantine 5 MG tablet Commonly known as: NAMENDA Take 1 tablet twice a day   methocarbamol 500 MG tablet Commonly known as: ROBAXIN Take 1 tablet (500 mg total) by mouth every 6 (six) hours as needed for muscle spasms.   nitrofurantoin 50 MG capsule Commonly known as: Macrodantin Take 1 capsule (50 mg total) by mouth at bedtime.   oxyCODONE 5 MG immediate release tablet Commonly known as: Oxy IR/ROXICODONE Take 1-2 tablets (5-10 mg total) by mouth every 6 (six) hours as needed for severe pain (pain score 7-10).        Allergies:  Allergies  Allergen Reactions   Ceftin [Cefuroxime Axetil] Nausea Only    Caused severe yeast infection Tolerated Cephalosporin Date: 11/22/22.     Hydrocodone     Cause pain to worsen    Solifenacin     Altered mental state   Sulfa Antibiotics Other (See Comments)    Unknown childhood reaction after surgery    Tramadol Other (See Comments)    Cause pain to worsen    Penicillins Rash         Family History: Family History  Problem Relation Age of Onset   Colon cancer Mother 76       d. 72   Heart disease Father    Dementia Maternal Grandmother    Alzheimer's disease Maternal Grandmother    Dementia Maternal Uncle    Alzheimer's disease Maternal Uncle    Ovarian cancer Cousin        died at 26; pat first cousin   Alzheimer's disease Cousin    Dementia Cousin 2       mat first cousin    Social History:  reports that she has never smoked. She has never used smokeless  tobacco. She reports that she does not drink alcohol and does not use drugs.  ROS: All other review of systems were reviewed and are negative except what is noted above in HPI  Physical Exam: BP (!) 148/79   Pulse 72   Constitutional:  Alert and oriented, No acute distress. HEENT: [place] AT, moist mucus membranes.  Trachea midline, no masses. Cardiovascular: No clubbing, cyanosis, or edema. Respiratory: Normal respiratory effort, no increased work of breathing. GI: Abdomen is soft, nontender, nondistended, no abdominal masses GU: No CVA tenderness.  Lymph: No cervical or inguinal lymphadenopathy. Skin: No rashes, bruises or suspicious lesions. Neurologic: Grossly intact, no focal deficits, moving all 4 extremities. Psychiatric: Normal mood and affect.  Laboratory Data: Lab Results  Component Value Date   WBC 6.5 11/22/2022   HGB 8.5 (L) 11/22/2022   HCT 28.4 (L) 11/22/2022   MCV 89.9 11/22/2022   PLT 203 11/22/2022    Lab Results  Component Value Date   CREATININE 0.82 11/22/2022    No results found for: "PSA"  No results found for: "TESTOSTERONE"  No results found for: "HGBA1C"  Urinalysis    Component Value Date/Time   COLORURINE YELLOW 07/13/2021 2020   APPEARANCEUR Clear 08/12/2022 1040   LABSPEC 1.012 07/13/2021 2020   PHURINE 6.0 07/13/2021 2020   GLUCOSEU Negative 08/12/2022 1040   HGBUR MODERATE (A) 07/13/2021 2020   BILIRUBINUR Negative 08/12/2022 1040   KETONESUR 20 (A) 07/13/2021 2020   PROTEINUR Negative 08/12/2022 1040   PROTEINUR 30 (A) 07/13/2021 2020   NITRITE Negative 08/12/2022 1040   NITRITE NEGATIVE 07/13/2021 2020   LEUKOCYTESUR 3+ (A) 08/12/2022 1040   LEUKOCYTESUR SMALL (A) 07/13/2021 2020    Lab Results  Component Value Date   LABMICR See below: 08/12/2022   WBCUA >30 (A) 08/12/2022   LABEPIT 0-10 08/12/2022   MUCUS Present (A) 11/11/2021   BACTERIA Many (A) 08/12/2022    Pertinent Imaging:  No results found for this or any  previous visit.  No results found for this or any previous visit.  No results found for this or any previous visit.  No results found for this or any previous visit.  No results found for this or any previous visit.  No valid procedures specified. No results found for this or any previous visit.  No results found for this or any previous visit.   Assessment & Plan:    1. Recurrent UTI -urine for culture -doxycycline 100mg  BID for 7 days Patient to restart macrobid after she finishes course of doxycycline - Urinalysis, Routine w reflex microscopic   No follow-ups on file.  Wilkie Aye, MD  [facility] [place]

## 2022-12-26 NOTE — Patient Instructions (Signed)
Urinary Tract Infection, Adult  A urinary tract infection (UTI) is an infection of any part of the urinary tract. The urinary tract includes the kidneys, ureters, bladder, and urethra. These organs make, store, and get rid of urine in the body. An upper UTI affects the ureters and kidneys. A lower UTI affects the bladder and urethra. What are the causes? Most urinary tract infections are caused by bacteria in your genital area around your urethra, where urine leaves your body. These bacteria grow and cause inflammation of your urinary tract. What increases the risk? You are more likely to develop this condition if: You have a urinary catheter that stays in place. You are not able to control when you urinate or have a bowel movement (incontinence). You are female and you: Use a spermicide or diaphragm for birth control. Have low estrogen levels. Are pregnant. You have certain genes that increase your risk. You are sexually active. You take antibiotic medicines. You have a condition that causes your flow of urine to slow down, such as: An enlarged prostate, if you are female. Blockage in your urethra. A kidney stone. A nerve condition that affects your bladder control (neurogenic bladder). Not getting enough to drink, or not urinating often. You have certain medical conditions, such as: Diabetes. A weak disease-fighting system (immunesystem). Sickle cell disease. Gout. Spinal cord injury. What are the signs or symptoms? Symptoms of this condition include: Needing to urinate right away (urgency). Frequent urination. This may include small amounts of urine each time you urinate. Pain or burning with urination. Blood in the urine. Urine that smells bad or unusual. Trouble urinating. Cloudy urine. Vaginal discharge, if you are female. Pain in the abdomen or the lower back. You may also have: Vomiting or a decreased appetite. Confusion. Irritability or tiredness. A fever or  chills. Diarrhea. The first symptom in older adults may be confusion. In some cases, they may not have any symptoms until the infection has worsened. How is this diagnosed? This condition is diagnosed based on your medical history and a physical exam. You may also have other tests, including: Urine tests. Blood tests. Tests for STIs (sexually transmitted infections). If you have had more than one UTI, a cystoscopy or imaging studies may be done to determine the cause of the infections. How is this treated? Treatment for this condition includes: Antibiotic medicine. Over-the-counter medicines to treat discomfort. Drinking enough water to stay hydrated. If you have frequent infections or have other conditions such as a kidney stone, you may need to see a health care provider who specializes in the urinary tract (urologist). In rare cases, urinary tract infections can cause sepsis. Sepsis is a life-threatening condition that occurs when the body responds to an infection. Sepsis is treated in the hospital with IV antibiotics, fluids, and other medicines. Follow these instructions at home:  Medicines Take over-the-counter and prescription medicines only as told by your health care provider. If you were prescribed an antibiotic medicine, take it as told by your health care provider. Do not stop using the antibiotic even if you start to feel better. General instructions Make sure you: Empty your bladder often and completely. Do not hold urine for long periods of time. Empty your bladder after sex. Wipe from front to back after urinating or having a bowel movement if you are female. Use each tissue only one time when you wipe. Drink enough fluid to keep your urine pale yellow. Keep all follow-up visits. This is important. Contact a health   care provider if: Your symptoms do not get better after 1-2 days. Your symptoms go away and then return. Get help right away if: You have severe pain in  your back or your lower abdomen. You have a fever or chills. You have nausea or vomiting. Summary A urinary tract infection (UTI) is an infection of any part of the urinary tract, which includes the kidneys, ureters, bladder, and urethra. Most urinary tract infections are caused by bacteria in your genital area. Treatment for this condition often includes antibiotic medicines. If you were prescribed an antibiotic medicine, take it as told by your health care provider. Do not stop using the antibiotic even if you start to feel better. Keep all follow-up visits. This is important. This information is not intended to replace advice given to you by your health care provider. Make sure you discuss any questions you have with your health care provider. Document Revised: 08/11/2019 Document Reviewed: 08/16/2019 Elsevier Patient Education  2024 [employer]  

## 2022-12-28 LAB — URINE CULTURE

## 2023-01-02 NOTE — Progress Notes (Signed)
Letter sent.

## 2023-01-05 ENCOUNTER — Telehealth: Payer: Self-pay | Admitting: Urology

## 2023-01-05 NOTE — Telephone Encounter (Signed)
Patient states she has been off the doxycycline for 5 days and the burning is and pain at the beginning of the stream is getting worse.  Patient requested a different abt be called in.  Patient made aware that last culture was negative and that she would need to come in and be seen.  Appointment offered and accepted.

## 2023-01-05 NOTE — Telephone Encounter (Signed)
Patient finished doxycyline on Monday and she is still having the symptoms of severe burning when urinating.

## 2023-01-06 ENCOUNTER — Telehealth: Payer: Self-pay

## 2023-01-06 ENCOUNTER — Other Ambulatory Visit: Payer: Self-pay | Admitting: Urology

## 2023-01-06 ENCOUNTER — Ambulatory Visit (INDEPENDENT_AMBULATORY_CARE_PROVIDER_SITE_OTHER): Payer: Medicare Other | Admitting: Urology

## 2023-01-06 ENCOUNTER — Encounter: Payer: Self-pay | Admitting: Urology

## 2023-01-06 VITALS — BP 132/84 | HR 75

## 2023-01-06 DIAGNOSIS — Z87440 Personal history of urinary (tract) infections: Secondary | ICD-10-CM

## 2023-01-06 DIAGNOSIS — N3281 Overactive bladder: Secondary | ICD-10-CM

## 2023-01-06 DIAGNOSIS — D638 Anemia in other chronic diseases classified elsewhere: Secondary | ICD-10-CM | POA: Insufficient documentation

## 2023-01-06 DIAGNOSIS — R8290 Unspecified abnormal findings in urine: Secondary | ICD-10-CM | POA: Diagnosis not present

## 2023-01-06 DIAGNOSIS — E559 Vitamin D deficiency, unspecified: Secondary | ICD-10-CM | POA: Insufficient documentation

## 2023-01-06 DIAGNOSIS — R399 Unspecified symptoms and signs involving the genitourinary system: Secondary | ICD-10-CM | POA: Diagnosis not present

## 2023-01-06 DIAGNOSIS — N905 Atrophy of vulva: Secondary | ICD-10-CM | POA: Diagnosis not present

## 2023-01-06 DIAGNOSIS — N390 Urinary tract infection, site not specified: Secondary | ICD-10-CM | POA: Insufficient documentation

## 2023-01-06 DIAGNOSIS — F0280 Dementia in other diseases classified elsewhere without behavioral disturbance: Secondary | ICD-10-CM | POA: Insufficient documentation

## 2023-01-06 LAB — BLADDER SCAN AMB NON-IMAGING: Scan Result: 25

## 2023-01-06 LAB — URINALYSIS, ROUTINE W REFLEX MICROSCOPIC
Bilirubin, UA: NEGATIVE
Bilirubin, UA: NEGATIVE
Glucose, UA: NEGATIVE
Glucose, UA: NEGATIVE
Ketones, UA: NEGATIVE
Ketones, UA: NEGATIVE
Nitrite, UA: POSITIVE — AB
Nitrite, UA: POSITIVE — AB
Protein,UA: NEGATIVE
Specific Gravity, UA: 1.02 (ref 1.005–1.030)
Specific Gravity, UA: 1.02 (ref 1.005–1.030)
Urobilinogen, Ur: 0.2 mg/dL (ref 0.2–1.0)
Urobilinogen, Ur: 0.2 mg/dL (ref 0.2–1.0)
pH, UA: 7 (ref 5.0–7.5)
pH, UA: 7.5 (ref 5.0–7.5)

## 2023-01-06 LAB — MICROSCOPIC EXAMINATION
WBC, UA: >30 /[HPF] — AB (ref 0–5)
WBC, UA: >30 /[HPF] — AB (ref 0–5)

## 2023-01-06 MED ORDER — NITROFURANTOIN MONOHYD MACRO 100 MG PO CAPS: 100.0000 mg | ORAL_CAPSULE | Freq: Two times a day (BID) | ORAL | 0 refills | Status: DC

## 2023-01-06 MED ORDER — FOSFOMYCIN TROMETHAMINE 3 G PO PACK: 3.0000 g | PACK | Freq: Once | ORAL | 0 refills | Status: AC

## 2023-01-06 NOTE — Progress Notes (Signed)
Name: MIKALAH ROLLS DOB: August 30, 1940 MRN: [id number]  History of Present Illness: Ms. Beckstead is a 82 y.o. female who presents today for follow up visit at [facility] [place].  - GU history: 1. OAB with urinary frequency, urgency, and urge incontinence. - Previously failed Myrbetriq and Gemtesa.  2. Recurrent UTIs.  - Taking Nitrofurantoin 50 mg daily for UTI prophylaxis. 3. Vaginal atrophy. 4. History of uterine cancer s/p hysterectomy.  Urine culture results in past 12 months: - 08/12/2022: Positive for Klebsiella pneumoniae - 12/26/2022: Negative  Recent history: > 08/12/2022: Seen at urology clinic by Purcell Nails, NP. - In addition to acute UTI treatment and continued daily prophylactic antibiotic she was advised to start topical vaginal estrogen cream use 2x/week at bedtime.   > 11/21/2022: Underwent left knee replacement; had a foley at that time.   > 12/26/2022: Seen at urology clinic by Dr. Ronne Binning. - Reported dysuria, urgency, frequency, and new nocturia 5-6x for 1 week.  - Doxycycline 100mg  BID for 7 days for suspected UTI.  - Urine sent for culture (came back negative).  Today: She reports dysuria; completed Doxycycline 6 days ago. She reports that the dysuria improved somewhat while on Doxycycline but within 3 days after completion of that antibiotic the dysuria started to gradually worsen.   She reports mildly increased urinary urgency and frequency above her baseline. Denies gross hematuria, hesitancy, straining to void, sensations of incomplete emptying, flank pain, abdominal pain, fevers, nausea, or vomiting.  She denies vaginal pain, itching, bleeding, or abnormal discharge. She never started using the topical vaginal estrogen cream which was prescribed in July 2024 because her pharmacist said she couldn't use that due to her history of breast cancer and uterine cancer. She denies discussing topical vaginal estrogen cream use with her oncologist or  being instructed by her oncologist not to use the topical vaginal estrogen cream; she states she has an upcoming appointment with her oncologist in the near future.    Fall Screening: Do you usually have a device to assist in your mobility? No   Medications: Current Outpatient Medications  Medication Sig Dispense Refill   acetaminophen (TYLENOL) 500 MG tablet Take 1,000 mg by mouth 2 (two) times daily.     anastrozole (ARIMIDEX) 1 MG tablet TAKE 1 TABLET(1 MG) BY MOUTH DAILY 90 tablet 3   chlorhexidine (HIBICLENS) 4 % external liquid Apply 15 mLs (1 Application total) topically as directed for 30 doses. Use as directed daily for 5 days every other week for 6 weeks. 946 mL 1   fosfomycin (MONUROL) 3 g PACK Take 3 g by mouth once for 1 dose. 3 g 0   gabapentin (NEURONTIN) 300 MG capsule Take a 300 mg capsule three times a day for two weeks following surgery.Then take a 300 mg capsule two times a day for two weeks. Then take a 300 mg capsule once a day for two weeks. Then discontinue. 84 capsule 0   levothyroxine (SYNTHROID) 88 MCG tablet Take 1 tablet by mouth daily.     memantine (NAMENDA) 5 MG tablet Take 1 tablet twice a day 180 tablet 3   nitrofurantoin (MACRODANTIN) 50 MG capsule Take 1 capsule (50 mg total) by mouth at bedtime. 90 capsule 3   oxyCODONE (OXY IR/ROXICODONE) 5 MG immediate release tablet Take 1-2 tablets (5-10 mg total) by mouth every 6 (six) hours as needed for severe pain (pain score 7-10). 42 tablet 0   methocarbamol (ROBAXIN) 500 MG tablet Take 1 tablet (  500 mg total) by mouth every 6 (six) hours as needed for muscle spasms. (Patient not taking: Reported on 01/06/2023) 40 tablet 0   No current facility-administered medications for this visit.    Allergies: Allergies  Allergen Reactions   Ceftin [Cefuroxime Axetil] Nausea Only    Caused severe yeast infection Tolerated Cephalosporin Date: 11/22/22.     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  The Hereditary Gene Panel offered by [facility] includes sequencing and/or deletion duplication testing of the following 47 genes: APC, ATM, AXIN2, BARD1, BMPR1A, BRCA1, BRCA2, BRIP1, CDH1, CDK4, CDKN2A (p14ARF), CDKN2A (p16INK4a), CHEK2, CTNNA1, DICER1, EPCAM (Deletion/duplication testing only), GREM1 (promoter region deletion/duplication   History of uterine cancer 03/14/2018   s/p hysterectomy   Hypokalemia 07/14/2021   Hypothyroidism    Impingement syndrome of right shoulder region 03/13/2021   Malignant neoplasm of upper-inner quadrant of left breast in female, estrogen receptor positive 02/12/2018   Metatarsalgia of right foot 03/13/2021   OAB (overactive bladder) 12/13/2021   Osteoarthritis of left knee 03/16/2018   Osteoporosis 12/28/2021   Pain in joint of right shoulder 03/13/2021   Pain in left knee 12/13/2017   Prolonged QT interval 07/14/2021   Sepsis due to undetermined organism 07/14/2021   Temporal arteritis    Urge incontinence    UTI (urinary tract infection) 07/14/2021   Past Surgical History:  Procedure Laterality Date   ABDOMINAL HYSTERECTOMY     ovaries removed as well   APPENDECTOMY     appendix ruptured as child, still have part of appendix   ARTERY BIOPSY Right 10/19/2015   Procedure: RIGHT TEMPORAL ARTERY BIOPSY;  Surgeon: Jimmye Norman, MD;  [facility];  Service: General;  Laterality: Right;   BREAST LUMPECTOMY WITH RADIOACTIVE  SEED LOCALIZATION Left 02/28/2018   Procedure: LEFT BREAST LUMPECTOMY WITH RADIOACTIVE SEED LOCALIZATION;  Surgeon: Glenna Fellows, MD;  Location: [facility];  Service: General;  Laterality: Left;   HERNIA REPAIR  03/2016   umbilical   INSERTION OF MESH N/A 03/28/2016   Procedure: INSERTION OF MESH;  Surgeon: Jimmye Norman, MD;  Location: [facility];  Service: General;  Laterality: N/A;   TOTAL KNEE ARTHROPLASTY Left 11/21/2022   Procedure: TOTAL KNEE ARTHROPLASTY;  Surgeon: Ollen Gross, MD;  Location: [facility];  Service: Orthopedics;  Laterality: Left;   UMBILICAL HERNIA REPAIR N/A 03/28/2016   Procedure: LAPAROSCOPIC UMBILICAL HERNIA;  Surgeon: Jimmye Norman, MD;  Location: [facility];  Service: General;  Laterality: N/A;   Family History  Problem Relation Age of Onset   Colon cancer Mother 40       d. 73   Heart disease Father    Dementia Maternal Grandmother    Alzheimer's disease Maternal Grandmother    Dementia Maternal Uncle    Alzheimer's disease Maternal Uncle    Ovarian cancer Cousin  died at 20; pat first cousin   Alzheimer's disease Cousin    Dementia Cousin 48       mat first cousin   Social History   Socioeconomic History   Marital status: Married    Spouse name: Not on file   Number of children: 1   Years of education: 14   Highest education level: Some college, no degree  Occupational History   Occupation: Retired    Comment: Admin  Tobacco Use   Smoking status: Never   Smokeless tobacco: Never  Vaping Use   Vaping status: Never Used  Substance and Sexual Activity   Alcohol use: No   Drug use: No   Sexual activity: Not Currently    Birth control/protection: Surgical  Other Topics Concern   Not on file  Social History Narrative   Right    Two story home   Lives with husband   Social Drivers of Health   Financial Resource Strain: Not on file  Food Insecurity: No Food Insecurity (11/21/2022)   Hunger Vital Sign    Worried About Running  Out of Food in the Last Year: Never true    Ran Out of Food in the Last Year: Never true  Transportation Needs: No Transportation Needs (11/21/2022)   PRAPARE - Administrator, Civil Service (Medical): No    Lack of Transportation (Non-Medical): No  Physical Activity: Not on file  Stress: Not on file  Social Connections: Not on file  Intimate Partner Violence: Not At Risk (11/21/2022)   Humiliation, Afraid, Rape, and Kick questionnaire    Fear of Current or Ex-Partner: No    Emotionally Abused: No    Physically Abused: No    Sexually Abused: No    Review of Systems Constitutional: Patient denies any unintentional weight loss or change in strength lntegumentary: Patient denies any rashes or pruritus Cardiovascular: Patient denies chest pain or syncope Respiratory: Patient denies shortness of breath Gastrointestinal: Patient denies nausea, vomiting, constipation, or diarrhea Musculoskeletal: Patient denies muscle cramps or weakness Neurologic: Patient denies convulsions or seizures Allergic/Immunologic: Patient denies recent allergic reaction(s) Hematologic/Lymphatic: Patient denies bleeding tendencies Endocrine: Patient denies heat/cold intolerance  GU: As per HPI.  OBJECTIVE Vitals:   01/06/23 1247  BP: 132/84  Pulse: 75   There is no height or weight on file to calculate BMI.  Physical Examination Constitutional: No obvious distress; patient is non-toxic appearing  Cardiovascular: No visible lower extremity edema.  Respiratory: The patient does not have audible wheezing/stridor; respirations do not appear labored  Gastrointestinal: Abdomen non-distended Musculoskeletal: Normal ROM of UEs  Skin: No obvious rashes/open sores  Neurologic: CN 2-12 grossly intact Psychiatric: Answered questions appropriately with normal affect  Hematologic/Lymphatic/Immunologic: No obvious bruises or sites of spontaneous bleeding  Urine microscopy (Voided specimen): >30 WBC/hpf,  3-10 RBC/hpf, many bacteria  Urine microscopy (I&O cath'd specimen): >30 WBC/hpf, 3-10 RBC/hpf, many bacteria  PVR: 25 ml  ASSESSMENT Recurrent UTI - Plan: Urinalysis, Routine w reflex microscopic, BLADDER SCAN AMB NON-IMAGING, Urine Culture, Urinalysis, Routine w reflex microscopic, In and Out Cath, Urinalysis, Routine w reflex microscopic, Urine Culture, fosfomycin (MONUROL) 3 g PACK, DISCONTINUED: nitrofurantoin, macrocrystal-monohydrate, (MACROBID) 100 MG capsule  Atrophic vulva - Plan: Urinalysis, Routine w reflex microscopic, BLADDER SCAN AMB NON-IMAGING, Urinalysis, Routine w reflex microscopic, In and Out Cath, Urinalysis, Routine w reflex microscopic, Urine Culture  OAB (overactive bladder) - Plan: Urinalysis, Routine w reflex microscopic, BLADDER SCAN AMB NON-IMAGING, Urinalysis, Routine w reflex microscopic, In and Out  Cath, Urinalysis, Routine w reflex microscopic, Urine Culture  Abnormal urinalysis - Plan: Urine Culture, Urinalysis, Routine w reflex microscopic, In and Out Cath, Urinalysis, Routine w reflex microscopic, Urine Culture, fosfomycin (MONUROL) 3 g PACK, DISCONTINUED: nitrofurantoin, macrocrystal-monohydrate, (MACROBID) 100 MG capsule  UTI symptoms - Plan: Urine Culture, Urinalysis, Routine w reflex microscopic, In and Out Cath, Urinalysis, Routine w reflex microscopic, Urine Culture, fosfomycin (MONUROL) 3 g PACK, DISCONTINUED: nitrofurantoin, macrocrystal-monohydrate, (MACROBID) 100 MG capsule  We reviewed her history and discussed that her UA appeared abnormal at last visit on 12/26/2022 but urine culture was negative, which likely indicates that her urine specimen that day was contaminated. Today's voided UA was abnormal; to obtain a sterile urine specimen for a more accurate evaluation we agreed to proceed with I&O catheter today. The I&O urine specimen was also abnormal. Will send that for urine culture and treat empirically with Fosfomycin while awaiting culture results  and sensitivities (pt is allergic to ceftin / penicillin / sulfa and symptoms occurring despite active use of Macrodantin (Nitrofurantoin) so opted for alternative antibiotic).   We reviewed our previous discussion about vaginal atrophy which can contribute to UTI risk and baseline dysuria in the absence of UTI due to dryness / irritation / loss of tissue integrity in the vagina and urethra.  - The etiology and consequences of urogenital epithelial atrophy was explained to patient.  - The normal bacterial flora that colonizes the perineum may contribute to UTI risk because the thin urethral epithelium allows the bacteria to become adherent and the change in vaginal pH can disrupt the vaginal / urethral microbiome and allow for bacterial overgrowth.  - We discussed that there have been studies that evaluate use of low-dose intravaginal estrogen cream that shows minimal systemic absorption that is negligible after 3 weeks. There have been no studies indicating increased risk of topical vaginal estrogen cream contributing to breast cancer development or recurrence. Printed copy for patient of the 2016 ACOG policy statement "ACOG Supports the Use of Estrogen for Breast Cancer Survivors" for reference.  - She was advised to discuss topical vaginal estrogen cream with her oncologist at her upcoming appointment there.   Will plan for follow up in 4 weeks or sooner if needed. Pt verbalized understanding and agreement. All questions were answered.  PLAN Advised the following: 1. Urine culture 2. Fosfomycin 3 grams x1 dose.  3. Continue Nitrofurantoin 50 mg daily for UTI prophylaxis. 4. Patient to discuss recommendation to start topical vaginal estrogen cream use with oncologist for UTI prevention / vaginal atrophy. 5. Return in about 4 weeks (around 02/03/2023) for UA, PVR, & f/u with Evette Georges NP.  Orders Placed This Encounter  Procedures   Urine Culture   Urine Culture   Urinalysis, Routine w  reflex microscopic   Urinalysis, Routine w reflex microscopic   Urinalysis, Routine w reflex microscopic   In and Out Cath   BLADDER SCAN AMB NON-IMAGING   It has been explained that the patient is to follow regularly with their PCP in addition to all other providers involved in their care and to follow instructions provided by these respective offices. Patient advised to contact urology clinic if any urologic-pertaining questions, concerns, new symptoms or problems arise in the interim period.  Patient Instructions  Recommendations regarding UTI prevention / management:  Options when UTI symptoms occur: 1. Call [facility] [place] to request urgent / same-day visit (phone # [phone]).  2. Call your Primary Care Provider (PCP) office to request urgent /  same-day visit. Be sure to request for urine culture to be ordered and have results faxed to Urology (fax # [phone]).  3. Go to urgent care. Be sure to request for urine culture to be ordered and have results faxed to Urology (fax # [phone]).   For bladder pain/ burning with urination: - Can take OTC Pyridium (phenazopyridine; commonly known under the "AZO" brand) for a few days as needed. Limit use to no more than 3 days consecutively due to risk for methemoglobinemia, liver function issues, and bone health damage with long term use of Pyridium.  Routine use for UTI prevention: - Low dose antibiotic daily for UTI prophylaxis. - Topical vaginal estrogen for vaginal atrophy. - Adequate daily fluid intake to flush out the urinary tract. - Go to the bathroom to urinate every 4-6 hours while awake to minimize urinary stasis / bacterial overgrowth in the bladder. - Proanthocyanidin (PAC) supplement 36 mg daily; must be soluble (insoluble form of PAC will be ineffective). Recommended brand: Ellura. This is an over-the-counter supplement (often must be found/ purchased online) supplement derived from cranberries with  concentrated active component: Proanthocyanidin (PAC) 36 mg daily. Decreases bacterial adherence to bladder lining.  - D-mannose powder (2 grams daily). This is an over-the-counter supplement which decreases bacterial adherence to bladder lining (it is a sugar that inhibits bacterial adherence to urothelial cells by binding to the pili of enteric bacteria). Take as per manufacturer recommendation. Can be used as an alternative or in addition to the concentrated cranberry supplement.  - Vitamin C supplement to acidify urine to minimize bacterial growth.  - Probiotic to maintain healthy vaginal microbiome to suppress bacteria at urethral opening. Brand recommendations: Darrold Junker (includes probiotic & D-mannose ), Feminine Balance (highest concentration of lactobacillus) or Hyperbiotic Pro 15.  Note for patients with diabetes:  - Be aware that D-mannose contains sugar.  Note for patients with interstitial cystitis (IC):  - Patients with IC should typically avoid cranberry/ PAC supplements and Vitamin C supplements due to their acidity, which may exacerbate IC-related bladder pain. - Symptoms of true bacterial UTI can overlap / mimic symptoms of an IC flare up. Antibiotic use is NOT indicated for IC flare ups. Urine culture needed prior to antibiotic treatment for IC patients. The goal is to minimize your risk for developing antibiotic-resistant bacteria.    Vulvovaginal atrophy I Genitourinary syndrome of menopause (GSM):  What it is: Changes in the vaginal environment (including the vulva and urethra) including: Thinning of the epithelium (skin/ mucosa surface) Can contribute to urinary urgency and frequency Can contribute to dryness, itching, irritation of the vulvar and vaginal tissue Can contribute to pain with intercourse Can contribute to physical changes of the labia, vulva, and vagina such as: Narrowing of the vaginal opening Decreased vaginal length Loss of labial architecture Labial  adhesions Pale color of vulvovaginal tissue  Loss of pubic hair Allows bacteria to become adherent  Results in increased risk for urinary tract infection (UTI) due to bacterial overgrowth and migration up the urethra into the bladder Change in vaginal pH (acid/ base balance) Allows for alteration / disruption of the normal bacterial flora / microbiome Results in increased risk for urinary tract infection (UTI) due to bacterial overgrowth  Treatment options: Over-the-counter lubricants (see list below). Prescription vaginal estrogen replacement. Options: Topical vaginal estrogen cream Estrace, Premarin, or compounded estradiol cream/ gel We advise: Discard plastic applicator as that tends to use more medication than you need, which is not harmful but wastes / uses  up the medication. Also the plastic applicator may cause discomfort. Insert blueberry size amount of medication via the tip of your finger inside vagina nightly for 1 week then 2-3 times per week (long term). Estring vaginal ring Exchanged every 3 months (either at home or in office by provider) Vagifem vaginal tablet Inserted nightly for 2 weeks then twice a week (long term) lntrarosa vaginal suppository Vaginal DHEA: converts to estrogen in vaginal tissue without systemic effect Inserted nightly (long term) Vaginal laser therapy (Mona Lisa touch) Performed in 3 treatments each 6 weeks apart (available in our [place] office). Can feel like a sunburn for 3-4 days after each treatment until new skin heals in. Usually not covered by insurance. Estimated cost is $1500 for all 3 sessions.  FYI regarding prescription vaginal estrogen treatment options: All topical vaginal estrogen replacement options are equivalent in terms of efficacy. Topical vaginal estrogen replacement will take about 3 months to be effective. OK to have sex with any of the topical vaginal estrogen replacement options. Topical vaginal estrogen replacement  may sting/burn initially due to severe dryness, which will improve with ongoing treatment. There have been studies that evaluate use of low-dose intravaginal estrogen that show minimal systemic absorption which is negligible after 3 weeks. There have been no studies indicating increased risk of contributing to cancer development or recurrence.  Topical vaginal estrogen cream safe to use with breast cancer history [email or web address]  Topical vaginal estrogen cream safe to use with blood clot history [email or web address]   Lubricants and Moisturizers for Treating Genitourinary Syndrome of Menopause and Vulvovaginal Atrophy Treatment Comments I Available Products   Lubricants   Water-based Ingredients: Deionized water, glycerin, propylene glycol; latex safe; rare irritation; dry out with extended sexual activity Astroglide, Good Clean Love, K-Y Jelly, Natural, Organic, Pink, Sliquid, Sylk, Yes    Oil Based Ingredients: avocado, olive, peanut, corn; latex safe; can be used with silicone products; staining; safe (unless peanut allergy); non-irritating Coconut oil, vegetable oil, vitamin E oil  Silicone-Based Ingredients: Silicone polymers; staining; typically nonirritating, long lasting; waterproof; should not be used with silicone dilators, sexual toys, or gynecologic products Astroglide X, Oceanus Ultra Pure, Pink Silicone, Pjur Eros, Replens Silky Smooth, Silicone Premium JO, SKYN, Uberlube, [employer] Based Minimize harm to sperm motility; designed Astroglide TTC, Conceive Plus, Pre for couples trying to conceive Seed, Yes Baby  Fertility Friendly Minimize harm to sperm motility; designed Astroglide, TTC, Conceive Plus, Pre for couples  trying to conceive Seed, Yes Baby  Vaginal Moisturizers   Vaginal Moisturizers For maintenance use 1 to 3 times weekly; can benefit women with dryness, chafing with AOL, and recurrent vaginal infections irrespective of sexual activity timing Balance Active Menopause Vaginal Moisturizing Lubricant, Canesintima Intimate Moisturizer, Replens, Rephresh, Sylk Natural Intimate Moisturizer, Yes Vaginal Moisturizer  Hybrids Properties of both water and silicone-based products (combination of a vaginal lubricant and moisturizer); Non-irritating; good option for women with allergies and sensitivities Lubrigyn, Luvena  Suppositories Hyaluronic acid to retain moisture Revaree  Vulvar Soothing Creams/Oils    Medicated CreamsP ain and burn relief; Ingredients: 4% Lidocaine, Aloe Vera gel Releveum ([place])  Non-Medicated Creams For anti-itch and moisture/maintenance; Ingredients: Coconut oil, Avocado oil, Shea Butter, Olive oil, Vitamin E Vajuvenate, Vmagic  Oils !For moisture/maintenance !Coconut oil, Vitamin E oil, Emu oil     Electronically signed by:  Donnita Falls, FNP   01/06/23    2:54 PM

## 2023-01-06 NOTE — Telephone Encounter (Signed)
Pharmacist is made aware and voiced understanding.

## 2023-01-06 NOTE — Patient Instructions (Signed)
 Recommendations regarding UTI prevention / management:  Options when UTI symptoms occur: 1. Call [facility] to request urgent / same-day visit (phone # [phone]).  2. Call your Primary Care Provider (PCP) office to request urgent / same-day visit. Be sure to request for urine culture to be ordered and have results faxed to Urology (fax # [phone]).  3. Go to urgent care. Be sure to request for urine culture to be ordered and have results faxed to Urology (fax # [phone]).   For bladder pain/ burning with urination: - Can take OTC Pyridium (phenazopyridine; commonly known under the "AZO" brand) for a few days as needed. Limit use to no more than 3 days consecutively due to risk for methemoglobinemia, liver function issues, and bone health damage with long term use of Pyridium.  Routine use for UTI prevention: - Low dose antibiotic daily for UTI prophylaxis. - Topical vaginal estrogen for vaginal atrophy. - Adequate daily fluid intake to flush out the urinary tract. - Go to the bathroom to urinate every 4-6 hours while awake to minimize urinary stasis / bacterial overgrowth in the bladder. - Proanthocyanidin (PAC) supplement 36 mg daily; must be soluble (insoluble form of PAC will be ineffective). Recommended brand: Ellura. This is an over-the-counter supplement (often must be found/ purchased online) supplement derived from cranberries with concentrated active component: Proanthocyanidin (PAC) 36 mg daily. Decreases bacterial adherence to bladder lining.  - D-mannose powder (2 grams daily). This is an over-the-counter supplement which decreases bacterial adherence to bladder lining (it is a sugar that inhibits bacterial adherence to urothelial cells by binding to the pili of enteric bacteria). Take as per manufacturer recommendation. Can be used as an alternative or in addition to the concentrated cranberry supplement.  - Vitamin C supplement to acidify urine to  minimize bacterial growth.  - Probiotic to maintain healthy vaginal microbiome to suppress bacteria at urethral opening. Brand recommendations: Darrold Junker (includes probiotic & D-mannose ), Feminine Balance (highest concentration of lactobacillus) or Hyperbiotic Pro 15.  Note for patients with diabetes:  - Be aware that D-mannose contains sugar.  Note for patients with interstitial cystitis (IC):  - Patients with IC should typically avoid cranberry/ PAC supplements and Vitamin C supplements due to their acidity, which may exacerbate IC-related bladder pain. - Symptoms of true bacterial UTI can overlap / mimic symptoms of an IC flare up. Antibiotic use is NOT indicated for IC flare ups. Urine culture needed prior to antibiotic treatment for IC patients. The goal is to minimize your risk for developing antibiotic-resistant bacteria.    Vulvovaginal atrophy I Genitourinary syndrome of menopause (GSM):  What it is: Changes in the vaginal environment (including the vulva and urethra) including: Thinning of the epithelium (skin/ mucosa surface) Can contribute to urinary urgency and frequency Can contribute to dryness, itching, irritation of the vulvar and vaginal tissue Can contribute to pain with intercourse Can contribute to physical changes of the labia, vulva, and vagina such as: Narrowing of the vaginal opening Decreased vaginal length Loss of labial architecture Labial adhesions Pale color of vulvovaginal tissue Loss of pubic hair Allows bacteria to become adherent  Results in increased risk for urinary tract infection (UTI) due to bacterial overgrowth and migration up the urethra into the bladder Change in vaginal pH (acid/ base balance) Allows for alteration / disruption of the normal bacterial flora / microbiome Results in increased risk for urinary tract infection (UTI) due to bacterial overgrowth  Treatment options: Over-the-counter lubricants (see list below). Prescription  vaginal  estrogen replacement. Options: Topical vaginal estrogen cream Estrace, Premarin, or compounded estradiol cream/ gel We advise: Discard plastic applicator as that tends to use more medication than you need, which is not harmful but wastes / uses up the medication. Also the plastic applicator may cause discomfort. Insert blueberry size amount of medication via the tip of your finger inside vagina nightly for 1 week then 2-3 times per week (long term). Estring vaginal ring Exchanged every 3 months (either at home or in office by provider) Vagifem vaginal tablet Inserted nightly for 2 weeks then twice a week (long term) lntrarosa vaginal suppository Vaginal DHEA: converts to estrogen in vaginal tissue without systemic effect Inserted nightly (long term) Vaginal laser therapy (Mona Lisa touch) Performed in 3 treatments each 6 weeks apart (available in our [place] office). Can feel like a sunburn for 3-4 days after each treatment until new skin heals in. Usually not covered by insurance. Estimated cost is $1500 for all 3 sessions.  FYI regarding prescription vaginal estrogen treatment options: All topical vaginal estrogen replacement options are equivalent in terms of efficacy. Topical vaginal estrogen replacement will take about 3 months to be effective. OK to have sex with any of the topical vaginal estrogen replacement options. Topical vaginal estrogen replacement may sting/burn initially due to severe dryness, which will improve with ongoing treatment. There have been studies that evaluate use of low-dose intravaginal estrogen that show minimal systemic absorption which is negligible after 3 weeks. There have been no studies indicating increased risk of contributing to cancer development or recurrence.  Topical vaginal estrogen cream safe to use with breast cancer history [email or web address]  Topical  vaginal estrogen cream safe to use with blood clot history [email or web address]   Lubricants and Moisturizers for Treating Genitourinary Syndrome of Menopause and Vulvovaginal Atrophy Treatment Comments I Available Products   Lubricants   Water-based Ingredients: Deionized water, glycerin, propylene glycol; latex safe; rare irritation; dry out with extended sexual activity Astroglide, Good Clean Love, K-Y Jelly, Natural, Organic, Pink, Sliquid, Sylk, Yes    Oil Based Ingredients: avocado, olive, peanut, corn; latex safe; can be used with silicone products; staining; safe (unless peanut allergy); non-irritating Coconut oil, vegetable oil, vitamin E oil  Silicone-Based Ingredients: Silicone polymers; staining; typically nonirritating, long lasting; waterproof; should not be used with silicone dilators, sexual toys, or gynecologic products Astroglide X, Oceanus Ultra Pure, Pink Silicone, Pjur Eros, Replens Silky Smooth, Silicone Premium JO, SKYN, Uberlube, [employer] Based Minimize harm to sperm motility; designed Astroglide TTC, Conceive Plus, Pre for couples trying to conceive Seed, Yes Baby  Fertility Friendly Minimize harm to sperm motility; designed Astroglide, TTC, Conceive Plus, Pre for couples trying to conceive Seed, Yes Baby  Vaginal Moisturizers   Vaginal Moisturizers For maintenance use 1 to 3 times weekly; can benefit women with dryness, chafing with AOL, and recurrent vaginal infections irrespective of sexual activity timing Balance Active Menopause Vaginal Moisturizing Lubricant, Canesintima Intimate Moisturizer, Replens, Rephresh, Sylk Natural Intimate Moisturizer, Yes Vaginal Moisturizer  Hybrids Properties of both water and silicone-based products (combination of a vaginal lubricant and moisturizer);  Non-irritating; good option for women with allergies and sensitivities Lubrigyn, Luvena  Suppositories Hyaluronic acid to retain moisture Revaree  Vulvar Soothing Creams/Oils    Medicated CreamsP ain and burn relief; Ingredients: 4% Lidocaine, Aloe Vera gel Releveum ([place])  Non-Medicated Creams For anti-itch and moisture/maintenance; Ingredients: Coconut oil, Avocado oil, Shea Butter, Olive oil, Vitamin E Vajuvenate, Vmagic  Oils !For moisture/maintenance !Coconut oil, Vitamin  E oil, Emu oil

## 2023-01-06 NOTE — Telephone Encounter (Signed)
-----   Message from Donnita Falls sent at 01/06/2023  2:50 PM EST ----- Please call pt's pharmacy and tell them to disregard the Macrobid rx (erroneous) - the intended antibiotic for her to take today is Fosfomycin. Thanks!

## 2023-01-13 LAB — URINE CULTURE

## 2023-01-16 ENCOUNTER — Telehealth: Payer: Self-pay

## 2023-01-16 NOTE — Telephone Encounter (Signed)
Patient is made aware. Patient states she is still burning after completing Fosfomycin. Patient states her nightly abt was increased to 100 mg. Patient states she is unsure if she should continued nightly abt being that she is still burning during voiding. Patient is aware a message will be sent to the NP and someone will reach back out with Sarah's recommendation. Patient voiced understanding

## 2023-01-16 NOTE — Telephone Encounter (Signed)
-----   Message from Kim Krueger sent at 01/14/2023  6:30 PM EST ----- Please let pt know urine culture result. The Fosfomycin which was prescribed should cover this pathogen. Thanks.

## 2023-01-16 NOTE — Telephone Encounter (Signed)
Patient is made aware "Her persistent symptoms are likely due to post-infectious inflammation and even moreso her previously untreated vaginal atrophy. As discussed at LOV she is strongly advised to consider starting topical vaginal estrogen cream use (she was planning to discuss that with her oncologist first). Can resume use of Nitrofurantoin 50 mg daily for UTI prophylaxis (was not increased to 100 mg nightly). If desired, can have nurse visit or lab visit for repeat urine culture to recheck." Patient voiced understanding

## 2023-01-24 NOTE — Progress Notes (Signed)
 Assessment/Plan:   Mild cognitive impairment   Kim Krueger is a very pleasant 83 y.o. 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 Patient is on memantine  5 mg twice daily, tolerating well. MMSE is 30/30. Memory is stable. Patient is able to participate on his ADLs and to drive without difficulties. Mood is good.            Recommendations:   Follow up in 6  months. Continue memantine  5 mg twice daily, side effects discussed Patient is scheduled for repeat neuropsych evaluation on May 2025 for diagnostic clarity and disease trajectory Recommend good control of cardiovascular risk factors Continue to control mood as per PCP    Subjective:   This patient is accompanied in the office by her husband  who supplements the history. Previous records as well as any outside records available were reviewed prior to todays visit.   Patient was last seen on 07/2022 with MMSE 28/30      Any changes in memory since last visit? About the same, unless I get really tired.  She has some difficulty remembering recent conversations and names of people, but not worse than prior. She tries to read extensively, watching the news on her phone. Likes crossword puzzles  repeats oneself?  Endorsed by her husband Disoriented when walking into a room?  Patient denies    Misplacing objects?  Patient denies.   Wandering behavior?   Denies.   Any personality changes since last visit? denies .  Any worsening depression?: denies.   Hallucinations or paranoia?  Denies.   Seizures?   Denies.    Any sleep changes? Sleeps well.  Denies vivid dreams, REM behavior or sleepwalking   Sleep apnea?   Denies.   Any hygiene concerns?   denies    Independent of bathing and dressing?  Endorsed  Does the patient needs help with medications? Patient is in charge  Who is in charge of the finances?  Husband is in charge .   Any changes in appetite?  Denies.    Patient have trouble swallowing?  Denies.   Does the patient cook?  No    Any headaches?    Denies.   Vision changes? Has cataracts, for B surgery in  June 2025  Chronic pain?  Denies. Ambulates with difficulty?  She has knee osteoarthritis, which limits her mobility. Uses a cane for stability. She had L knee  surgery last Nov 2024 .  Recent falls or head injuries? denies      Unilateral weakness, numbness or tingling? Denies.  Any tremors?  Denies.   Any anosmia?    denies   Any incontinence of urine?  Endorsed, she has a history of frequent UTIs as before, follows with urology. Last during surgery 11/2022 Any bowel dysfunction?  denies      Patient lives with her husband  Does the patient drive?  Endorsed, short and long distance distances   Neuropsych evaluation March 2024 Briefly, results suggested prominent impairment surrounding delayed retrieval and recognition/consolidation aspects of learning and memory. Isolated deficits were also noted across response inhibition and a line orientation task. However, all other performances assessing executive functioning and visuospatial abilities respectively were appropriate. The etiology for ongoing memory impairment is unclear. Across memory testing, Kim Krueger  was able to learn novel information reasonably well. However, after a brief delay, she was fully amnestic (i.e., 0% retention) across both verbal memory tasks and her retention was only 12% across a single visual memory task. While she did benefit from cueing across story content, performance across recognition trials for list and figure memory tasks were quite poor. Taken together, this would suggest rapid forgetting, as well as concerns for an evolving and already fairly  significant storage impairment. This pattern of memory loss can be characteristic of Alzheimer's disease and while I am unable to rule in this condition presently, it remains plausible and continued medical monitoring over time will be important.        Initial visit 07/22/21  How long did patient have memory difficulties?  She reports that her memory worsened  over the last  4-6  months. Took a medicine when I was in the hospital  that may have had something to do with it Patient lives with: Spouse who reports that her memory is worse over the last 6 months  couldn't remember the date and time . Daughter reports that her memory deficits arelonger than that,maybe 9 mo to  year,  writing things all the time such as conversations and appointments because she otherwise could not remember  repeats oneself? Endorsed Disoriented when walking into a room?  Patient denies   Leaving objects in unusual places?  Patient denies  but  husband  reports that she places her purse anywhere.  The other day she cod not remember where she left her phone we moved the house upside down and then she realized the phone was in her pocket   Ambulates  with difficulty?   Patient denies   Recent falls?  Endorsed  I have some knee issues so  I use a walker when needed  Any head injuries?  Patient denies   History of seizures?   Patient denies   Wandering behavior?  Patient denies   Patient drives?   Patient drives very short distances  to the bank, beauty salon, grocery and drugstore Any mood changes such irritability agitation? She is a lot nicer than she was- daughter says  Any history of depression?:  Patient denies   Hallucinations?  Patient denies at this moment, only when I was in the hospital   Paranoia?  Patient denies   Patient reports that he sleeps well without vivid dreams, REM behavior or sleepwalking   History of sleep apnea?  Patient denies   Any hygiene concerns?  Patient denies   Independent  of bathing and dressing?  Endorsed  Does the patient needs help with medications?  Husband in charge Who is in charge of the finances? Husband is in charge    Any changes in appetite?  Patient denies, but  she doesn't drink enough water   Patient have trouble swallowing? Patient denies   Does the patient cook?  Patient denies   Any kitchen accidents such as leaving the stove on? Husband does the cooking  Any headaches?  Patient denies  TA  Double vision? Patient denies   Any focal numbness or tingling?  Patient denies   Chronic back pain Patient denies   Unilateral weakness?  Patient denies   Any tremors?  Patient denies   Any history of anosmia?  Patient denies   Any incontinence of urine?  Incontinence wears pads. Frequent UTIs Any bowel dysfunction?   Patient denies    History of heavy alcohol intake?  Patient denies  History of heavy tobacco use?  Patient denies   Family history of dementia?   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  The Hereditary Gene Panel offered by [facility] includes sequencing and/or deletion duplication testing of the following 47 genes: APC, ATM, AXIN2, BARD1, BMPR1A, BRCA1, BRCA2, BRIP1, CDH1, CDK4, CDKN2A (p14ARF), CDKN2A (p16INK4a), CHEK2, CTNNA1, DICER1, EPCAM (Deletion/duplication testing only), GREM1 (promoter region deletion/duplication   History of uterine cancer 03/14/2018   s/p hysterectomy   Hypokalemia 07/14/2021   Hypothyroidism    Impingement syndrome of right shoulder region 03/13/2021   Malignant neoplasm of upper-inner  quadrant of left breast in female, estrogen receptor positive 02/12/2018   Metatarsalgia of right foot 03/13/2021   OAB (overactive bladder) 12/13/2021   Osteoarthritis of left knee 03/16/2018   Osteoporosis 12/28/2021   Pain in joint of right shoulder 03/13/2021   Pain in left knee 12/13/2017   Prolonged QT interval 07/14/2021   Sepsis due to undetermined organism 07/14/2021   Temporal arteritis    Urge incontinence    UTI (urinary tract infection) 07/14/2021     Past Surgical History:  Procedure Laterality Date   ABDOMINAL HYSTERECTOMY     ovaries removed as well   APPENDECTOMY     appendix ruptured as child, still have part of appendix   ARTERY BIOPSY Right 10/19/2015   Procedure: RIGHT TEMPORAL ARTERY BIOPSY;  Surgeon: Lynwood Pina, MD;  Location: [facility];  Service: General;  Laterality: Right;   BREAST LUMPECTOMY WITH RADIOACTIVE SEED LOCALIZATION Left 02/28/2018   Procedure: LEFT BREAST LUMPECTOMY WITH RADIOACTIVE SEED LOCALIZATION;  Surgeon: Mikell Katz, MD;  Location: [facility];  Service: General;  Laterality: Left;   HERNIA REPAIR  03/2016   umbilical   INSERTION OF MESH N/A 03/28/2016   Procedure: INSERTION OF MESH;  Surgeon: Lynwood Pina, MD;  Location: [facility];  Service: General;  Laterality: N/A;   TOTAL KNEE ARTHROPLASTY Left 11/21/2022   Procedure: TOTAL KNEE ARTHROPLASTY;  Surgeon: Melodi Lerner, MD;  Location: [facility];  Service: Orthopedics;  Laterality: Left;   UMBILICAL HERNIA REPAIR N/A 03/28/2016   Procedure: LAPAROSCOPIC UMBILICAL HERNIA;  Surgeon: Lynwood Pina, MD;  Location: [facility];  Service: General;  Laterality: N/A;     PREVIOUS MEDICATIONS:   CURRENT MEDICATIONS:  Outpatient Encounter Medications as of 01/25/2023  Medication Sig   acetaminophen  (TYLENOL ) 500 MG tablet Take 1,000 mg by mouth 2 (two) times daily.   anastrozole  (ARIMIDEX ) 1 MG tablet TAKE 1 TABLET(1 MG) BY MOUTH DAILY   chlorhexidine  (HIBICLENS ) 4 %  external liquid Apply 15 mLs (1 Application total) topically as directed for 30 doses. Use as directed daily for 5 days every other week for 6 weeks.   gabapentin  (NEURONTIN ) 300 MG capsule Take a 300 mg capsule three times a day for two weeks following surgery.Then take a 300 mg capsule two times a day for two weeks. Then take a 300 mg capsule once a day for two weeks. Then discontinue.   levothyroxine  (SYNTHROID ) 88 MCG tablet Take 1 tablet by mouth daily.   memantine  (  NAMENDA ) 5 MG tablet Take 1 tablet twice a day   methocarbamol  (ROBAXIN ) 500 MG tablet Take 1 tablet (500 mg total) by mouth every 6 (six) hours as needed for muscle spasms.   nitrofurantoin  (MACRODANTIN ) 50 MG capsule Take 1 capsule (50 mg total) by mouth at bedtime.   oxyCODONE  (OXY IR/ROXICODONE ) 5 MG immediate release tablet Take 1-2 tablets (5-10 mg total) by mouth every 6 (six) hours as needed for severe pain (pain score 7-10).   No facility-administered encounter medications on file as of 01/25/2023.     Objective:     PHYSICAL EXAMINATION:    VITALS:   Vitals:   01/25/23 1033  BP: (!) 186/87  Pulse: 72  Resp: 20  SpO2: 95%  Weight: 155 lb (70.3 kg)  Height: 5' 3.5 (1.613 m)    GEN:  The patient appears stated age and is in NAD. HEENT:  Normocephalic, atraumatic.   Neurological examination:  General: NAD, well-groomed, appears stated age. Orientation: The patient is alert. Oriented to person, place and date Cranial nerves: There is good facial symmetry.The speech is fluent and clear. No aphasia or dysarthria. Fund of knowledge is appropriate. Recent memory impaired and remote memory is normal.  Attention and concentration are normal.  Able to name objects and repeat phrases.  Hearing is intact to conversational tone.   Delayed recall 3/3 Sensation: Sensation is intact to light touch throughout Motor: Strength is at least antigravity x4. DTR's 2/4 in UE/LE      07/24/2021    3:00 PM  Montreal Cognitive  Assessment   Visuospatial/ Executive (0/5) 0  Naming (0/3) 3  Attention: Read list of digits (0/2) 2  Attention: Read list of letters (0/1) 1  Attention: Serial 7 subtraction starting at 100 (0/3) 2  Language: Repeat phrase (0/2) 2  Language : Fluency (0/1) 0  Abstraction (0/2) 2  Delayed Recall (0/5) 0  Orientation (0/6) 5  Total 17  Adjusted Score (based on education) 17       01/25/2023   11:00 AM 07/25/2022   12:00 PM 01/24/2022   10:00 AM  MMSE - Mini Mental State Exam  Orientation to time 5 4 5   Orientation to Place 5 5 5   Registration 3 3 3   Attention/ Calculation 5 5 5   Recall 3 2 2   Language- name 2 objects 2 2 2   Language- repeat 1 1 1   Language- follow 3 step command 3 3 3   Language- read & follow direction 1 1 1   Write a sentence 1 1 1   Copy design 1 1 1   Total score 30 28 29        Movement examination: Tone: There is normal tone in the UE/LE Abnormal movements:  no tremor.  No myoclonus.  No asterixis.   Coordination:  There is no decremation with RAM's. Normal finger to nose  Gait and Station: The patient has no difficulty arising out of a deep-seated chair without the use of the hands. The patient's stride length is good.  Gait is cautious and narrow.   Thank you for allowing us  the opportunity to participate in the care of this nice patient. Please do not hesitate to contact us  for any questions or concerns.   Total time spent on today's visit was 36 minutes dedicated to this patient today, preparing to see patient, examining the patient, ordering tests and/or medications and counseling the patient, documenting clinical information in the EHR or other health record, independently interpreting results and communicating results  to the patient/family, discussing treatment and goals, answering patient's questions and coordinating care.  Cc:  Tisovec, Charlie ORN, MD  Camie Sevin 01/25/2023 11:06 AM

## 2023-01-25 ENCOUNTER — Encounter: Payer: Self-pay | Admitting: Physician Assistant

## 2023-01-25 ENCOUNTER — Ambulatory Visit: Payer: Medicare Other | Admitting: Physician Assistant

## 2023-01-25 VITALS — BP 140/70 | HR 72 | Resp 20 | Ht 63.5 in | Wt 155.0 lb

## 2023-01-25 DIAGNOSIS — G3184 Mild cognitive impairment, so stated: Secondary | ICD-10-CM

## 2023-01-25 DIAGNOSIS — R413 Other amnesia: Secondary | ICD-10-CM

## 2023-01-25 MED ORDER — MEMANTINE HCL 5 MG PO TABS: ORAL_TABLET | ORAL | 3 refills | Status: DC

## 2023-01-25 NOTE — Patient Instructions (Signed)
 .It was a pleasure to see you today at our office.   Recommendations:   Follow up in 6  months. Repeat neurocognitive testing for clarity of the diagnosis   Continue memantine  5  mg twice daily, side effects discussed Recommend good control of cardiovascular risk factors  Whom to call:  Memory  decline, memory medications: Call our office [phone]   For psychiatric meds, mood meds: Please have your primary care physician manage these medications.   For assessment of decision of mental capacity and competency:  Call Dr. Rosaline Nine, geriatric psychiatrist at [phone]  For guidance in geriatric dementia issues please call [facility] [phone]  For guidance regarding [facility] and if placement were needed at the facility, contact Nat Hock, Social Worker tel: [phone]  If you have any severe symptoms of a stroke, or other severe issues such as confusion,severe chills or fever, etc call 911 or go to the ER as you may need to be evaluated further   Feel free to visit Facebook page  [employer] for tips of how to care for people with memory problems.      RECOMMENDATIONS FOR ALL PATIENTS WITH MEMORY PROBLEMS: 1. Continue to exercise (Recommend 30 minutes of walking everyday, or 3 hours every week) 2. Increase social interactions - continue going to [place] and enjoy social gatherings with friends and family 3. Eat healthy, avoid fried foods and eat more fruits and vegetables 4. Maintain adequate blood pressure, blood sugar, and blood cholesterol level. Reducing the risk of stroke and cardiovascular disease also helps promoting better memory. 5. Avoid stressful situations. Live a simple life and avoid aggravations. Organize your time and prepare for the next day in anticipation. 6. Sleep well, avoid any interruptions of sleep and avoid any distractions in the bedroom that may interfere with adequate sleep quality 7. Avoid sugar, avoid  sweets as there is a strong link between excessive sugar intake, diabetes, and cognitive impairment We discussed the Mediterranean diet, which has been shown to help patients reduce the risk of progressive memory disorders and reduces cardiovascular risk. This includes eating fish, eat fruits and green leafy vegetables, nuts like almonds and hazelnuts, walnuts, and also use olive oil. Avoid fast foods and fried foods as much as possible. Avoid sweets and sugar as sugar use has been linked to worsening of memory function.  There is always a concern of gradual progression of memory problems. If this is the case, then we may need to adjust level of care according to patient needs. Support, both to the patient and caregiver, should then be put into place.    FALL PRECAUTIONS: Be cautious when walking. Scan the area for obstacles that may increase the risk of trips and falls. When getting up in the mornings, sit up at the edge of the bed for a few minutes before getting out of bed. Consider elevating the bed at the head end to avoid drop of blood pressure when getting up. Walk always in a well-lit room (use night lights in the walls). Avoid area rugs or power cords from appliances in the middle of the walkways. Use a walker or a cane if necessary and consider physical therapy for balance exercise. Get your eyesight checked regularly.  FINANCIAL OVERSIGHT: Supervision, especially oversight when making financial decisions or transactions is also recommended.  HOME SAFETY: Consider the safety of the kitchen when operating appliances like stoves, microwave oven, and blender. Consider having supervision and share cooking responsibilities until no  longer able to participate in those. Accidents with firearms and other hazards in the house should be identified and addressed as well.   ABILITY TO BE LEFT ALONE: If patient is unable to contact 911 operator, consider using [employer], or when the need is there, arrange  for someone to stay with patients. Smoking is a fire hazard, consider supervision or cessation. Risk of wandering should be assessed by caregiver and if detected at any point, supervision and safe proof recommendations should be instituted.  MEDICATION SUPERVISION: Inability to self-administer medication needs to be constantly addressed. Implement a mechanism to ensure safe administration of the medications.   DRIVING: Regarding driving, in patients with progressive memory problems, driving will be impaired. We advise to have someone else do the driving if trouble finding directions or if minor accidents are reported. Independent driving assessment is available to determine safety of driving.   If you are interested in the driving assessment, you can contact the following:  [employer] in [place] [phone]  [facility] [phone]  [facility] [phone]  [facility] [phone] or [phone]

## 2023-02-28 ENCOUNTER — Encounter (HOSPITAL_COMMUNITY): Payer: Medicare Other

## 2023-02-28 ENCOUNTER — Ambulatory Visit (HOSPITAL_COMMUNITY): Admission: RE | Admit: 2023-02-28 | Payer: Medicare Other | Source: Ambulatory Visit

## 2023-03-07 ENCOUNTER — Ambulatory Visit (HOSPITAL_COMMUNITY)
Admission: RE | Admit: 2023-03-07 | Discharge: 2023-03-07 | Disposition: A | Discharge status: Home / Self Care | Payer: Medicare Other | Source: Ambulatory Visit | Attending: Hematology and Oncology

## 2023-03-07 ENCOUNTER — Ambulatory Visit (HOSPITAL_COMMUNITY)
Admission: RE | Admit: 2023-03-07 | Discharge: 2023-03-07 | Disposition: A | Discharge status: Home / Self Care | Payer: Medicare Other | Source: Ambulatory Visit | Attending: Hematology and Oncology | Admitting: Hematology and Oncology

## 2023-03-07 ENCOUNTER — Encounter (HOSPITAL_COMMUNITY): Payer: Self-pay

## 2023-03-07 DIAGNOSIS — R928 Other abnormal and inconclusive findings on diagnostic imaging of breast: Secondary | ICD-10-CM | POA: Diagnosis present

## 2023-03-08 ENCOUNTER — Other Ambulatory Visit (HOSPITAL_COMMUNITY): Payer: Self-pay | Admitting: Hematology and Oncology

## 2023-03-08 DIAGNOSIS — R921 Mammographic calcification found on diagnostic imaging of breast: Secondary | ICD-10-CM

## 2023-03-13 ENCOUNTER — Ambulatory Visit
Admission: RE | Admit: 2023-03-13 | Discharge: 2023-03-13 | Disposition: A | Discharge status: Home / Self Care | Payer: Medicare Other | Source: Ambulatory Visit | Attending: Hematology and Oncology | Admitting: Hematology and Oncology

## 2023-03-13 ENCOUNTER — Ambulatory Visit
Admission: RE | Admit: 2023-03-13 | Discharge: 2023-03-13 | Disposition: A | Discharge status: Home / Self Care | Payer: Medicare Other | Source: Ambulatory Visit | Attending: Hematology and Oncology

## 2023-03-13 DIAGNOSIS — R921 Mammographic calcification found on diagnostic imaging of breast: Secondary | ICD-10-CM

## 2023-03-13 HISTORY — PX: BREAST BIOPSY: SHX20

## 2023-03-14 LAB — SURGICAL PATHOLOGY

## 2023-03-20 ENCOUNTER — Telehealth: Payer: Self-pay

## 2023-03-20 ENCOUNTER — Other Ambulatory Visit: Payer: Self-pay | Admitting: Urology

## 2023-03-20 ENCOUNTER — Other Ambulatory Visit

## 2023-03-20 ENCOUNTER — Telehealth: Payer: Self-pay | Admitting: Urology

## 2023-03-20 DIAGNOSIS — Z87440 Personal history of urinary (tract) infections: Secondary | ICD-10-CM

## 2023-03-20 DIAGNOSIS — N390 Urinary tract infection, site not specified: Secondary | ICD-10-CM

## 2023-03-20 MED ORDER — NITROFURANTOIN MONOHYD MACRO 100 MG PO CAPS: 100.0000 mg | ORAL_CAPSULE | Freq: Two times a day (BID) | ORAL | 0 refills | Status: AC

## 2023-03-20 NOTE — Telephone Encounter (Signed)
 Has uti and is dropping off specimen today. She would like Maralyn Sago to contact Dr Pamelia Hoit about the creams she talked with her about. She has a history of breast and uterine cancer and wants to make sure they are safe to use.

## 2023-03-20 NOTE — Telephone Encounter (Signed)
-----   Message from Donnita Falls sent at 03/20/2023  4:12 PM EST ----- Please let pt know I have sent prescription for Macrobid to treat empirically for suspected UTI based on her reported symptoms and abnormal UA today while awaiting urine culture result. Instruct patient to hold daily low dose antibiotic while taking this acute course of antibiotics. Thanks.

## 2023-03-20 NOTE — Telephone Encounter (Signed)
Patient is made aware and verbalized understanding.

## 2023-03-20 NOTE — Telephone Encounter (Signed)
 Patient was made aware and will pick up information. Patient verbalized understanding. "Please send the following info to patient so she can provide it to that provider. They need to have a discussion to discuss her concerns so she can make an informed decision about whether or not to start using the topical vaginal estrogen cream as I advised since she has hesitations. "  Topical vaginal estrogen cream safe to use with breast cancer history  [email or web address]

## 2023-03-21 LAB — URINALYSIS, ROUTINE W REFLEX MICROSCOPIC
Bilirubin, UA: NEGATIVE
Glucose, UA: NEGATIVE
Nitrite, UA: NEGATIVE
Specific Gravity, UA: >1.03 — ABNORMAL HIGH (ref 1.005–1.030)
Urobilinogen, Ur: 0.2 mg/dL (ref 0.2–1.0)
pH, UA: 5.5 (ref 5.0–7.5)

## 2023-03-21 LAB — MICROSCOPIC EXAMINATION
RBC, Urine: >30 /HPF — ABNORMAL HIGH (ref 0–2)
WBC, UA: >30 /HPF — ABNORMAL HIGH (ref 0–5)

## 2023-03-22 ENCOUNTER — Encounter: Payer: Self-pay | Admitting: *Deleted

## 2023-03-22 NOTE — Progress Notes (Signed)
 Received message from pt PCP Evette Georges, FNP stating pt has been experiencing recurrent UTI's, vaginal dryness and vaginal atrophy requesting advice from MD if pt can proceed with vaginal estrogen creams.  MD states he would like to meet with pt in 2 weeks to further discuss.  Appt scheduled, pt notified and verbalized understanding.

## 2023-03-23 ENCOUNTER — Telehealth: Payer: Self-pay

## 2023-03-23 LAB — URINE CULTURE

## 2023-03-23 NOTE — Telephone Encounter (Addendum)
 Patient was made aware and verbalized understanding.   "No Macrobid wouldn't worsen the burning. She should stop it anyway since her urine culture was negative.  As previously advised: "Her persistent symptoms are likely due to post-infectious inflammation and even moreso her previously untreated vaginal atrophy. As discussed at LOV she is strongly advised to consider starting topical vaginal estrogen cream use". I already talked to her oncologist about that and she is seeing him on 04/05/23 to discuss it further.  If desired I can refer her to a GYN provider also since vaginal atrophy falls under their scope of practice more than under that of Urology."    Patient state's she not familiar with any GYN provider. Patient state's she saw GYN  at [facility] previously and all of the provider she saw have retired.  and was told there was nothing they could do for UTI. I made patient aware that UTI is not what GYN specialist in. Patient states's she is going to speak with her granddaughter whom started working a GYN office to see if that is were she would to be refer. Patient state's she will call back next week after talking with her granddaughter.

## 2023-03-23 NOTE — Telephone Encounter (Signed)
 Patient is made aware and verbalized understanding. Patient's state's she is still burning and want's to know if the Macrobid 100 mg can cause her to burn more. Patient state's when she started taking the ABT  and her burning intensified.She wants to know if there any other options.

## 2023-03-23 NOTE — Telephone Encounter (Signed)
-----   Message from Kim Krueger sent at 03/23/2023  9:06 AM EST ----- Please notify patient: Negative urine culture, no antibiotic needed at this time. Thanks.

## 2023-03-28 ENCOUNTER — Encounter (HOSPITAL_COMMUNITY): Payer: Medicare Other

## 2023-03-28 ENCOUNTER — Other Ambulatory Visit (HOSPITAL_COMMUNITY): Payer: Medicare Other

## 2023-03-29 NOTE — Assessment & Plan Note (Signed)
 01/15/2018: Screening detected left breast calcifications 0.5 cm 9 o'clock position 7 cm from the nipple.  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   Treatment plan:  Anastrozole 1 mg daily delayed due to family issues and stress.  Started 07/11/2019   Anastrozole toxicities: Denies any adverse effects to anastrozole therapy. Frequent UTIs: Patient is following with urology.  I discussed with her about avoiding vaginal dryness by using coconut oil. Bone density 12/21/2021: T-score -2.5: Osteoporosis: Recommended bisphosphonate therapy with calcium vitamin D and weightbearing exercises   Breast cancer surveillance: 1.  Breast exam 03/30/2023: Benign 2. mammogram 03/07/2023: 1.1 cm calcifications UIQ left breast, stereotactic biopsy: Fat necrosis   breast density category B   Return to clinic in 1 year for follow-up

## 2023-03-30 ENCOUNTER — Inpatient Hospital Stay: Attending: Hematology and Oncology | Admitting: Hematology and Oncology

## 2023-03-30 VITALS — BP 146/67 | HR 85 | Temp 98.4°F | Resp 18 | Ht 63.5 in | Wt 150.0 lb

## 2023-03-30 DIAGNOSIS — Z79899 Other long term (current) drug therapy: Secondary | ICD-10-CM | POA: Insufficient documentation

## 2023-03-30 DIAGNOSIS — Z780 Asymptomatic menopausal state: Secondary | ICD-10-CM | POA: Diagnosis not present

## 2023-03-30 DIAGNOSIS — C50212 Malignant neoplasm of upper-inner quadrant of left female breast: Secondary | ICD-10-CM

## 2023-03-30 DIAGNOSIS — R102 Pelvic and perineal pain: Secondary | ICD-10-CM | POA: Insufficient documentation

## 2023-03-30 DIAGNOSIS — B353 Tinea pedis: Secondary | ICD-10-CM | POA: Insufficient documentation

## 2023-03-30 DIAGNOSIS — Z79811 Long term (current) use of aromatase inhibitors: Secondary | ICD-10-CM | POA: Diagnosis not present

## 2023-03-30 DIAGNOSIS — Z170 Estrogen receptor positive status [ER+]: Secondary | ICD-10-CM | POA: Diagnosis not present

## 2023-03-30 DIAGNOSIS — N898 Other specified noninflammatory disorders of vagina: Secondary | ICD-10-CM | POA: Insufficient documentation

## 2023-03-30 MED ORDER — ESTRADIOL 0.1 MG/GM VA CREA: 1.0000 g | TOPICAL_CREAM | VAGINAL | 12 refills | Status: DC

## 2023-03-30 MED ORDER — MICONAZOLE NITRATE 2 % EX CREA: 1.0000 | TOPICAL_CREAM | Freq: Two times a day (BID) | CUTANEOUS | 3 refills | Status: DC

## 2023-03-30 NOTE — Progress Notes (Signed)
 Patient Care Team: Tisovec, Adelfa Koh, MD as PCP - General (Internal Medicine) Althea Charon, MD as Radiologist (Radiology) Lynden Ang, NP as Nurse Practitioner (Obstetrics and Gynecology) Serena Croissant, MD as Consulting Physician (Hematology and Oncology) Glenna Fellows, MD (Inactive) as Consulting Physician (General Surgery) Elwyn Reach (Neurology)  DIAGNOSIS:  Encounter Diagnoses  Name Primary?   Malignant neoplasm of upper-inner quadrant of left breast in female, estrogen receptor positive Yes   Post-menopausal     SUMMARY OF ONCOLOGIC HISTORY: Oncology History  Malignant neoplasm of upper-inner quadrant of left breast in female, estrogen receptor positive  01/15/2018 Initial Diagnosis   Screening detected left breast calcifications 0.5 cm 9 o'clock position 7 cm from the nipple.  Biopsy revealed IDC grade 1 through 2 with low-grade DCIS   02/28/2018 Surgery   Left lumpectomy: No residual in situ or invasive cancer seen.   03/27/2018 Genetic Testing   DICER1 VUS identified on the common hereditary cancer panel.  The Hereditary Gene Panel offered by [facility] includes sequencing and/or deletion duplication testing of the following 47 genes: APC, ATM, AXIN2, BARD1, BMPR1A, BRCA1, BRCA2, BRIP1, CDH1, CDK4, CDKN2A (p14ARF), CDKN2A (p16INK4a), CHEK2, CTNNA1, DICER1, EPCAM (Deletion/duplication testing only), GREM1 (promoter region deletion/duplication testing only), KIT, MEN1, MLH1, MSH2, MSH3, MSH6, MUTYH, NBN, NF1, NHTL1, PALB2, PDGFRA, PMS2, POLD1, POLE, PTEN, RAD50, RAD51C, RAD51D, SDHB, SDHC, SDHD, SMAD4, SMARCA4. STK11, TP53, TSC1, TSC2, and VHL.  The following genes were evaluated for sequence changes only: SDHA and HOXB13 c.251G>A variant only. The report date is 03/27/2018.  UPDATE: DICER1 c.1468C>T (p.Arg490Cys) has been reclassified to Likely Benign.  The amended report date is October 05, 2020.   06/2019 -  Anti-estrogen oral therapy   Anastrozole  1mg  daily     CHIEF COMPLIANT: Follow-up on anastrozole, complains of vaginal dryness and pain  HISTORY OF PRESENT ILLNESS:   History of Present Illness The patient, with a history of breast cancer, is currently on anastrozole and has been on this journey for four years. She reports that her biopsies were okay and she is happy with the improvement in the procedure. She has been experiencing recurrent UTIs for the last year and a half despite seeing multiple doctors and trying various treatments. The longest period of relief she has experienced is three months. Recently, she underwent a bladder wash and started using a cream that has provided significant relief. She also reports a skin condition between her toes that has been present for about six weeks. The condition causes occasional itching and has not spread recently. She has been using Benadryl cream and alcohol to manage the symptoms. The patient also has osteoporosis and is due for a bone density scan.     ALLERGIES:  is allergic to ceftin [cefuroxime axetil], hydrocodone, solifenacin, sulfa antibiotics, tramadol, and penicillins.  MEDICATIONS:  Current Outpatient Medications  Medication Sig Dispense Refill   anastrozole (ARIMIDEX) 1 MG tablet TAKE 1 TABLET(1 MG) BY MOUTH DAILY 90 tablet 3   estradiol (ESTRACE VAGINAL) 0.1 MG/GM vaginal cream Place 1 g vaginally 2 (two) times a week. 42.5 g 12   gabapentin (NEURONTIN) 300 MG capsule Take a 300 mg capsule three times a day for two weeks following surgery.Then take a 300 mg capsule two times a day for two weeks. Then take a 300 mg capsule once a day for two weeks. Then discontinue. 84 capsule 0   memantine (NAMENDA) 5 MG tablet Take 1 tablet twice a day 180 tablet 3   miconazole (  MICATIN) 2 % cream Apply 1 Application topically 2 (two) times daily. 56 g 3   nitrofurantoin (MACRODANTIN) 50 MG capsule Take 1 capsule (50 mg total) by mouth at bedtime. 90 capsule 3   acetaminophen (TYLENOL)  500 MG tablet Take 1,000 mg by mouth 2 (two) times daily. (Patient not taking: Reported on 03/30/2023)     No current facility-administered medications for this visit.    PHYSICAL EXAMINATION: ECOG PERFORMANCE STATUS: 1 - Symptomatic but completely ambulatory  Vitals:   03/30/23 1422  BP: (!) 146/67  Pulse: 85  Resp: 18  Temp: 98.4 F (36.9 C)  SpO2: 98%   Filed Weights   03/30/23 1422  Weight: 150 lb (68 kg)      LABORATORY DATA:  I have reviewed the data as listed    Latest Ref Rng & Units 11/22/2022    3:34 AM 11/16/2022   10:30 AM 07/15/2021    4:16 AM  CMP  Glucose 70 - 99 mg/dL 161  93  93   BUN 8 - 23 mg/dL 15  22  9    Creatinine 0.44 - 1.00 mg/dL 0.96  0.45  4.09   Sodium 135 - 145 mmol/L 139  141  137   Potassium 3.5 - 5.1 mmol/L 4.0  4.4  3.3   Chloride 98 - 111 mmol/L 111  110  105   CO2 22 - 32 mmol/L 21  23  24    Calcium 8.9 - 10.3 mg/dL 8.5  9.7  7.9     Lab Results  Component Value Date   WBC 6.5 11/22/2022   HGB 8.5 (L) 11/22/2022   HCT 28.4 (L) 11/22/2022   MCV 89.9 11/22/2022   PLT 203 11/22/2022   NEUTROABS 12.1 (H) 07/14/2021    ASSESSMENT & PLAN:  Malignant neoplasm of upper-inner quadrant of left breast in female, estrogen receptor positive 01/15/2018: Screening detected left breast calcifications 0.5 cm 9 o'clock position 7 cm from the nipple.  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   Treatment plan:  Anastrozole 1 mg daily delayed due to family issues and stress.  Started 07/11/2019   Anastrozole toxicities: Denies any adverse effects to anastrozole therapy. Frequent UTIs: Patient is following with urology.  I discussed with her about avoiding vaginal dryness by using coconut oil. Bone density 12/21/2021: T-score -2.5: Osteoporosis: Recommended bisphosphonate therapy with calcium vitamin D and weightbearing exercises   Breast  cancer surveillance: 1.  Breast exam 03/30/2023: Benign 2. mammogram 03/07/2023: 1.1 cm calcifications UIQ left breast, stereotactic biopsy: Fat necrosis   breast density category B   Vaginal dryness and pain: I sent a prescription for Estrace cream.  She will apply twice a week. Bone density test has been ordered for next year.  Return to clinic in 1 year for follow-up   Assessment & Plan Malignant neoplasm of upper-inner quadrant of left breast, estrogen receptor positive Recent biopsies showed benign fat necrosis. She tolerates anastrozole well. - Continue anastrozole for one more year to complete the five-year treatment plan.  Recurrent urinary tract infections (UTIs) Frequent UTIs over 18 months. Non-estrogen cream provided relief but burning persists. Estrogen cream recommended for local relief and UTI prevention. - Prescribe estrogen cream twice a week for local relief and UTI prevention. - Send prescription to [facility] on [address]. - Educated her on the safety and preventive benefits of estrogen cream.  Osteoporosis  Bone density scan in 2023 indicated onset of osteoporosis. Follow-up scan due every two years. - Order bone density scan for early next year at [facility].  Fungal infection of the foot Persistent itchy rash between toes likely fungal infection from pedicure, present for six weeks. - Prescribe antifungal ointment to be applied twice daily. - Send prescription to [facility] on [address].  Follow-up for breast cancer treatment Scheduled to complete five-year treatment next year. - Schedule follow-up appointment in one year to review completion of breast cancer treatment.      Orders Placed This Encounter  Procedures   DG Bone Density    Standing Status:   Future    Expiration Date:   03/29/2024    Reason for Exam (SYMPTOM  OR DIAGNOSIS REQUIRED):   post menopausal    Preferred imaging location?:   [facility]    Release to patient:   Immediate    The patient has a good understanding of the overall plan. she agrees with it. she will call with any problems that may develop before the next visit here. Total time spent: 30 mins including face to face time and time spent for planning, charting and co-ordination of care   Tamsen Meek, MD 03/30/23

## 2023-04-05 ENCOUNTER — Ambulatory Visit: Admitting: Hematology and Oncology

## 2023-04-25 ENCOUNTER — Other Ambulatory Visit: Payer: Self-pay | Admitting: Hematology and Oncology

## 2023-05-12 ENCOUNTER — Encounter: Payer: Self-pay | Admitting: Physician Assistant

## 2023-05-31 NOTE — Progress Notes (Addendum)
 NEUROPSYCHOLOGICAL EVALUATION [facility] [place] Department of Neurology  Date of Evaluation: Jun 01, 2023  Reason for Referral:   Kim Krueger is a 83 y.o. right-handed Caucasian female referred by Tex Filbert, PA-C, to characterize her current cognitive functioning and assist with diagnostic clarity and treatment planning in the context of a previously diagnosed amnestic mild neurocognitive disorder and concerns for progressive cognitive decline.   Assessment and Plan:   Clinical Impression(s): Kim Krueger pattern of performance is suggestive of severe impairment surrounding delayed retrieval aspects of memory. An isolated impairment was also exhibited across a line orientation task, while some variability was exhibited across response inhibition. However, other performances assessing visuospatial abilities and executive functioning respectively were appropriate. Variability was also exhibited across recognition/consolidation aspects of memory. Performances were appropriate relative to age-matched peers across processing speed, attention/concentration, safety/judgment, receptive and expressive language, and encoding aspects of memory. Functionally, outside of her husband taking over financial management and bill paying, Kim Krueger reported ongoing independence. Given significant memory impairment, she continues to best meet diagnostic criteria for a Mild Neurocognitive Disorder ("mild cognitive impairment") at the present time.  Relative to her previous evaluation in March 2024, Kim Krueger exhibited a good degree of stability. Some intra-domain variability was exhibited across visuospatial abilities (3/4 tasks saw mild decline where another task exhibited some mild improvement). However, outside of this, no other assessed cognitive domain exhibited clinically significant change.  The etiology for ongoing significant memory impairment remains uncertain. An  underlying neurodegenerative illness such as Alzheimer's disease remains plausible. Across memory testing, Kim Krueger was able to learn novel information reasonably well. After a brief delay, she was fully amnestic (i.e., 0% retention) across all three memory tasks, suggesting prominent concern for rapid forgetting which is a hallmark testing characteristic of Alzheimer's disease. However, she did demonstrate some benefit from cueing and other non-memory areas commonly implicated in this illness (i.e., semantic fluency, confrontation naming) were both stable and normatively appropriate. If this illness is indeed present and driving significant memory dysfunction, it would appear to still be in earlier stages with minimal progression relative to her previous evaluation. Continued medical monitoring will be important moving forward.  Recommendations: A repeat neuropsychological evaluation in 12-18 months could be considered to assess the trajectory of future cognitive decline should it occur.  Kim Krueger has already been prescribed a medication aimed to address memory loss and concerns surrounding Alzheimer's disease (i.e., memantine /Namenda ). She is encouraged to continue taking this medication as prescribed. It is important to highlight that this medication has been shown to slow functional decline in some individuals. There is no current treatment which can stop or reverse cognitive decline when caused by a neurodegenerative illness.   Performance across neurocognitive testing is not a strong predictor of an individual's safety operating a motor vehicle. Should her family wish to pursue a formalized driving evaluation, they could reach out to the following agencies: [employer] [facility] in [place]: [phone] [facility]: [phone] [facility]: [phone] [facility]: [phone] or [phone]  Should there be progression of current deficits over  time, Kim Krueger is unlikely to regain any independent living skills lost. Therefore, it is recommended that she remain as involved as possible in all aspects of household chores, finances, and medication management, with supervision to ensure adequate performance. She will likely benefit from the establishment and maintenance of a routine in order to maximize her functional abilities over time.  It will be important for Ms.  Krueger to have another person with her when in situations where she may need to process information, weigh the pros and cons of different options, and make decisions, in order to ensure that she fully understands and recalls all information to be considered.  If not already done, Kim Krueger and her family may want to discuss her wishes regarding durable power of attorney and medical decision making, so that she can have input into these choices. If they require legal assistance with this, long-term care resource access, or other aspects of estate planning, they could reach out to [employer] at [phone] for a free consultation.   Kim Krueger is encouraged to attend to lifestyle factors for brain health (e.g., regular physical exercise, good nutrition habits and consideration of the MIND-DASH diet, regular participation in cognitively-stimulating activities, and general stress management techniques), which are likely to have benefits for both emotional adjustment and cognition. Optimal control of vascular risk factors (including safe cardiovascular exercise and adherence to dietary recommendations) is encouraged. Continued participation in activities which provide mental stimulation and social interaction is also recommended.   Important information should be provided to Kim Krueger in written format in all instances. This information should be placed in a highly frequented and easily visible location within her home to promote recall. External strategies such as  written notes in a consistently used memory journal, visual and nonverbal auditory cues such as a calendar on the refrigerator or appointments with alarm, such as on a cell phone, can also help maximize recall.  To address problems with fluctuating attention and/or executive dysfunction, she may wish to consider:   -Avoiding external distractions when needing to concentrate   -Limiting exposure to fast paced environments with multiple sensory demands   -Writing down complicated information and using checklists   -Attempting and completing one task at a time (i.e., no multi-tasking)   -Verbalizing aloud each step of a task to maintain focus   -Taking frequent breaks during the completion of steps/tasks to avoid fatigue   -Reducing the amount of information considered at one time   -Scheduling more difficult activities for a time of day where she is usually most alert  Review of Records:   Kim Krueger was seen by [facility] Aaron AasRayfield Cairo, M.D.) on 10/05/2015 due to symptoms of temporal arteritis. She was followed and treated by Dr. Ty Gales over the next few years, with her last appointment dated 03/21/2017. Throughout these appointments, no memory concerns were raised by either Dr. Ty Gales or Kim Krueger and her husband/family.   She was seen in the ED on 07/13/2021 with symptoms of generalized weakness, poor oral intake, and fever. Confusion was noted at the time of her admission and she reportedly had a 102 degree fever. She was ultimately determined to have sepsis and acute metabolic encephalopathy secondary to an underlying UTI. She responded well to treatment and was discharged home on 07/15/2021.    She was seen by [facility] Tex Filbert, PA-C) on 07/22/2021 for an evaluation of memory loss. At that time, memory dysfunction was at least in part related to her recent hospitalization. However, her husband had expressed some concerns surrounding mildly diminished memory abilities for  about six months prior. Examples included not remembering the date and time, as well as trouble recalling details of recent conversations. She was also noted to misplace objects around her environment. Performance on a brief cognitive screening instrument (MOCA) was 17/30. Ultimately, Ms. Estupinan was referred for a comprehensive neuropsychological evaluation to  characterize her cognitive abilities and to assist with diagnostic clarity and treatment planning.    She completed a comprehensive neuropsychological evaluation with myself on 04/13/2022. Results suggested prominent impairment surrounding delayed retrieval and recognition/consolidation aspects of learning and memory. Isolated deficits were also noted across response inhibition and a line orientation task. However, all other performances assessing executive functioning and visuospatial abilities respectively were appropriate. She denied functional concerns and was ultimately diagnosed with a mild neurocognitive disorder. Concerns for underlying Alzheimer's disease were expressed at that time. Her hospitalization and subsequent bout of metabolic encephalopathy was also raised as a possible influencer of cognitive abilities. Repeat testing was recommended.  Ultimately, Ms. Kaney was referred for a comprehensive neuropsychological evaluation to characterize her cognitive abilities and to assist with diagnostic clarity and treatment planning.   Neuroimaging: Brain MRI on 08/13/2021 revealed moderate generalized parenchymal volume loss, as well as mild microvascular ischemic disease.   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  The Hereditary Gene Panel offered by [facility]  includes sequencing and/or deletion duplication testing of the following 47 genes: APC, ATM, AXIN2, BARD1, BMPR1A, BRCA1, BRCA2, BRIP1, CDH1, CDK4, CDKN2A (p14ARF), CDKN2A (p16INK4a), CHEK2, CTNNA1, DICER1, EPCAM (Deletion/duplication testing only), GREM1 (promoter region deletion/duplication   History of uterine cancer 03/14/2018   s/p hysterectomy   Hypokalemia 07/14/2021   Hypothyroidism    Impingement syndrome of right shoulder region 03/13/2021   Malignant neoplasm of upper-inner quadrant of left breast in female, estrogen receptor positive 02/12/2018   Metatarsalgia of right foot 03/13/2021   OAB (overactive bladder) 12/13/2021   Osteoarthritis of left knee 03/16/2018   Osteoporosis 12/28/2021   Pain in joint of right shoulder 03/13/2021   Pain in left knee 12/13/2017   Prolonged QT interval 07/14/2021   Sepsis due to undetermined organism 07/14/2021   Temporal arteritis    Urge incontinence    UTI (urinary tract infection) 07/14/2021    Past Surgical History:  Procedure Laterality Date   ABDOMINAL HYSTERECTOMY     ovaries removed as well   APPENDECTOMY     appendix ruptured as child, still have part of appendix   ARTERY BIOPSY Right 10/19/2015   Procedure: RIGHT TEMPORAL ARTERY BIOPSY;  Surgeon: Jerryl Morin, MD;  Location: [facility];  Service: General;  Laterality: Right;   BREAST BIOPSY Left 12/2017   INVASIVE DUCTAL CARCINOMA, LOCALIZED, GRADE 1 TO 2.   BREAST BIOPSY Left 03/13/2023   MM LT BREAST BX W LOC DEV 1ST LESION IMAGE BX SPEC STEREO GUIDE 03/13/2023 [facility]   BREAST LUMPECTOMY Left 02/28/2018   BREAST LUMPECTOMY WITH RADIOACTIVE SEED LOCALIZATION Left 02/28/2018   Procedure: LEFT BREAST LUMPECTOMY WITH RADIOACTIVE SEED LOCALIZATION;  Surgeon: Ayesha Lente, MD;  Location: [facility];  Service: General;  Laterality: Left;   HERNIA REPAIR  03/2016   umbilical   INSERTION OF MESH N/A 03/28/2016   Procedure: INSERTION OF  MESH;  Surgeon: Jerryl Morin, MD;  Location: [facility];  Service: General;  Laterality: N/A;   TOTAL KNEE ARTHROPLASTY Left 11/21/2022   Procedure: TOTAL KNEE ARTHROPLASTY;  Surgeon: Liliane Rei, MD;  Location: [facility];  Service: Orthopedics;  Laterality: Left;   UMBILICAL HERNIA REPAIR N/A 03/28/2016  Procedure: LAPAROSCOPIC UMBILICAL HERNIA;  Surgeon: Jerryl Morin, MD;  Location: [facility];  Service: General;  Laterality: N/A;    Current Outpatient Medications:    acetaminophen  (TYLENOL ) 500 MG tablet, Take 1,000 mg by mouth 2 (two) times daily. (Patient not taking: Reported on 03/30/2023), Disp: , Rfl:    anastrozole  (ARIMIDEX ) 1 MG tablet, TAKE 1 TABLET(1 MG) BY MOUTH DAILY, Disp: 90 tablet, Rfl: 3   estradiol  (ESTRACE  VAGINAL) 0.1 MG/GM vaginal cream, Place 1 g vaginally 2 (two) times a week., Disp: 42.5 g, Rfl: 12   gabapentin  (NEURONTIN ) 300 MG capsule, Take a 300 mg capsule three times a day for two weeks following surgery.Then take a 300 mg capsule two times a day for two weeks. Then take a 300 mg capsule once a day for two weeks. Then discontinue., Disp: 84 capsule, Rfl: 0   memantine  (NAMENDA ) 5 MG tablet, Take 1 tablet twice a day, Disp: 180 tablet, Rfl: 3   miconazole  (MICATIN) 2 % cream, Apply 1 Application topically 2 (two) times daily., Disp: 56 g, Rfl: 3   nitrofurantoin  (MACRODANTIN ) 50 MG capsule, Take 1 capsule (50 mg total) by mouth at bedtime., Disp: 90 capsule, Rfl: 3  Clinical Interview:   The following information was obtained during a clinical interview with Kim Krueger prior to cognitive testing.  Cognitive Symptoms: Decreased short-term memory: Denied. Kim Krueger continued to deny prominent memory changes or her observation of progressive decline relative to her previous March 2024 evaluation. Previously, her husband had reported mild memory dysfunction predating her 2023 hospitalization by about six months. Examples included her being more repetitive in conversation and  misplacing objects in her environment. The latter had become such an issue that he had placed a tracking device in her purse due to her misplacing it so regularly.   Decreased long-term memory: Denied. Decreased attention/concentration: Denied. Reduced processing speed: Denied. Difficulties with executive functions: Denied. Her husband previously noted that Kim Krueger had seemed to become less organized over time. However, he also wondered if this was more due to her not caring as strongly as she has in the past. They denied trouble with impulsivity or any significant personality changes. This was stable. Difficulties with emotion regulation: Denied. Difficulties with receptive language: Denied. Difficulties with word finding: Denied. Decreased visuoperceptual ability: Denied.   Difficulties completing ADLs: Generally denied. Kim Krueger reported independence with medication management. Her husband had become more involved in medication management following her June 2023 hospitalization. He also took over Landscape architect and bill paying during that time and has maintained control of this to present day. Ms. Schmelzer continues to drive and reported no driving-related concerns.  Additional Medical History: History of traumatic brain injury/concussion: Denied. History of stroke: Denied. History of seizure activity: Denied. History of known exposure to toxins: Denied. Symptoms of chronic pain: Largely denied. She previously described ongoing knee pain and reported undergoing a knee replacement procedure this past November without complication. Currently, she did describe being diagnosed with "vaginal aphasia," noting pain and burning sensations, commonly alleviated by using an ice pack. It was unclear if she meant to use the term vaginal atrophy. She did report that prescribed medications were helpful and that she felt about 85% improved currently.  Experience of frequent  headaches/migraines: Denied. Frequent instances of dizziness/vertigo: Denied.  Sensory changes: She wears glasses with benefit. Other sensory changes/difficulties (i.e., hearing, taste, smell) were denied.  Balance/coordination difficulties: Denied. She does use a cane for added stability while ambulating. No recent falls  were reported.  Other motor difficulties: Denied.  Sleep History: Estimated hours obtained each night: 8 hours.  Difficulties falling asleep: Denied. Difficulties staying asleep: Denied outside of instances where she will wake to use the restroom.  Feels rested and refreshed upon awakening: Endorsed.   History of snoring: Denied. History of waking up gasping for air: Denied. Witnessed breath cessation while asleep: Denied.   History of vivid dreaming: Denied. Excessive movement while asleep: Denied. Instances of acting out her dreams: Denied.  Psychiatric/Behavioral Health History: Depression: She described her mood as "okay" and denied to her knowledge any previous mental health concerns or formal diagnoses. Current or remote suicidal ideation, intent, or plan was denied.  Anxiety: Denied. Mania: Denied. Trauma History: Denied. Visual/auditory hallucinations: Denied. Delusional thoughts: Denied.   Tobacco: Denied. Alcohol: She denied current alcohol consumption as well as a history of problematic alcohol abuse.  Recreational drugs: Denied.  Family History: Problem Relation Age of Onset   Colon cancer Mother 53       d. 52   Heart disease Father    Dementia Maternal Grandmother    Alzheimer's disease Maternal Grandmother    Dementia Maternal Uncle    Alzheimer's disease Maternal Uncle    Ovarian cancer Cousin        died at 40; pat first cousin   Alzheimer's disease Cousin    Dementia Cousin 51       mat first cousin   This information was confirmed by Kim Krueger.  Academic/Vocational History: Highest level of educational attainment: 14 years.  She graduated from high school and completed two additional years of college. She described herself as a good (A/B) student in academic settings. No relative weaknesses were described.  History of developmental delay: Denied. History of grade repetition: Denied. Enrollment in special education courses: Denied. History of LD/ADHD: Denied.   Employment: Retired. 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   Evaluation Results:   Behavioral Observations: Ms. Oleksy was unaccompanied, arrived to her appointment on time, and was appropriately dressed and groomed. She appeared alert. Observed gait and station was slowed but generally within normal limits. She utilized a cane for added stability. Gross motor functioning appeared intact upon informal observation and no abnormal movements (e.g., tremors) were noted. Her affect was generally relaxed and positive. Thought processes were coherent, organized, and normal in content. Insight into her cognitive difficulties appeared limited in that she continued to deny memory dysfunction despite previous and current testing suggesting significant impairments.   During testing, sustained attention was appropriate. Task engagement was adequate and she persisted when challenged. Overall, Ms. Spidle was cooperative with the clinical interview and subsequent testing procedures.   Adequacy of Effort: The validity of neuropsychological testing is limited by the extent to which the individual being tested may be assumed to have exerted adequate effort during testing. Ms. Fesler expressed her intention to perform to the best of her abilities and exhibited adequate task engagement and persistence. Scores across stand-alone and embedded performance validity measures were within expectation. As such, the results of the current evaluation are believed to be a valid representation of Ms. Osterlund's current  cognitive functioning.  Test Results: Ms. Nebel was mildly disoriented at the time of the current evaluation. She was unable to state the current date, was two days off when stating the current day of the week, and estimated it being 3:15 pm despite having a 1:00 appointment.   Intellectual abilities based upon  educational and vocational attainment were estimated to be in the average range. Premorbid abilities were estimated to be within the above average range based upon a single-word reading test.   Processing speed was variable but overall appropriate, ranging from the below average to well above average normative ranges. Basic attention was exceptionally high. More complex attention (e.g., working memory) was average to well above average. Response inhibition was variable, ranging from the exceptionally low to average normative ranges. Other aspects of executive functioning were average. She also performed in the above average range across a task assessing safety and judgment.  Assessed receptive language abilities were above average. Likewise, Ms. Pugsley did not exhibit any difficulties comprehending task instructions and answered all questions asked of her appropriately. Assessed expressive language (e.g., verbal fluency and confrontation naming) was average to well above average.     Assessed visuospatial/visuoconstructional abilities were somewhat variable. Performance across a line orientation task was exceptionally low. Outside of this, performances ranged from the below average to above average normative ranges. Points were lost on her drawing of a clock due to obvious numerical spacing abnormalities and incorrect hand placement.     Learning (i.e., encoding) of novel verbal information was average. Spontaneous delayed recall (i.e., retrieval) of previously learned information was exceptionally low. Retention rates were 0% across a list learning task, 0% across a story learning task,  and 0% across a figure drawing task. Performance across recognition tasks was variable, ranging from the exceptionally low to average normative ranges, suggesting some limited evidence for information consolidation.   Results of emotional screening instruments suggested that recent symptoms of generalized anxiety were in the minimal range, while symptoms of depression were within normal limits. A screening instrument assessing recent sleep quality suggested the presence of minimal sleep dysfunction.  Table of Scores:   Note: This summary of test scores accompanies the interpretive report and should not be considered in isolation without reference to the appropriate sections in the text. Descriptors are based on appropriate normative data and may be adjusted based on clinical judgment. Terms such as "Within Normal Limits" and "Outside Normal Limits" are used when a more specific description of the test score cannot be determined. Descriptors refer to the current evaluation only.        Percentile - Normative Descriptor > 98 - Exceptionally High 91-97 - Well Above Average 75-90 - Above Average 25-74 - Average 9-24 - Below Average 2-8 - Well Below Average < 2 - Exceptionally Low        Validity: March 2024 Current  DESCRIPTOR        DCT: --- --- --- Within Normal Limits  RBANS EI: --- --- --- Within Normal Limits  WAIS-IV RDS: --- --- --- Within Normal Limits        Orientation:       Raw Score Raw Score Percentile   NAB Orientation, Form 1 27/29 24/29 --- ---        Cognitive Screening:       Raw Score Raw Score Percentile   SLUMS: 24/30 16/30 --- ---        RBANS, Form A: Standard Score/ Scaled Score Standard Score/ Scaled Score Percentile   Total Score 78 80 9 Below Average  Immediate Memory 94 94 34 Average    List Learning 9 9 37 Average    Story Memory 9 9 37 Average  Visuospatial/Constructional 84 81 10 Below Average    Figure Copy 9 12 75 Above Average  Line Orientation  12/20 5/20 <2 Exceptionally Low  Language 90 97 42 Average    Picture Naming 10/10 10/10 >75 Above Average    Semantic Fluency 6 8 25  Average  Attention 106 109 73 Average    Digit Span 16 17 99 Exceptionally High    Coding 6 6 9  Below Average  Delayed Memory 40 44 <1 Exceptionally Low    List Recall 0/10 0/10 <2 Exceptionally Low    List Recognition 11/20 13/20 <2 Exceptionally Low    Story Recall 1 1 <1 Exceptionally Low    Story Recognition 10/12 8/12 14-28 Below Average  to Average    Figure Recall 3 1 <1 Exceptionally Low    Figure Recognition 3/8 4/8 21-38 Below Average  to Average         Intellectual Functioning:       Standard Score Standard Score Percentile   Test of Premorbid Functioning: 122 119 90 Above Average        Attention/Executive Function:      Trail Making Test (TMT): Raw Score (T Score) Raw Score (T Score) Percentile     Part A 39 secs.,  0 errors (45) 45 secs.,  0 errors (43) 25 Average    Part B 95 secs.,  1 error (49) 153 secs.,  2 errors (44) 27 Average          Scaled Score Scaled Score Percentile   WAIS-IV Digit Span: 14 15 95 Well Above Average    Forward 17 18 >99 Exceptionally High    Backward 14 14 91 Well Above Average    Sequencing 10 11 63 Average         Scaled Score Scaled Score Percentile   WAIS-IV Similarities: 8 10 50 Average        D-KEFS Color-Word Interference Test: Raw Score (Scaled Score) Raw Score (Scaled Score) Percentile     Color Naming 30 secs. (11) 34 secs. (11) 63 Average    Word Reading 17 secs. (14) 18 secs. (14) 91 Well Above Average    Inhibition 74 secs. (10) 79 secs. (12) 75 Above Average      Total Errors 0 errors (13) 3 errors (10) 50 Average    Inhibition/Switching 180 secs. (1) 172 secs. (1) <1 Exceptionally Low      Total Errors 10 errors (3) 17 errors (1) <1 Exceptionally Low        NAB Executive Functions Module, Form 1: T Score T Score Percentile     Judgment 57 59 82 Above Average         Language:      Verbal Fluency Test: Raw Score (Scaled Score) Raw Score (Scaled Score) Percentile     Phonemic Fluency (CFL) 35 (10) 37 (11) 63 Average    Category Fluency 34 (9) 33 (9) 37 Average  *Based on [employer] Older Normative Studies (MOANS)            NAB Language Module, Form 1: T Score T Score Percentile     Auditory Comprehension 58 58 79 Above Average    Naming 30/31 (60) 30/31 (63) 91 Well Above Average        Visuospatial/Visuoconstruction:       Raw Score Raw Score Percentile   Clock Drawing: 9/10 7/10 --- Within Normal Limits         Scaled Score Scaled Score Percentile   WAIS-IV Block Design: 10 7 16  Below Average        Mood and  Personality:       Raw Score Raw Score Percentile   Geriatric Depression Scale: 1 2 --- Within Normal Limits  Geriatric Anxiety Scale: 2 6 --- Minimal    Somatic 2 3 --- Minimal    Cognitive 0 2 --- Minimal    Affective 0 1 --- Minimal        Additional Questionnaires:       Raw Score Raw Score Percentile   PROMIS Sleep Disturbance Questionnaire: 16 22 --- None to Slight   Informed Consent and Coding/Compliance:   The current evaluation represents a clinical evaluation for the purposes previously outlined by the referral source and is in no way reflective of a forensic evaluation.   Ms. Baranski was provided with a verbal description of the nature and purpose of the present neuropsychological evaluation. Also reviewed were the foreseeable risks and/or discomforts and benefits of the procedure, limits of confidentiality, and mandatory reporting requirements of this provider. The patient was given the opportunity to ask questions and receive answers about the evaluation. Oral consent to participate was provided by the patient.   This evaluation was conducted by Melville Stade, Ph.D., ABPP-CN, board certified clinical neuropsychologist. Ms. Colantonio completed a clinical interview with Dr. Kitty Perkins, billed as one unit [phone], and 130 minutes of  cognitive testing and scoring, billed as one unit [phone] and three additional units 96139. Psychometrist Arnulfo Larch, B.S. assisted Dr. Kitty Perkins with test administration and scoring procedures. As a separate and discrete service, one unit [phone] and two units 96133 (160 minutes) were billed for Dr. Arsenio Larger time spent in interpretation and report writing.

## 2023-06-01 ENCOUNTER — Ambulatory Visit: Payer: Medicare Other | Admitting: Psychology

## 2023-06-01 ENCOUNTER — Ambulatory Visit: Payer: Self-pay | Admitting: Psychology

## 2023-06-01 DIAGNOSIS — G3184 Mild cognitive impairment, so stated: Secondary | ICD-10-CM

## 2023-06-01 DIAGNOSIS — R4189 Other symptoms and signs involving cognitive functions and awareness: Secondary | ICD-10-CM

## 2023-06-01 NOTE — Progress Notes (Signed)
   Psychometrician Note   Cognitive testing was administered to [employer] by Arnulfo Larch, B.S. (psychometrist) under the supervision of Dr. Melville Stade, Ph.D., ABPP, licensed psychologist on 06/01/2023. Ms. Danielski did not appear overtly distressed by the testing session per behavioral observation or responses across self-report questionnaires. Rest breaks were offered.    The battery of tests administered was selected by Dr. Zachary C. Merz, Ph.D., ABPP with consideration to Ms. Waidelich's current level of functioning, the nature of her symptoms, emotional and behavioral responses during interview, level of literacy, observed level of motivation/effort, and the nature of the referral question. This battery was communicated to the psychometrist. Communication between Dr. Melville Stade, Ph.D., ABPP and the psychometrist was ongoing throughout the evaluation and Dr. Melville Stade, Ph.D., ABPP was immediately accessible at all times. Dr. Zachary C. Merz, Ph.D., ABPP provided supervision to the psychometrist on the date of this service to the extent necessary to assure the quality of all services provided.    KATYE HAGY will return within approximately 1-2 weeks for an interactive feedback session with Dr. Kitty Perkins at which time her test performances, clinical impressions, and treatment recommendations will be reviewed in detail. Ms. Perrier understands she can contact our office should she require our assistance before this time.  A total of 130 minutes of billable time were spent face-to-face with Ms. Schieffer by the psychometrist. This includes both test administration and scoring time. Billing for these services is reflected in the clinical report generated by Dr. Melville Stade, Ph.D., ABPP  This note reflects time spent with the psychometrician and does not include test scores or any clinical interpretations made by Dr. Kitty Perkins. The full report will follow in a separate note.

## 2023-06-08 ENCOUNTER — Encounter: Payer: Medicare Other | Admitting: Psychology

## 2023-06-13 ENCOUNTER — Ambulatory Visit: Admitting: Psychology

## 2023-06-13 DIAGNOSIS — G3184 Mild cognitive impairment, so stated: Secondary | ICD-10-CM

## 2023-06-13 NOTE — Progress Notes (Signed)
   Neuropsychology Feedback Session Tommas Fragmin. [facility] Department of Neurology  Reason for Referral:   Kim Krueger is a 83 y.o. right-handed Caucasian female referred by Tex Filbert, PA-C, to characterize her current cognitive functioning and assist with diagnostic clarity and treatment planning in the context of a previously diagnosed amnestic mild neurocognitive disorder and concerns for progressive cognitive decline.   Feedback:   Ms. Lamson completed a comprehensive neuropsychological evaluation on 06/01/2023. Please refer to that encounter for the full report and recommendations. Briefly, results suggested severe impairment surrounding delayed retrieval aspects of memory. An isolated impairment was also exhibited across a line orientation task, while some variability was exhibited across response inhibition. However, other performances assessing visuospatial abilities and executive functioning respectively were appropriate. Variability was also exhibited across recognition/consolidation aspects of memory. Relative to her previous evaluation in March 2024, Ms. Hurd exhibited a good degree of stability. Some intra-domain variability was exhibited across visuospatial abilities (3/4 tasks saw mild decline where another task exhibited some mild improvement). However, outside of this, no other assessed cognitive domain exhibited clinically significant change. The etiology for ongoing significant memory impairment remains uncertain. An underlying neurodegenerative illness such as Alzheimer's disease remains plausible. Across memory testing, Ms. Keeler was able to learn novel information reasonably well. After a brief delay, she was fully amnestic (i.e., 0% retention) across all three memory tasks, suggesting prominent concern for rapid forgetting which is a hallmark testing characteristic of Alzheimer's disease. However, she did demonstrate some benefit from cueing and  other non-memory areas commonly implicated in this illness (i.e., semantic fluency, confrontation naming) were both stable and normatively appropriate. If this illness is indeed present and driving significant memory dysfunction, it would appear to still be in earlier stages with minimal progression relative to her previous evaluation.   Ms. Herling was accompanied by her husband during the current feedback session. Content of the current session focused on the results of her neuropsychological evaluation. Ms. Sarff was given the opportunity to ask questions and her questions were answered. She was encouraged to reach out should additional questions arise. A copy of her report was provided at the conclusion of the visit.      One unit 96132 (31 minutes) was billed for Dr. Arsenio Larger time spent preparing for, conducting, and documenting the current feedback session with Ms. Finnell.

## 2023-06-26 ENCOUNTER — Ambulatory Visit: Payer: Medicare Other | Admitting: Urology

## 2023-07-10 ENCOUNTER — Encounter: Payer: Self-pay | Admitting: Physician Assistant

## 2023-07-10 ENCOUNTER — Ambulatory Visit: Admitting: Physician Assistant

## 2023-07-10 VITALS — BP 134/83 | HR 84 | Wt 159.0 lb

## 2023-07-10 DIAGNOSIS — G3184 Mild cognitive impairment, so stated: Secondary | ICD-10-CM | POA: Diagnosis not present

## 2023-07-10 MED ORDER — MEMANTINE HCL 10 MG PO TABS: ORAL_TABLET | ORAL | 3 refills | Status: AC

## 2023-07-10 NOTE — Patient Instructions (Signed)
 .It was a pleasure to see you today at our office.   Recommendations:   Follow up in 6  months. Increase memantine10 mg twice daily, side effects discussed Recommend good control of cardiovascular risk factors  Whom to call:  Memory  decline, memory medications: Call our office [phone]   For psychiatric meds, mood meds: Please have your primary care physician manage these medications.   For assessment of decision of mental capacity and competency:  Call Dr. Rosaline Nine, geriatric psychiatrist at [phone]  For guidance in geriatric dementia issues please call [facility] [phone]  For guidance regarding [facility] and if placement were needed at the facility, contact Nat Hock, Social Worker tel: [phone]  If you have any severe symptoms of a stroke, or other severe issues such as confusion,severe chills or fever, etc call 911 or go to the ER as you may need to be evaluated further   Feel free to visit Facebook page  [employer] for tips of how to care for people with memory problems.      RECOMMENDATIONS FOR ALL PATIENTS WITH MEMORY PROBLEMS: 1. Continue to exercise (Recommend 30 minutes of walking everyday, or 3 hours every week) 2. Increase social interactions - continue going to [place] and enjoy social gatherings with friends and family 3. Eat healthy, avoid fried foods and eat more fruits and vegetables 4. Maintain adequate blood pressure, blood sugar, and blood cholesterol level. Reducing the risk of stroke and cardiovascular disease also helps promoting better memory. 5. Avoid stressful situations. Live a simple life and avoid aggravations. Organize your time and prepare for the next day in anticipation. 6. Sleep well, avoid any interruptions of sleep and avoid any distractions in the bedroom that may interfere with adequate sleep quality 7. Avoid sugar, avoid sweets as there is a strong link between excessive sugar intake,  diabetes, and cognitive impairment We discussed the Mediterranean diet, which has been shown to help patients reduce the risk of progressive memory disorders and reduces cardiovascular risk. This includes eating fish, eat fruits and green leafy vegetables, nuts like almonds and hazelnuts, walnuts, and also use olive oil. Avoid fast foods and fried foods as much as possible. Avoid sweets and sugar as sugar use has been linked to worsening of memory function.  There is always a concern of gradual progression of memory problems. If this is the case, then we may need to adjust level of care according to patient needs. Support, both to the patient and caregiver, should then be put into place.    FALL PRECAUTIONS: Be cautious when walking. Scan the area for obstacles that may increase the risk of trips and falls. When getting up in the mornings, sit up at the edge of the bed for a few minutes before getting out of bed. Consider elevating the bed at the head end to avoid drop of blood pressure when getting up. Walk always in a well-lit room (use night lights in the walls). Avoid area rugs or power cords from appliances in the middle of the walkways. Use a walker or a cane if necessary and consider physical therapy for balance exercise. Get your eyesight checked regularly.  FINANCIAL OVERSIGHT: Supervision, especially oversight when making financial decisions or transactions is also recommended.  HOME SAFETY: Consider the safety of the kitchen when operating appliances like stoves, microwave oven, and blender. Consider having supervision and share cooking responsibilities until no longer able to participate in those. Accidents with firearms and other hazards  in the house should be identified and addressed as well.   ABILITY TO BE LEFT ALONE: If patient is unable to contact 911 operator, consider using Life[employer], or when the need is there, arrange for someone to stay with patients. Smoking is a fire hazard,  consider supervision or cessation. Risk of wandering should be assessed by caregiver and if detected at any point, supervision and safe proof recommendations should be instituted.  MEDICATION SUPERVISION: Inability to self-administer medication needs to be constantly addressed. Implement a mechanism to ensure safe administration of the medications.   DRIVING: Regarding driving, in patients with progressive memory problems, driving will be impaired. We advise to have someone else do the driving if trouble finding directions or if minor accidents are reported. Independent driving assessment is available to determine safety of driving.   If you are interested in the driving assessment, you can contact the following:  [facility] [employer] [facility] in [place] [phone]  [facility] [phone]  [facility] [phone]  [facility] [phone] or [phone]

## 2023-07-10 NOTE — Progress Notes (Signed)
 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 RH female with a history of hypertension, hyperlipidemia, recent sepsis (06/2021), history of temporal arteritis, hypothyroidism, OAB with recurring UTIs, prolonged QT interval, anemia and a diagnosis of amnestic MCI per neuropsych evaluation in 2024 and 05/2023, seen today in follow up for memory loss. Patient is currently on memantine  5 mg twice daily, tolerating well.MMSE today is 26/30.  Discussed increasing memantine  to 10 mg twice daily given the recent neuropsych evaluation results as well as her performance today, she agrees to proceed.  Mood is stable.  She is able to participate on her ADLs, and continues to drive.      Follow up in 6 months. Repeat neuropsych evaluation in 12 months for diagnostic clarity and disease trajectory Continue memantine , increase to 10 mg twice daily, side effects discussed  Recommend good control of her cardiovascular risk factors Continue to control mood as per PCP     Subjective:    This patient is accompanied in the office by her husband who supplements the history.  Previous records as well as any outside records available were reviewed prior to todays visit. Patient was last seen on 02/10/2023 with MMSE 30/30    Any changes in memory since last visit?   May be worse especially when tired.  She has some difficulty with short-term memory including recent conversations and new information, names of people.  She tries to read , watching the news on her phone.  She does not do as many crossword puzzles as before. repeats oneself?  Endorsed by her husband Disoriented when walking into a room? Denies    Leaving objects?  May misplace things but not in unusual places   Wandering behavior?  denies   Any personality changes since last visit?  Denies.   Any worsening depression?:  Denies.   Hallucinations or paranoia?  Denies.   Seizures?  denies    Any sleep changes?  Overall sleeps well. Denies vivid dreams, REM behavior or sleepwalking   Sleep apnea?   Denies.   Any hygiene concerns? Denies.  Independent of bathing and dressing?  Endorsed  Does the patient needs help with medications?  Patient is in husband charge   Who is in charge of the finances?  Husband is in charge     Any changes in appetite?  Denies.      Patient have trouble swallowing? Denies.   Does the patient cook? No Any headaches?   denies   Any vision changes? She has cataracts  Chronic pain?  Yes due to knee arthritis Ambulates with difficulty?  She has bilateral knee OA, limiting her mobility, uses a cane for stability  Recent falls or head injuries? Denies.     Unilateral weakness, numbness or tingling? denies   Any tremors?  Denies    Any anosmia?  Denies   Any incontinence of urine?  Endorsed, history of frequent UTIs.   Any bowel dysfunction?   Denies      Patient lives with her husband     Does the patient drive?  Endorsed, short and long distances.   Neuropsych evaluation 06/01/2023 Briefly, results suggested severe impairment surrounding delayed retrieval aspects of memory. An isolated impairment was also exhibited across a line orientation task, while some variability was exhibited across response inhibition. However, other performances assessing visuospatial abilities and executive functioning respectively were appropriate. Variability was also exhibited across recognition/consolidation aspects of  memory. Relative to her previous evaluation in March 2024, Kim Krueger exhibited a good degree of stability. Some intra-domain variability was exhibited across visuospatial abilities (3/4 tasks saw mild decline where another task exhibited some mild improvement). However, outside of this, no other assessed cognitive domain exhibited clinically significant change. The etiology for ongoing significant memory impairment remains uncertain. An underlying  neurodegenerative illness such as Alzheimer's disease remains plausible. Across memory testing, Kim Krueger was able to learn novel information reasonably well. After a brief delay, she was fully amnestic (i.e., 0% retention) across all three memory tasks, suggesting prominent concern for rapid forgetting which is a hallmark testing characteristic of Alzheimer's disease. However, she did demonstrate some benefit from cueing and other non-memory areas commonly implicated in this illness (i.e., semantic fluency, confrontation naming) were both stable and normatively appropriate. If this illness is indeed present and driving significant memory dysfunction, it would appear to still be in earlier stages with minimal progression relative to her previous evaluation.      Initial visit 07/22/21  How long did patient have memory difficulties?  She reports that her memory worsened  over the last  4-6  months. Took a medicine when I was in the hospital  that may have had something to do with it Patient lives with: Spouse who reports that her memory is worse over the last 6 months  couldn't remember the date and time . Daughter reports that her memory deficits arelonger than that,maybe 9 mo to  year,  writing things all the time such as conversations and appointments because she otherwise could not remember  repeats oneself? Endorsed Disoriented when walking into a room?  Patient denies   Leaving objects in unusual places?  Patient denies  but  husband  reports that she places her purse anywhere.  The other day she cod not remember where she left her phone we moved the house upside down and then she realized the phone was in her pocket   Ambulates  with difficulty?   Patient denies   Recent falls?  Endorsed  I have some knee issues so  I use a walker when needed  Any head injuries?  Patient denies   History of seizures?   Patient denies   Wandering behavior?  Patient denies   Patient drives?   Patient  drives very short distances  to the bank, beauty salon, grocery and drugstore Any mood changes such irritability agitation? She is a lot nicer than she was- daughter says  Any history of depression?:  Patient denies   Hallucinations?  Patient denies at this moment, only when I was in the hospital   Paranoia?  Patient denies   Patient reports that he sleeps well without vivid dreams, REM behavior or sleepwalking   History of sleep apnea?  Patient denies   Any hygiene concerns?  Patient denies   Independent of bathing and dressing?  Endorsed  Does the patient needs help with medications?  Husband in charge Who is in charge of the finances? Husband is in charge    Any changes in appetite?  Patient denies, but  she doesn't drink enough water   Patient have trouble swallowing? Patient denies   Does the patient cook?  Patient denies   Any kitchen accidents such as leaving the stove on? Husband does the cooking  Any headaches?  Patient denies  TA  Double vision? Patient denies   Any focal numbness or tingling?  Patient denies   Chronic back pain Patient  denies   Unilateral weakness?  Patient denies   Any tremors?  Patient denies   Any history of anosmia?  Patient denies   Any incontinence of urine?  Incontinence wears pads. Frequent UTIs Any bowel dysfunction?   Patient denies    History of heavy alcohol intake?  Patient denies   History of heavy tobacco use?  Patient denies   Family history of dementia?   Maternal Grandmother, maternal uncle and maternal niece  with Alzheimer's disease   Retired Environmental health practitioner  PREVIOUS MEDICATIONS:   CURRENT MEDICATIONS:  Outpatient Encounter Medications as of 07/10/2023  Medication Sig   acetaminophen  (TYLENOL ) 500 MG tablet Take 1,000 mg by mouth 2 (two) times daily.   anastrozole  (ARIMIDEX ) 1 MG tablet TAKE 1 TABLET(1 MG) BY MOUTH DAILY   [DISCONTINUED] memantine  (NAMENDA ) 5 MG tablet Take 1 tablet twice a day   estradiol  (ESTRACE   VAGINAL) 0.1 MG/GM vaginal cream Place 1 g vaginally 2 (two) times a week. (Patient not taking: Reported on 07/10/2023)   gabapentin  (NEURONTIN ) 300 MG capsule Take a 300 mg capsule three times a day for two weeks following surgery.Then take a 300 mg capsule two times a day for two weeks. Then take a 300 mg capsule once a day for two weeks. Then discontinue.   memantine  (NAMENDA ) 10 MG tablet Take 1 tablet twice a day   miconazole  (MICATIN) 2 % cream Apply 1 Application topically 2 (two) times daily. (Patient not taking: Reported on 07/10/2023)   nitrofurantoin  (MACRODANTIN ) 50 MG capsule Take 1 capsule (50 mg total) by mouth at bedtime. (Patient not taking: Reported on 07/10/2023)   No facility-administered encounter medications on file as of 07/10/2023.       07/10/2023    5:00 PM 01/25/2023   11:00 AM 07/25/2022   12:00 PM  MMSE - Mini Mental State Exam  Orientation to time 3 5 4   Orientation to Place 5 5 5   Registration 3 3 3   Attention/ Calculation 5 5 5   Recall 2 3 2   Language- name 2 objects 2 2 2   Language- repeat 1 1 1   Language- follow 3 step command 3 3 3   Language- read & follow direction 1 1 1   Write a sentence 1 1 1   Copy design 0 1 1  Total score 26 30 28       07/24/2021    3:00 PM  Montreal Cognitive Assessment   Visuospatial/ Executive (0/5) 0  Naming (0/3) 3  Attention: Read list of digits (0/2) 2  Attention: Read list of letters (0/1) 1  Attention: Serial 7 subtraction starting at 100 (0/3) 2  Language: Repeat phrase (0/2) 2  Language : Fluency (0/1) 0  Abstraction (0/2) 2  Delayed Recall (0/5) 0  Orientation (0/6) 5  Total 17  Adjusted Score (based on education) 17    Objective:     PHYSICAL EXAMINATION:    VITALS:   Vitals:   07/10/23 1518  BP: 134/83  Pulse: 84  SpO2: 98%  Weight: 159 lb (72.1 kg)    GEN:  The patient appears stated age and is in NAD. HEENT:  Normocephalic, atraumatic.   Neurological examination:  General: NAD, well-groomed,  appears stated age. Orientation: The patient is alert. Oriented to person, place and not to date Cranial nerves: There is good facial symmetry.The speech is fluent and clear. No aphasia or dysarthria. Fund of knowledge is appropriate. Recent and remote memory are impaired. Attention and concentration are reduced. Able to name objects  and repeat phrases.  Hearing is intact to conversational tone.  Sensation: Sensation is intact to light touch throughout Motor: Strength is at least antigravity x4. DTR's 2/4 in UE/LE     Movement examination: Tone: There is normal tone in the UE/LE Abnormal movements:  no tremor.  No myoclonus.  No asterixis.   Coordination:  There is no decremation with RAM's. Normal finger to nose  Gait and Station: The patient has no difficulty arising out of a deep-seated chair without the use of the hands. The patient's stride length is good.  Gait is cautious and narrow.  Flexes forward.   Thank you for allowing us  the opportunity to participate in the care of this nice patient. Please do not hesitate to contact us  for any questions or concerns.   Total time spent on today's visit was 34 minutes dedicated to this patient today, preparing to see patient, examining the patient, ordering tests and/or medications and counseling the patient, documenting clinical information in the EHR or other health record, independently interpreting results and communicating results to the patient/family, discussing treatment and goals, answering patient's questions and coordinating care.  Cc:  Tisovec, Charlie ORN, MD  Camie Sevin 07/10/2023 5:48 PM

## 2023-08-02 ENCOUNTER — Ambulatory Visit: Payer: Medicare Other | Admitting: Physician Assistant

## 2023-08-11 ENCOUNTER — Emergency Department (HOSPITAL_COMMUNITY)

## 2023-08-11 ENCOUNTER — Other Ambulatory Visit: Payer: Self-pay

## 2023-08-11 ENCOUNTER — Inpatient Hospital Stay (HOSPITAL_COMMUNITY)
Admission: EM | Admit: 2023-08-11 | Discharge: 2023-08-14 | DRG: 871 | Disposition: A | Discharge status: Home / Self Care | Attending: Internal Medicine | Admitting: Internal Medicine

## 2023-08-11 DIAGNOSIS — A4159 Other Gram-negative sepsis: Secondary | ICD-10-CM

## 2023-08-11 DIAGNOSIS — Z87440 Personal history of urinary (tract) infections: Secondary | ICD-10-CM | POA: Diagnosis not present

## 2023-08-11 DIAGNOSIS — A419 Sepsis, unspecified organism: Secondary | ICD-10-CM | POA: Diagnosis present

## 2023-08-11 DIAGNOSIS — R509 Fever, unspecified: Secondary | ICD-10-CM | POA: Diagnosis present

## 2023-08-11 DIAGNOSIS — M810 Age-related osteoporosis without current pathological fracture: Secondary | ICD-10-CM | POA: Diagnosis present

## 2023-08-11 DIAGNOSIS — A414 Sepsis due to anaerobes: Secondary | ICD-10-CM | POA: Diagnosis not present

## 2023-08-11 DIAGNOSIS — Z8249 Family history of ischemic heart disease and other diseases of the circulatory system: Secondary | ICD-10-CM | POA: Diagnosis not present

## 2023-08-11 DIAGNOSIS — Z79811 Long term (current) use of aromatase inhibitors: Secondary | ICD-10-CM | POA: Diagnosis not present

## 2023-08-11 DIAGNOSIS — Z79899 Other long term (current) drug therapy: Secondary | ICD-10-CM | POA: Diagnosis not present

## 2023-08-11 DIAGNOSIS — F03A2 Unspecified dementia, mild, with psychotic disturbance: Secondary | ICD-10-CM | POA: Diagnosis present

## 2023-08-11 DIAGNOSIS — N3001 Acute cystitis with hematuria: Secondary | ICD-10-CM | POA: Diagnosis present

## 2023-08-11 DIAGNOSIS — G9341 Metabolic encephalopathy: Secondary | ICD-10-CM | POA: Diagnosis present

## 2023-08-11 DIAGNOSIS — E039 Hypothyroidism, unspecified: Secondary | ICD-10-CM | POA: Diagnosis present

## 2023-08-11 DIAGNOSIS — N3091 Cystitis, unspecified with hematuria: Secondary | ICD-10-CM | POA: Insufficient documentation

## 2023-08-11 DIAGNOSIS — E785 Hyperlipidemia, unspecified: Secondary | ICD-10-CM | POA: Diagnosis present

## 2023-08-11 DIAGNOSIS — R4182 Altered mental status, unspecified: Principal | ICD-10-CM

## 2023-08-11 DIAGNOSIS — N10 Acute tubulo-interstitial nephritis: Secondary | ICD-10-CM | POA: Diagnosis not present

## 2023-08-11 DIAGNOSIS — Z170 Estrogen receptor positive status [ER+]: Secondary | ICD-10-CM | POA: Diagnosis not present

## 2023-08-11 DIAGNOSIS — J150 Pneumonia due to Klebsiella pneumoniae: Secondary | ICD-10-CM | POA: Diagnosis present

## 2023-08-11 DIAGNOSIS — C50912 Malignant neoplasm of unspecified site of left female breast: Secondary | ICD-10-CM | POA: Diagnosis present

## 2023-08-11 DIAGNOSIS — E559 Vitamin D deficiency, unspecified: Secondary | ICD-10-CM | POA: Diagnosis present

## 2023-08-11 DIAGNOSIS — G3184 Mild cognitive impairment, so stated: Secondary | ICD-10-CM | POA: Insufficient documentation

## 2023-08-11 DIAGNOSIS — Z96652 Presence of left artificial knee joint: Secondary | ICD-10-CM | POA: Diagnosis present

## 2023-08-11 LAB — CBC WITH DIFFERENTIAL/PLATELET
Abs Immature Granulocytes: 0.05 K/uL (ref 0.00–0.07)
Basophils Absolute: 0 K/uL (ref 0.0–0.1)
Basophils Relative: 0 %
Eosinophils Absolute: 0 K/uL (ref 0.0–0.5)
Eosinophils Relative: 0 %
HCT: 30.6 % — ABNORMAL LOW (ref 36.0–46.0)
Hemoglobin: 9 g/dL — ABNORMAL LOW (ref 12.0–15.0)
Immature Granulocytes: 1 %
Lymphocytes Relative: 5 %
Lymphs Abs: 0.4 K/uL — ABNORMAL LOW (ref 0.7–4.0)
MCH: 24.7 pg — ABNORMAL LOW (ref 26.0–34.0)
MCHC: 29.4 g/dL — ABNORMAL LOW (ref 30.0–36.0)
MCV: 83.8 fL (ref 80.0–100.0)
Monocytes Absolute: 0.7 K/uL (ref 0.1–1.0)
Monocytes Relative: 8 %
Neutro Abs: 7.9 K/uL — ABNORMAL HIGH (ref 1.7–7.7)
Neutrophils Relative %: 86 %
Platelets: 247 K/uL (ref 150–400)
RBC: 3.65 MIL/uL — ABNORMAL LOW (ref 3.87–5.11)
RDW: 15.7 % — ABNORMAL HIGH (ref 11.5–15.5)
WBC: 9.1 K/uL (ref 4.0–10.5)
nRBC: 0 % (ref 0.0–0.2)

## 2023-08-11 LAB — URINALYSIS, W/ REFLEX TO CULTURE (INFECTION SUSPECTED)
Bilirubin Urine: NEGATIVE
Glucose, UA: NEGATIVE mg/dL
Ketones, ur: NEGATIVE mg/dL
Nitrite: NEGATIVE
Protein, ur: 100 mg/dL — AB
RBC / HPF: >50 RBC/hpf (ref 0–5)
Specific Gravity, Urine: 1.014 (ref 1.005–1.030)
WBC, UA: >50 WBC/hpf (ref 0–5)
pH: 6 (ref 5.0–8.0)

## 2023-08-11 LAB — COMPREHENSIVE METABOLIC PANEL WITH GFR
ALT: 10 U/L (ref 0–44)
AST: 16 U/L (ref 15–41)
Albumin: 3.2 g/dL — ABNORMAL LOW (ref 3.5–5.0)
Alkaline Phosphatase: 72 U/L (ref 38–126)
Anion gap: 11 (ref 5–15)
BUN: 16 mg/dL (ref 8–23)
CO2: 21 mmol/L — ABNORMAL LOW (ref 22–32)
Calcium: 9 mg/dL (ref 8.9–10.3)
Chloride: 106 mmol/L (ref 98–111)
Creatinine, Ser: 0.95 mg/dL (ref 0.44–1.00)
GFR, Estimated: >60 mL/min (ref >60)
Glucose, Bld: 125 mg/dL — ABNORMAL HIGH (ref 70–99)
Potassium: 4.4 mmol/L (ref 3.5–5.1)
Sodium: 138 mmol/L (ref 135–145)
Total Bilirubin: 0.3 mg/dL (ref 0.0–1.2)
Total Protein: 6.5 g/dL (ref 6.5–8.1)

## 2023-08-11 LAB — LACTIC ACID, PLASMA
Lactic Acid, Venous: 1.6 mmol/L (ref 0.5–1.9)
Lactic Acid, Venous: 1.5 mmol/L (ref 0.5–1.9)

## 2023-08-11 LAB — PROTIME-INR
INR: 1 (ref 0.8–1.2)
Prothrombin Time: 13.9 s (ref 11.4–15.2)

## 2023-08-11 MED ORDER — SODIUM CHLORIDE 0.9 % IV SOLN: 2.0000 g | INTRAVENOUS | Status: DC

## 2023-08-11 MED ORDER — ENOXAPARIN SODIUM 40 MG/0.4ML IJ SOSY
40.0000 mg | PREFILLED_SYRINGE | Freq: Every day | INTRAMUSCULAR | Status: DC
  Administered 2023-08-11 – 2023-08-14 (×4): 40 mg via SUBCUTANEOUS
  Filled 2023-08-11 (×4): qty 0.4

## 2023-08-11 MED ORDER — ACETAMINOPHEN 500 MG PO TABS
1000.0000 mg | ORAL_TABLET | Freq: Three times a day (TID) | ORAL | Status: DC
  Administered 2023-08-11 – 2023-08-14 (×9): 1000 mg via ORAL
  Filled 2023-08-11 (×9): qty 2

## 2023-08-11 MED ORDER — IBUPROFEN 400 MG PO TABS
400.0000 mg | ORAL_TABLET | Freq: Once | ORAL | Status: AC
  Administered 2023-08-11: 400 mg via ORAL
  Filled 2023-08-11: qty 1

## 2023-08-11 MED ORDER — SODIUM CHLORIDE 0.9 % IV SOLN
2.0000 g | Freq: Once | INTRAVENOUS | Status: AC
  Administered 2023-08-11: 2 g via INTRAVENOUS
  Filled 2023-08-11: qty 20

## 2023-08-11 MED ORDER — VANCOMYCIN HCL IN DEXTROSE 1-5 GM/200ML-% IV SOLN
1000.0000 mg | INTRAVENOUS | Status: DC
  Filled 2023-08-11: qty 200

## 2023-08-11 MED ORDER — MEMANTINE HCL 10 MG PO TABS
10.0000 mg | ORAL_TABLET | Freq: Two times a day (BID) | ORAL | Status: DC
  Administered 2023-08-11 – 2023-08-14 (×7): 10 mg via ORAL
  Filled 2023-08-11 (×7): qty 1

## 2023-08-11 MED ORDER — LACTATED RINGERS IV SOLN: INTRAVENOUS | Status: DC

## 2023-08-11 MED ORDER — ANASTROZOLE 1 MG PO TABS
1.0000 mg | ORAL_TABLET | Freq: Every day | ORAL | Status: DC
  Administered 2023-08-12 – 2023-08-14 (×3): 1 mg via ORAL
  Filled 2023-08-11 (×5): qty 1

## 2023-08-11 MED ORDER — SODIUM CHLORIDE 0.9 % IV SOLN
1000.0000 mg | Freq: Two times a day (BID) | INTRAVENOUS | Status: DC
  Administered 2023-08-11: 1000 mg via INTRAVENOUS
  Filled 2023-08-11 (×2): qty 20

## 2023-08-11 MED ORDER — PROCHLORPERAZINE EDISYLATE 10 MG/2ML IJ SOLN
5.0000 mg | INTRAMUSCULAR | Status: DC | PRN
  Administered 2023-08-11: 5 mg via INTRAVENOUS
  Filled 2023-08-11: qty 2

## 2023-08-11 MED ORDER — LACTATED RINGERS IV BOLUS
1000.0000 mL | Freq: Once | INTRAVENOUS | Status: AC
  Administered 2023-08-11: 1000 mL via INTRAVENOUS

## 2023-08-11 MED ORDER — ACETAMINOPHEN 325 MG PO TABS
650.0000 mg | ORAL_TABLET | Freq: Four times a day (QID) | ORAL | Status: DC | PRN
  Administered 2023-08-11: 650 mg via ORAL
  Filled 2023-08-11: qty 2

## 2023-08-11 MED ORDER — ACETAMINOPHEN 650 MG RE SUPP
650.0000 mg | Freq: Four times a day (QID) | RECTAL | Status: DC | PRN
  Administered 2023-08-12: 650 mg via RECTAL
  Filled 2023-08-11: qty 1

## 2023-08-11 MED ORDER — VANCOMYCIN HCL 1750 MG/350ML IV SOLN
1750.0000 mg | Freq: Once | INTRAVENOUS | Status: AC
  Administered 2023-08-11: 1750 mg via INTRAVENOUS
  Filled 2023-08-11 (×2): qty 350

## 2023-08-11 MED ORDER — ACETAMINOPHEN 325 MG PO TABS
650.0000 mg | ORAL_TABLET | Freq: Once | ORAL | Status: AC
  Administered 2023-08-11: 650 mg via ORAL
  Filled 2023-08-11: qty 2

## 2023-08-11 MED ORDER — LACTATED RINGERS IV SOLN: INTRAVENOUS | Status: AC

## 2023-08-11 NOTE — Progress Notes (Signed)
   08/11/23 1451  Assess: MEWS Score  Temp (!) 102.2 F (39 C)  BP (!) 157/76  MAP (mmHg) 99  Pulse Rate (!) 106  SpO2 98 %  O2 Device Room Air  Assess: MEWS Score  MEWS Temp 2  MEWS Systolic 0  MEWS Pulse 1  MEWS RR 1  MEWS LOC 0  MEWS Score 4  MEWS Score Color Red  Assess: if the MEWS score is Yellow or Red  Were vital signs accurate and taken at a resting state? Yes  Does the patient meet 2 or more of the SIRS criteria? Yes  Does the patient have a confirmed or suspected source of infection? Yes  MEWS guidelines implemented  No, previously yellow, continue vital signs every 4 hours  Notify: Charge Nurse/RN  Name of Charge Nurse/RN Notified Rayfield Ned, RN  Provider Notification  Provider Name/Title Dr. EVONNIE  Date Provider Notified 08/11/23  Time Provider Notified 1537  Method of Notification Page  Notification Reason Change in status (temp, BP and HR)  Provider response Other (Comment)  Date of Provider Response 08/11/23  Time of Provider Response 1538  Assess: SIRS CRITERIA  SIRS Temperature  1  SIRS Respirations  1  SIRS Pulse 1  SIRS WBC 0  SIRS Score Sum  3

## 2023-08-11 NOTE — Progress Notes (Signed)
 Responded to nursing call:  MEWS 5, fever 103.1   Subjective: Pt is pleasant and awake.. complains of suprapubic pain.  Denies f/c, cp, sob, cough, headache, neck pain  Vitals:   08/11/23 1325 08/11/23 1451 08/11/23 1617 08/11/23 1716  BP: (!) 167/65 (!) 157/76 (!) 158/72 (!) 161/97  Pulse: (!) 108 (!) 106 (!) 101 (!) 111  Resp: (!) 24     Temp: (!) 100.8 F (38.2 C) (!) 102.2 F (39 C) 99.9 F (37.7 C) (!) 103.1 F (39.5 C)  TempSrc: Oral Oral Oral Oral  SpO2: 97% 98% 98% 95%  Weight:      Height:       CV--RRR Lung--CTA Abd--soft+BS/suprapubic pain -Ext--no CCE, no rash, no synovitis   Assessment/Plan:  SIRS -broaden abx with vanc and merrem  pending culture as pt continues to have recurrent fever -ibuprofen  x1 -continue IVF -CT abd/pelvis if fever does not trend down with new abx -granddaughter updated bedside    Alm Morton Simson, DO [facility]

## 2023-08-11 NOTE — H&P (Signed)
 History and Physical    Patient: Kim Krueger FMW:[id number] DOB: Jun 02, 1940 DOA: 08/11/2023 DOS: the patient was seen and examined on 08/11/2023 PCP: Vernadine Charlie ORN, MD  Patient coming from: Home  Chief Complaint:  Chief Complaint  Patient presents with   Fever   Altered Mental Status   HPI: Kim Krueger is a 83 year old female with a history of mild neurocognitive disorder, hypothyroidism, temporal arteritis, left breast cancer, overactive bladder with frequent UTIs, vitamin D deficiency, and hyperlipidemia presenting with confusion and altered mental status.  The patient was in her usual state of health when she went to bed on the evening of 08/10/2023.  She woke up at 4 AM on 08/11/2023 confused and hallucinating.  She has generalized weakness to the point where she was unable to get out of bed without assistance.  At baseline, the patient is able to ambulate with a cane and perform her own ADLs.  The patient also felt hot.  Her spouse took her temperature and it was 20 F.  EMS was activated.  The patient has not been start any new medications.  There is not been any recent fevers, headache, chest pain, shortness breath, cough, hemoptysis, nausea, vomiting, diarrhea, abdominal pain.  Notably, the patient has been complaining some dysuria and suprapubic pain in the early morning 08/11/2023.  There is no hematuria, hematochezia, melena.  She has been taking nitrofurantoin  prophylaxis for UTIs for the last 3 months. In the ED, the patient febrile up to 1 and 3.4 F.  She was tachycardic 110s and hemodynamically stable.  Oxygen saturation was 95% on room air.  WBC 9.1, hemoglobin 9.0, platelet 247.  Sodium 138, potassium 4.4, bicarbonate 21, serum creatinine 0.95.  LFTs were unremarkable.  Lactic acid 1.6>> 1.5.  UA showed >50 WBC, >50 RBC.  Chest x-ray was negative for any consolidations.  EKG showed sinus tachycardia with nonspecific ST changes.  Blood cultures and urine cultures were  obtained.  The patient was started on ceftriaxone .  She was admitted for further evaluation and treatment of sepsis.  Review of Systems: As mentioned in the history of present illness. All other systems reviewed and are negative. 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  The Hereditary Gene Panel offered by [facility] includes sequencing and/or deletion duplication testing of the following 47 genes: APC, ATM, AXIN2, BARD1, BMPR1A, BRCA1, BRCA2, BRIP1, CDH1, CDK4, CDKN2A (p14ARF), CDKN2A (p16INK4a), CHEK2, CTNNA1, DICER1, EPCAM (Deletion/duplication testing only), GREM1 (promoter region deletion/duplication   History of uterine cancer 03/14/2018   s/p hysterectomy   Hypokalemia 07/14/2021   Hypothyroidism    Impingement syndrome of right shoulder region 03/13/2021   Malignant neoplasm of upper-inner quadrant of left breast in female, estrogen receptor positive 02/12/2018   Metatarsalgia of right foot 03/13/2021   OAB (overactive bladder) 12/13/2021   Osteoarthritis of left knee 03/16/2018   Osteoporosis 12/28/2021   Pain in joint of right shoulder 03/13/2021   Pain in left knee 12/13/2017   Prolonged QT interval 07/14/2021   Sepsis due to undetermined organism 07/14/2021   Temporal arteritis    Urge incontinence    UTI (urinary tract infection) 07/14/2021   Past Surgical History:  Procedure Laterality Date   ABDOMINAL HYSTERECTOMY  ovaries removed as well   APPENDECTOMY     appendix ruptured as child, still have part of appendix   ARTERY BIOPSY Right 10/19/2015   Procedure: RIGHT TEMPORAL ARTERY BIOPSY;  Surgeon: Lynwood Pina, MD;  Location: [facility];  Service: General;  Laterality: Right;    BREAST BIOPSY Left 12/2017   INVASIVE DUCTAL CARCINOMA, LOCALIZED, GRADE 1 TO 2.   BREAST BIOPSY Left 03/13/2023   MM LT BREAST BX W LOC DEV 1ST LESION IMAGE BX SPEC STEREO GUIDE 03/13/2023 [facility]   BREAST LUMPECTOMY Left 02/28/2018   BREAST LUMPECTOMY WITH RADIOACTIVE SEED LOCALIZATION Left 02/28/2018   Procedure: LEFT BREAST LUMPECTOMY WITH RADIOACTIVE SEED LOCALIZATION;  Surgeon: Mikell Katz, MD;  Location: [facility];  Service: General;  Laterality: Left;   HERNIA REPAIR  03/2016   umbilical   INSERTION OF MESH N/A 03/28/2016   Procedure: INSERTION OF MESH;  Surgeon: Lynwood Pina, MD;  Location: [facility];  Service: General;  Laterality: N/A;   TOTAL KNEE ARTHROPLASTY Left 11/21/2022   Procedure: TOTAL KNEE ARTHROPLASTY;  Surgeon: Melodi Lerner, MD;  Location: [facility];  Service: Orthopedics;  Laterality: Left;   UMBILICAL HERNIA REPAIR N/A 03/28/2016   Procedure: LAPAROSCOPIC UMBILICAL HERNIA;  Surgeon: Lynwood Pina, MD;  Location: [facility];  Service: General;  Laterality: N/A;   Social History:  reports that she has never smoked. She has never used smokeless tobacco. She reports that she does not drink alcohol and does not use drugs.  Allergies  Allergen Reactions   Ceftin [Cefuroxime Axetil] Nausea Only    Caused severe yeast infection Tolerated Cephalosporin Date: 11/22/22.     Hydrocodone      Cause pain to worsen    Solifenacin     Altered mental state   Sulfa Antibiotics Other (See Comments)    Unknown childhood reaction after surgery    Tramadol  Other (See Comments)    Cause pain to worsen    Penicillins Rash         Family History  Problem Relation Age of Onset   Colon cancer Mother 76       d. 64   Heart disease Father    Dementia Maternal Grandmother    Alzheimer's disease Maternal Grandmother    Dementia Maternal Uncle    Alzheimer's disease Maternal Uncle    Ovarian cancer Cousin        died at 66; pat first cousin   Alzheimer's  disease Cousin    Dementia Cousin 47       mat first cousin    Prior to Admission medications   Medication Sig Start Date End Date Taking? Authorizing Provider  acetaminophen  (TYLENOL ) 500 MG tablet Take 1,000 mg by mouth 2 (two) times daily.    [provider]  anastrozole  (ARIMIDEX ) 1 MG tablet TAKE 1 TABLET(1 MG) BY MOUTH DAILY 04/26/23   Odean Potts, MD  estradiol  (ESTRACE  VAGINAL) 0.1 MG/GM vaginal cream Place 1 g vaginally 2 (two) times a week. Patient not taking: Reported on 07/10/2023 03/30/23   Gudena, Vinay, MD  gabapentin  (NEURONTIN ) 300 MG capsule Take a 300 mg capsule three times a day for two weeks following surgery.Then take a 300 mg capsule two times a day for two weeks. Then take a 300 mg capsule once a day for two weeks. Then discontinue. 11/22/22   Edmisten, Roxie CROME, PA  memantine  (NAMENDA ) 10 MG tablet Take 1 tablet twice a day 07/10/23   Wertman, Sara E, PA-C  miconazole  (MICATIN) 2 % cream Apply 1 Application topically 2 (two) times daily. Patient not taking: Reported on 07/10/2023 03/30/23   Gudena, Vinay, MD  nitrofurantoin  (MACRODANTIN ) 50 MG capsule Take 1 capsule (50 mg total) by mouth at bedtime. Patient not taking: Reported on 07/10/2023 12/26/22   Sherrilee Belvie CROME, MD    Physical Exam: Vitals:   08/11/23 0900 08/11/23 0915 08/11/23 0930 08/11/23 0952  BP: (!) 154/93 (!) 149/62 (!) 142/73 (!) 149/58  Pulse:      Resp: 16 15 17    Temp:    99.9 F (37.7 C)  TempSrc:    Oral  SpO2:    96%  Weight:      Height:       GENERAL:  A&O x 3, NAD, well developed, cooperative, follows commands HEENT: [place]/AT, No thrush, No icterus, No oral ulcers Neck:  No neck mass, No meningismus, soft, supple CV: RRR, no S3, no S4, no rub, no JVD Lungs:  CTA, no wheeze, no rhonchi, good air movement Abd: soft/+suprapubic pain +BS, nondistended Ext: No edema, no lymphangitis, no cyanosis, no rashes Neuro:  CN II-XII intact, strength 4/5 in RUE, RLE, strength 4/5 LUE, LLE;  sensation intact bilateral; no dysmetria; babinski equivocal  Data Reviewed: Data reviewed above in history  Assessment and Plan: Sepsis -presented with fever, tachycardia and AMS -due to urinary source -lactic 1.6>>1.5 -continue IVF -continue ceftriaxone  pending cultures -follow urine and blood cultures  UTI -UA> 50 WBC -continue ceftriaxone  pending cultures   Hypothyroidism -continue synthroid   Minor Neurocognitive Disorder -continue namenda   Left Breast Cancer -continue anastrazole        Advance Care Planning: FULL  Consults: none  Family Communication: spouse and daughter 7/25  Severity of Illness: The appropriate patient status for this patient is INPATIENT. Inpatient status is judged to be reasonable and necessary in order to provide the required intensity of service to ensure the patient's safety. The patient's presenting symptoms, physical exam findings, and initial radiographic and laboratory data in the context of their chronic comorbidities is felt to place them at high risk for further clinical deterioration. Furthermore, it is not anticipated that the patient will be medically stable for discharge from the hospital within 2 midnights of admission.   * I certify that at the point of admission it is my clinical judgment that the patient will require inpatient hospital care spanning beyond 2 midnights from the point of admission due to high intensity of service, high risk for further deterioration and high frequency of surveillance required.*  Author: Alm Schneider, MD 08/11/2023 10:52 AM  For on call review [email or web address].

## 2023-08-11 NOTE — Plan of Care (Signed)
   Problem: Activity: Goal: Risk for activity intolerance will decrease Outcome: Progressing   Problem: Coping: Goal: Level of anxiety will decrease Outcome: Progressing

## 2023-08-11 NOTE — ED Provider Notes (Signed)
  Provider Note MRN:  [id number]  Arrival date & time: 08/11/23    ED Course and Medical Decision Making  Assumed care from Dr Haze at shift change.  See note from prior team for complete details, in brief:  Clinical Course as of 08/11/23 1002  Fri Aug 11, 2023  0711 Handoff CJP 83 yo/f Here from home  Dementia, fever, ams, abd pain Septic  [SG]  0932 Hemoglobin(!): 9.0 Similar to prior [SG]  0938 Source identified, call code sepsis, she is HDS, not in septic shock [SG]    Clinical Course User Index [SG] Elnor Savant A, DO   Encephalopathy likely secondary to sepsis, fever.  Mental status is improved after she defervesced.  Sepsis secondary to UTI.  HDS.  Not in septic shock.  Start Rocephin , fluids.  Admit to hospitalist   .Critical Care  Performed by: Elnor Savant LABOR, DO Authorized by: Elnor Savant LABOR, DO   Critical care provider statement:    Critical care time (minutes):  30   Critical care time was exclusive of:  Separately billable procedures and treating other patients   Critical care was necessary to treat or prevent imminent or life-threatening deterioration of the following conditions:  Sepsis   Critical care was time spent personally by me on the following activities:  Development of treatment plan with patient or surrogate, discussions with consultants, evaluation of patient's response to treatment, examination of patient, ordering and review of laboratory studies, ordering and review of radiographic studies, ordering and performing treatments and interventions, pulse oximetry, re-evaluation of patient's condition, review of old charts and obtaining history from patient or surrogate   Care discussed with: admitting provider     Final Clinical Impressions(s) / ED Diagnoses     ICD-10-CM   1. Altered mental status, unspecified altered mental status type  R41.82     2. Sepsis, due to unspecified organism, unspecified whether acute organ dysfunction present (HCC)   A41.9     3. Acute cystitis with hematuria  N30.01       ED Discharge Orders     None       Discharge Instructions   None        Elnor Savant LABOR, DO 08/11/23 1002

## 2023-08-11 NOTE — Progress Notes (Signed)
   08/11/23 1716  Assess: MEWS Score  Temp (!) 103.1 F (39.5 C)  BP (!) 161/97  MAP (mmHg) 104  Pulse Rate (!) 111  SpO2 95 %  O2 Device Room Air  Assess: MEWS Score  MEWS Temp 2  MEWS Systolic 0  MEWS Pulse 2  MEWS RR 1  MEWS LOC 0  MEWS Score 5  MEWS Score Color Red  Assess: if the MEWS score is Yellow or Red  Were vital signs accurate and taken at a resting state? Yes  Does the patient meet 2 or more of the SIRS criteria? Yes  Does the patient have a confirmed or suspected source of infection? Yes  MEWS guidelines implemented  No, previously red, continue vital signs every 4 hours  Notify: Charge Nurse/RN  Name of Charge Nurse/RN Notified Camelia Hock, RN  Provider Notification  Provider Name/Title Dr. Evonnie  Date Provider Notified 08/11/23  Time Provider Notified 1723  Method of Notification Page  Notification Reason Change in status  Provider response En route  Date of Provider Response 08/11/23  Time of Provider Response 1723  Assess: SIRS CRITERIA  SIRS Temperature  1  SIRS Respirations  1  SIRS Pulse 1  SIRS WBC 0  SIRS Score Sum  3

## 2023-08-11 NOTE — ED Triage Notes (Signed)
 BIB EMS from home after waking up more altered than usual. Husband reports to EMS that she has had some burning on urination. Patient c/o burning in lower abdomen. Hx of dementia.

## 2023-08-11 NOTE — Consult Note (Signed)
 Pharmacy Antibiotic Note  Kim Krueger is a 83 y.o. female admitted on 08/11/2023 with sepsis. Pharmacy has been consulted for vancomycin  dosing.  Today, 08/11/2023:  Day 1 of vancomycin   WBC 9.1  Tmax 103.1 SCr 0.95 today with estimated CrCl of 45.3 mL/min  Renal function appears to be at baseline  7/25 blood cultures with no growth to date 7/25 urine culture is pending   Plan:  Give a full loading dose of vancomycin  1750 mg IV x 1 dose, followed by vancomycin  1000 mg IV Q24H Goal AUC 400-550 Estimated AUC 515.2 Estimated Cmin 13.9 SCr used 0.95  IBW used, Vd 0.72 (BMI 28.59)  Pharmacy will continue to monitor and dose adjust appropriately    Height: 5' 3 (160 cm) Weight: 73.2 kg (161 lb 6 oz) IBW/kg (Calculated) : 52.4  Temp (24hrs), Avg:101.6 F (38.7 C), Min:99.9 F (37.7 C), Max:103.4 F (39.7 C)  Recent Labs  Lab 08/11/23 0700 08/11/23 0854  WBC 9.1  --   CREATININE 0.95  --   LATICACIDVEN 1.6 1.5    Estimated Creatinine Clearance: 45.3 mL/min (by C-G formula based on SCr of 0.95 mg/dL).    Allergies  Allergen Reactions   Ceftin [Cefuroxime Axetil] Nausea Only    Caused severe yeast infection Tolerated Cephalosporin Date: 11/22/22.     Hydrocodone      Cause pain to worsen    Solifenacin     Altered mental state   Sulfa Antibiotics Other (See Comments)    Unknown childhood reaction after surgery    Tramadol  Other (See Comments)    Cause pain to worsen    Penicillins Rash        Antimicrobials this admission: 7/25 ceftriaxone  x 1 dose 7/25 meropenem  >>  7/25 vancomycin  >>   Dose adjustments this admission:  Microbiology results: 7/25 BCx: NGTD 7/25 UCx: pending   Thank you for allowing pharmacy to be a part of this patient's care.  Joyanne Eddinger, PharmD Clinical Pharmacist  08/11/2023 5:35 PM

## 2023-08-11 NOTE — Progress Notes (Signed)
 PHARMACY NOTE:  ANTIMICROBIAL RENAL DOSAGE ADJUSTMENT  Current antimicrobial regimen includes a mismatch between antimicrobial dosage and estimated renal function.  As per policy approved by the Pharmacy & Therapeutics and Medical Executive Committees, the antimicrobial dosage will be adjusted accordingly.  Current antimicrobial dosage: Meropenem  500 mg IV q8h  Indication: Sepsis  Renal Function:  Estimated Creatinine Clearance: 45.3 mL/min (by C-G formula based on SCr of 0.95 mg/dL).    Antimicrobial dosage has been changed to:  Meropenem  1 g IV q12h  Thank you for allowing pharmacy to be a part of this patient's care.  Marolyn KATHEE Mare, [facility] 08/11/2023 5:33 PM

## 2023-08-11 NOTE — Sepsis Progress Note (Signed)
Sepsis protocol monitored by elink

## 2023-08-11 NOTE — ED Provider Notes (Addendum)
 [name] Provider Note   CSN: [id number] Arrival date & time: 08/11/23  9366     Patient presents with: Fever and Altered Mental Status   Kim Krueger is a 83 y.o. female.   Sent to the emergency department by husband by ambulance.  Patient recently diagnosed with mild dementia recently.  Patient does have a history of recurrent urinary tract infections in the past.  She is reportedly more confused than usual and has been complaining of burning with urination.       Prior to Admission medications   Medication Sig Start Date End Date Taking? Authorizing Provider  acetaminophen  (TYLENOL ) 500 MG tablet Take 1,000 mg by mouth 2 (two) times daily.    [provider]  anastrozole  (ARIMIDEX ) 1 MG tablet TAKE 1 TABLET(1 MG) BY MOUTH DAILY 04/26/23   Odean Potts, MD  estradiol  (ESTRACE  VAGINAL) 0.1 MG/GM vaginal cream Place 1 g vaginally 2 (two) times a week. Patient not taking: Reported on 07/10/2023 03/30/23   Gudena, Vinay, MD  gabapentin  (NEURONTIN ) 300 MG capsule Take a 300 mg capsule three times a day for two weeks following surgery.Then take a 300 mg capsule two times a day for two weeks. Then take a 300 mg capsule once a day for two weeks. Then discontinue. 11/22/22   Edmisten, Roxie CROME, PA  memantine  (NAMENDA ) 10 MG tablet Take 1 tablet twice a day 07/10/23   Wertman, Sara E, PA-C  miconazole  (MICATIN) 2 % cream Apply 1 Application topically 2 (two) times daily. Patient not taking: Reported on 07/10/2023 03/30/23   Gudena, Vinay, MD  nitrofurantoin  (MACRODANTIN ) 50 MG capsule Take 1 capsule (50 mg total) by mouth at bedtime. Patient not taking: Reported on 07/10/2023 12/26/22   Sherrilee Belvie CROME, MD    Allergies: Ceftin [cefuroxime axetil], Hydrocodone , Solifenacin, Sulfa antibiotics, Tramadol , and Penicillins    Review of Systems  Updated Vital Signs BP (!) 180/72 (BP Location: Right Arm)   Pulse (!) 118   Temp (!) 103.4  F (39.7 C) (Oral)   Resp 20   Ht 5' 3.5 (1.613 m)   Wt 72 kg   SpO2 94%   BMI 27.68 kg/m   Physical Exam Vitals and nursing note reviewed.  Constitutional:      General: She is not in acute distress.    Appearance: She is well-developed.  HENT:     Head: Normocephalic and atraumatic.     Mouth/Throat:     Mouth: Mucous membranes are moist.  Eyes:     General: Vision grossly intact. Gaze aligned appropriately.     Extraocular Movements: Extraocular movements intact.     Conjunctiva/sclera: Conjunctivae normal.  Cardiovascular:     Rate and Rhythm: Regular rhythm. Tachycardia present.     Pulses: Normal pulses.     Heart sounds: Normal heart sounds, S1 normal and S2 normal. No murmur heard.    No friction rub. No gallop.  Pulmonary:     Effort: Pulmonary effort is normal. No respiratory distress.     Breath sounds: Normal breath sounds.  Abdominal:     General: Bowel sounds are normal.     Palpations: Abdomen is soft.     Tenderness: There is no abdominal tenderness. There is no guarding or rebound.     Hernia: No hernia is present.  Musculoskeletal:        General: No swelling.     Cervical back: Full passive range of motion without pain,  normal range of motion and neck supple. No spinous process tenderness or muscular tenderness. Normal range of motion.     Right lower leg: No edema.     Left lower leg: No edema.  Skin:    General: Skin is warm and dry.     Capillary Refill: Capillary refill takes less than 2 seconds.     Findings: No ecchymosis, erythema, rash or wound.  Neurological:     General: No focal deficit present.     Mental Status: She is confused.     Cranial Nerves: Cranial nerves 2-12 are intact.     Sensory: Sensation is intact.     Motor: Motor function is intact.     Coordination: Coordination is intact.  Psychiatric:        Attention and Perception: Attention normal.        Mood and Affect: Mood normal.        Speech: Speech normal.         Behavior: Behavior normal.     (all labs ordered are listed, but only abnormal results are displayed) Labs Reviewed  CULTURE, BLOOD (ROUTINE X 2)  CULTURE, BLOOD (ROUTINE X 2)  LACTIC ACID, PLASMA  LACTIC ACID, PLASMA  COMPREHENSIVE METABOLIC PANEL WITH GFR  CBC WITH DIFFERENTIAL/PLATELET  PROTIME-INR  URINALYSIS, W/ REFLEX TO CULTURE (INFECTION SUSPECTED)    EKG: EKG Interpretation Date/Time:  Friday August 11 2023 06:44:45 EDT Ventricular Rate:  115 PR Interval:  135 QRS Duration:  86 QT Interval:  285 QTC Calculation: 395 R Axis:   17  Text Interpretation: Sinus tachycardia Abnormal R-wave progression, early transition Nonspecific repol abnormality, diffuse leads Confirmed by Haze Lonni PARAS [id number]) on 08/11/2023 6:50:08 AM  Radiology: No results found.   Procedures   Medications Ordered in the ED  cefTRIAXone  (ROCEPHIN ) 2 g in sodium chloride  0.9 % 100 mL IVPB (has no administration in time range)  lactated ringers  bolus 1,000 mL (has no administration in time range)                                    Medical Decision Making Amount and/or Complexity of Data Reviewed Labs: ordered. Radiology: ordered.   Patient reportedly with prehospital fever and increased confusion.  She does have mild tachycardia at arrival, no hypotension or sign of shock.  She is awake and alert.  Will perform workup for possible sepsis.  Will signout to oncoming ER physician to follow-up on results.     Final diagnoses:  Altered mental status, unspecified altered mental status type    ED Discharge Orders     None          Omkar Stratmann, Lonni PARAS, MD 08/11/23 9355    Haze Lonni PARAS, MD 08/11/23 [phone]

## 2023-08-11 NOTE — Hospital Course (Addendum)
 83 year old female with a history of mild neurocognitive disorder, hypothyroidism, temporal arteritis, left breast cancer, overactive bladder with frequent UTIs, vitamin D deficiency, and hyperlipidemia presenting with confusion and altered mental status.  The patient was in her usual state of health when she went to bed on the evening of 08/10/2023.  She woke up at 4 AM on 08/11/2023 confused and hallucinating.  She has generalized weakness to the point where she was unable to get out of bed without assistance.  At baseline, the patient is able to ambulate with a cane and perform her own ADLs.  The patient also felt hot.  Her spouse took her temperature and it was 61 F.  EMS was activated.  The patient has not been start any new medications.  There is not been any recent fevers, headache, chest pain, shortness breath, cough, hemoptysis, nausea, vomiting, diarrhea, abdominal pain.  Notably, the patient has been complaining some dysuria and suprapubic pain in the early morning 08/11/2023.  There is no hematuria, hematochezia, melena.  She has been taking nitrofurantoin  prophylaxis for UTIs for the last 3 months. In the ED, the patient febrile up to 1 and 3.4 F.  She was tachycardic 110s and hemodynamically stable.  Oxygen saturation was 95% on room air.  WBC 9.1, hemoglobin 9.0, platelet 247.  Sodium 138, potassium 4.4, bicarbonate 21, serum creatinine 0.95.  LFTs were unremarkable.  Lactic acid 1.6>> 1.5.  UA showed >50 WBC, >50 RBC.  Chest x-ray was negative for any consolidations.  EKG showed sinus tachycardia with nonspecific ST changes.  Blood cultures and urine cultures were obtained.  The patient was started on ceftriaxone .  She was admitted for further evaluation and treatment of sepsis.  She continued to have fevers, but remained hemodynanically stable.  Her abx were initially broadened.  Once cultures returned IV abx were narrowed ti cefazolin .  Her mental status continued to improve slowly and she  tolerated her diet.

## 2023-08-11 NOTE — Progress Notes (Signed)
   08/11/23 1325  Assess: MEWS Score  Temp (!) 100.8 F (38.2 C)  BP (!) 167/65  MAP (mmHg) 95  Pulse Rate (!) 108  Resp (!) 24  SpO2 97 %  O2 Device Room Air  Assess: MEWS Score  MEWS Temp 1  MEWS Systolic 0  MEWS Pulse 1  MEWS RR 1  MEWS LOC 0  MEWS Score 3  MEWS Score Color Yellow  Assess: if the MEWS score is Yellow or Red  Were vital signs accurate and taken at a resting state? Yes  Does the patient meet 2 or more of the SIRS criteria? Yes  Does the patient have a confirmed or suspected source of infection? Yes  MEWS guidelines implemented  No, previously yellow, continue vital signs every 4 hours  Notify: Charge Nurse/RN  Name of Charge Nurse/RN Notified rayfield ned, RN  Provider Notification  Provider Name/Title dr tat  Date Provider Notified 08/11/23  Time Provider Notified 1339  Method of Notification  (secure chat)  Notification Reason Other (Comment)  Assess: SIRS CRITERIA  SIRS Temperature  0  SIRS Respirations  1  SIRS Pulse 1  SIRS WBC 0  SIRS Score Sum  2

## 2023-08-12 ENCOUNTER — Inpatient Hospital Stay (HOSPITAL_COMMUNITY)

## 2023-08-12 DIAGNOSIS — A419 Sepsis, unspecified organism: Secondary | ICD-10-CM | POA: Diagnosis not present

## 2023-08-12 DIAGNOSIS — G3184 Mild cognitive impairment, so stated: Secondary | ICD-10-CM | POA: Diagnosis not present

## 2023-08-12 DIAGNOSIS — N3091 Cystitis, unspecified with hematuria: Secondary | ICD-10-CM

## 2023-08-12 DIAGNOSIS — G9341 Metabolic encephalopathy: Secondary | ICD-10-CM | POA: Diagnosis not present

## 2023-08-12 LAB — BASIC METABOLIC PANEL WITH GFR
Anion gap: 11 (ref 5–15)
BUN: 15 mg/dL (ref 8–23)
CO2: 21 mmol/L — ABNORMAL LOW (ref 22–32)
Calcium: 8.5 mg/dL — ABNORMAL LOW (ref 8.9–10.3)
Chloride: 105 mmol/L (ref 98–111)
Creatinine, Ser: 0.82 mg/dL (ref 0.44–1.00)
GFR, Estimated: >60 mL/min (ref >60)
Glucose, Bld: 103 mg/dL — ABNORMAL HIGH (ref 70–99)
Potassium: 3.6 mmol/L (ref 3.5–5.1)
Sodium: 137 mmol/L (ref 135–145)

## 2023-08-12 LAB — CBC
HCT: 30.6 % — ABNORMAL LOW (ref 36.0–46.0)
Hemoglobin: 9.1 g/dL — ABNORMAL LOW (ref 12.0–15.0)
MCH: 25 pg — ABNORMAL LOW (ref 26.0–34.0)
MCHC: 29.7 g/dL — ABNORMAL LOW (ref 30.0–36.0)
MCV: 84.1 fL (ref 80.0–100.0)
Platelets: 226 K/uL (ref 150–400)
RBC: 3.64 MIL/uL — ABNORMAL LOW (ref 3.87–5.11)
RDW: 15.9 % — ABNORMAL HIGH (ref 11.5–15.5)
WBC: 12.2 K/uL — ABNORMAL HIGH (ref 4.0–10.5)
nRBC: 0 % (ref 0.0–0.2)

## 2023-08-12 MED ORDER — IOHEXOL 9 MG/ML PO SOLN
500.0000 mL | ORAL | Status: AC
  Administered 2023-08-12 (×2): 500 mL via ORAL

## 2023-08-12 MED ORDER — SODIUM CHLORIDE 0.9 % IV SOLN
1000.0000 mg | Freq: Three times a day (TID) | INTRAVENOUS | Status: DC
  Administered 2023-08-12 – 2023-08-13 (×4): 1000 mg via INTRAVENOUS
  Filled 2023-08-12 (×3): qty 20

## 2023-08-12 MED ORDER — IOHEXOL 300 MG/ML  SOLN
100.0000 mL | Freq: Once | INTRAMUSCULAR | Status: AC | PRN
  Administered 2023-08-12: 100 mL via INTRAVENOUS

## 2023-08-12 MED ORDER — IOHEXOL 9 MG/ML PO SOLN
ORAL | Status: AC
  Filled 2023-08-12: qty 1000

## 2023-08-12 MED ORDER — AMITRIPTYLINE HCL 10 MG PO TABS
10.0000 mg | ORAL_TABLET | Freq: Every day | ORAL | Status: DC
  Administered 2023-08-12 – 2023-08-13 (×2): 10 mg via ORAL
  Filled 2023-08-12 (×2): qty 1

## 2023-08-12 MED ORDER — LEVOTHYROXINE SODIUM 88 MCG PO TABS
88.0000 ug | ORAL_TABLET | Freq: Every day | ORAL | Status: DC
  Administered 2023-08-12 – 2023-08-14 (×3): 88 ug via ORAL
  Filled 2023-08-12 (×3): qty 1

## 2023-08-12 NOTE — Plan of Care (Signed)
   Problem: Activity: Goal: Risk for activity intolerance will decrease Outcome: Progressing   Problem: Coping: Goal: Level of anxiety will decrease Outcome: Progressing

## 2023-08-12 NOTE — Plan of Care (Signed)

## 2023-08-12 NOTE — Progress Notes (Signed)
   08/12/23 1611  TOC Brief Assessment  Insurance and Status Reviewed  Patient has primary care physician Yes  Home environment has been reviewed From home c/husband  Prior level of function: Independent  Prior/Current Home Services No current home services  Social Drivers of Health Review SDOH reviewed no interventions necessary  Readmission risk has been reviewed Yes  Transition of care needs no transition of care needs at this time   Transition of Care Department [facility]) has reviewed patient and no TOC needs have been identified at this time. We will continue to monitor patient advancement through interdisciplinary progression rounds. If new patient transition needs arise, please place a TOC consult.

## 2023-08-12 NOTE — Progress Notes (Addendum)
 PROGRESS NOTE  Kim Krueger FMW:[id number] DOB: March 26, 1940 DOA: 08/11/2023 PCP: Vernadine Charlie ORN, MD  Brief History:  83 year old female with a history of mild neurocognitive disorder, hypothyroidism, temporal arteritis, left breast cancer, overactive bladder with frequent UTIs, vitamin D deficiency, and hyperlipidemia presenting with confusion and altered mental status.  The patient was in her usual state of health when she went to bed on the evening of 08/10/2023.  She woke up at 4 AM on 08/11/2023 confused and hallucinating.  She has generalized weakness to the point where she was unable to get out of bed without assistance.  At baseline, the patient is able to ambulate with a cane and perform her own ADLs.  The patient also felt hot.  Her spouse took her temperature and it was 23 F.  EMS was activated.  The patient has not been start any new medications.  There is not been any recent fevers, headache, chest pain, shortness breath, cough, hemoptysis, nausea, vomiting, diarrhea, abdominal pain.  Notably, the patient has been complaining some dysuria and suprapubic pain in the early morning 08/11/2023.  There is no hematuria, hematochezia, melena.  She has been taking nitrofurantoin  prophylaxis for UTIs for the last 3 months. In the ED, the patient febrile up to 1 and 3.4 F.  She was tachycardic 110s and hemodynamically stable.  Oxygen saturation was 95% on room air.  WBC 9.1, hemoglobin 9.0, platelet 247.  Sodium 138, potassium 4.4, bicarbonate 21, serum creatinine 0.95.  LFTs were unremarkable.  Lactic acid 1.6>> 1.5.  UA showed >50 WBC, >50 RBC.  Chest x-ray was negative for any consolidations.  EKG showed sinus tachycardia with nonspecific ST changes.  Blood cultures and urine cultures were obtained.  The patient was started on ceftriaxone .  She was admitted for further evaluation and treatment of sepsis.   Assessment/Plan: Sepsis -presented with fever, tachycardia and AMS -due to  urinary source -lactic 1.6>>1.5 -continue IVF -broaden abx coverage to vanc/merrem  due to persistent fever -CT abd/pelvis -follow blood cultures--neg to date -follow urine culture--pending   UTI -UA> 50 WBC -continue vanc/merrem  pending cultures    Hypothyroidism -continue synthroid    Minor Neurocognitive Disorder -continue namenda    Left Breast Cancer -continue anastrazole -02/28/2018 left lumpectomy -follow up Dr. Odean            Family Communication:   Family at bedside 7/26  Consultants:  none  Code Status:  FULL  DVT Prophylaxis:  [place] Lovenox    Procedures: As Listed in Progress Note Above  Antibiotics: Vanc 7/24>> Merrem  7/24>>      Subjective: Patient denies fevers, chills, headache, chest pain, dyspnea, nausea, vomiting, diarrhea, abdominal pain, dysuria, hematuria, hematochezia, and melena.   Objective: Vitals:   08/11/23 2153 08/11/23 2335 08/12/23 0251 08/12/23 0602  BP:  (!) 113/47 133/70 (!) 167/72  Pulse:  75 79 96  Resp:  20 19 20   Temp: 99.6 F (37.6 C) 98.3 F (36.8 C) 98.4 F (36.9 C) (!) 102.5 F (39.2 C)  TempSrc: Oral Oral Oral Oral  SpO2:  97% 100% 97%  Weight:      Height:        Intake/Output Summary (Last 24 hours) at 08/12/2023 0828 Last data filed at 08/12/2023 0544 Gross per 24 hour  Intake 2175.14 ml  Output 550 ml  Net 1625.14 ml   Weight change: 1.2 kg Exam:  General:  Pt is alert, follows commands appropriately, not in acute distress  HEENT: No icterus, No thrush, No neck mass, [place]/AT Cardiovascular: RRR, S1/S2, no rubs, no gallops Respiratory: CTA bilaterally, no wheezing, no crackles, no rhonchi Abdomen: Soft/+BS, non tender, non distended, no guarding Extremities: No edema, No lymphangitis, No petechiae, No rashes, no synovitis   Data Reviewed: I have personally reviewed following labs and imaging studies Basic Metabolic Panel: Recent Labs  Lab 08/11/23 0700 08/12/23 0407  NA 138 137  K  4.4 3.6  CL 106 105  CO2 21* 21*  GLUCOSE 125* 103*  BUN 16 15  CREATININE 0.95 0.82  CALCIUM 9.0 8.5*   Liver Function Tests: Recent Labs  Lab 08/11/23 0700  AST 16  ALT 10  ALKPHOS 72  BILITOT 0.3  PROT 6.5  ALBUMIN 3.2*   No results for input(s): LIPASE, AMYLASE in the last 168 hours. No results for input(s): AMMONIA in the last 168 hours. Coagulation Profile: Recent Labs  Lab 08/11/23 0700  INR 1.0   CBC: Recent Labs  Lab 08/11/23 0700 08/12/23 0407  WBC 9.1 12.2*  NEUTROABS 7.9*  --   HGB 9.0* 9.1*  HCT 30.6* 30.6*  MCV 83.8 84.1  PLT 247 226   Cardiac Enzymes: No results for input(s): CKTOTAL, CKMB, CKMBINDEX, TROPONINI in the last 168 hours. BNP: Invalid input(s): POCBNP CBG: No results for input(s): GLUCAP in the last 168 hours. HbA1C: No results for input(s): HGBA1C in the last 72 hours. Urine analysis:    Component Value Date/Time   COLORURINE YELLOW 08/11/2023 0654   APPEARANCEUR HAZY (A) 08/11/2023 0654   APPEARANCEUR Cloudy (A) 03/20/2023 1509   LABSPEC 1.014 08/11/2023 0654   PHURINE 6.0 08/11/2023 0654   GLUCOSEU NEGATIVE 08/11/2023 0654   HGBUR LARGE (A) 08/11/2023 0654   BILIRUBINUR NEGATIVE 08/11/2023 0654   BILIRUBINUR Negative 03/20/2023 1509   KETONESUR NEGATIVE 08/11/2023 0654   PROTEINUR 100 (A) 08/11/2023 0654   NITRITE NEGATIVE 08/11/2023 0654   LEUKOCYTESUR LARGE (A) 08/11/2023 0654   Sepsis Labs: @LABRCNTIP (procalcitonin:4,lacticidven:4) ) Recent Results (from the past 240 hours)  Urine Culture     Status: None (Preliminary result)   Collection Time: 08/11/23  6:54 AM   Specimen: Urine, Catheterized  Result Value Ref Range Status   Specimen Description   Final    URINE, CATHETERIZED Performed at [facility], [address] [place], [place] [address]    Special Requests   Final    NONE Reflexed from [id number] Performed at [facility], [address] [place], [place] [address]    Culture  PENDING  Incomplete   Report Status PENDING  Incomplete  Blood Culture (routine x 2)     Status: None (Preliminary result)   Collection Time: 08/11/23  7:00 AM   Specimen: BLOOD  Result Value Ref Range Status   Specimen Description BLOOD BLOOD RIGHT ARM  Final   Special Requests   Final    BOTTLES DRAWN AEROBIC AND ANAEROBIC Blood Culture adequate volume   Culture   Final    NO GROWTH 1 DAY Performed at [facility], [address], [place], [place] [address]    Report Status PENDING  Incomplete  Blood Culture (routine x 2)     Status: None (Preliminary result)   Collection Time: 08/11/23  7:08 AM   Specimen: BLOOD  Result Value Ref Range Status   Specimen Description BLOOD BLOOD RIGHT ARM  Final   Special Requests   Final    BOTTLES DRAWN AEROBIC AND ANAEROBIC Blood Culture adequate volume  Culture   Final    NO GROWTH 1 DAY Performed at [facility], [address], [place], [place] [address]    Report Status PENDING  Incomplete     Scheduled Meds:  acetaminophen   1,000 mg Oral Q8H   anastrozole   1 mg Oral Daily   enoxaparin  (LOVENOX ) injection  40 mg Subcutaneous Daily   levothyroxine   88 mcg Oral QAC breakfast   memantine   10 mg Oral BID   Continuous Infusions:  lactated ringers  125 mL/hr at 08/12/23 0544   meropenem  (MERREM ) IV 1,000 mg (08/12/23 0823)   vancomycin       Procedures/Studies: DG Chest Port 1 View Result Date: 08/11/2023 CLINICAL DATA:  Dysuria.  Altered mental status. EXAM: PORTABLE CHEST 1 VIEW COMPARISON:  Chest radiograph dated 07/13/2021 FINDINGS: Normal lung volumes. Large hiatal hernia. No focal consolidations. No pleural effusion or pneumothorax. Similar mildly enlarged cardiomediastinal silhouette. No acute osseous abnormality. IMPRESSION: 1.  No focal consolidations. 2. Large hiatal hernia. Electronically Signed   By: Limin  Xu M.D.   On: 08/11/2023 07:16    Alm Shaine Mount, DO  [facility]  If 7PM-7AM, please contact  night-coverage [email or web address] Password [email or web address] 08/12/2023, 8:28 AM   LOS: 1 day

## 2023-08-13 DIAGNOSIS — N3001 Acute cystitis with hematuria: Secondary | ICD-10-CM | POA: Diagnosis not present

## 2023-08-13 DIAGNOSIS — A414 Sepsis due to anaerobes: Secondary | ICD-10-CM | POA: Diagnosis not present

## 2023-08-13 DIAGNOSIS — G3184 Mild cognitive impairment, so stated: Secondary | ICD-10-CM | POA: Diagnosis not present

## 2023-08-13 LAB — BASIC METABOLIC PANEL WITH GFR
Anion gap: 8 (ref 5–15)
BUN: 11 mg/dL (ref 8–23)
CO2: 22 mmol/L (ref 22–32)
Calcium: 7.9 mg/dL — ABNORMAL LOW (ref 8.9–10.3)
Chloride: 107 mmol/L (ref 98–111)
Creatinine, Ser: 0.72 mg/dL (ref 0.44–1.00)
GFR, Estimated: >60 mL/min (ref >60)
Glucose, Bld: 98 mg/dL (ref 70–99)
Potassium: 3.3 mmol/L — ABNORMAL LOW (ref 3.5–5.1)
Sodium: 137 mmol/L (ref 135–145)

## 2023-08-13 LAB — MAGNESIUM: Magnesium: 1.7 mg/dL (ref 1.7–2.4)

## 2023-08-13 LAB — CBC
HCT: 25.9 % — ABNORMAL LOW (ref 36.0–46.0)
Hemoglobin: 7.7 g/dL — ABNORMAL LOW (ref 12.0–15.0)
MCH: 24.4 pg — ABNORMAL LOW (ref 26.0–34.0)
MCHC: 29.7 g/dL — ABNORMAL LOW (ref 30.0–36.0)
MCV: 82 fL (ref 80.0–100.0)
Platelets: 201 K/uL (ref 150–400)
RBC: 3.16 MIL/uL — ABNORMAL LOW (ref 3.87–5.11)
RDW: 15.7 % — ABNORMAL HIGH (ref 11.5–15.5)
WBC: 9.1 K/uL (ref 4.0–10.5)
nRBC: 0 % (ref 0.0–0.2)

## 2023-08-13 LAB — URINE CULTURE: Culture: >=100000 — AB

## 2023-08-13 MED ORDER — MAGNESIUM SULFATE 2 GM/50ML IV SOLN
2.0000 g | Freq: Once | INTRAVENOUS | Status: AC
  Administered 2023-08-13: 2 g via INTRAVENOUS
  Filled 2023-08-13: qty 50

## 2023-08-13 MED ORDER — CEFAZOLIN SODIUM-DEXTROSE 2-4 GM/100ML-% IV SOLN
2.0000 g | Freq: Three times a day (TID) | INTRAVENOUS | Status: DC
  Administered 2023-08-13 – 2023-08-14 (×3): 2 g via INTRAVENOUS
  Filled 2023-08-13 (×3): qty 100

## 2023-08-13 MED ORDER — POTASSIUM CHLORIDE CRYS ER 20 MEQ PO TBCR
40.0000 meq | EXTENDED_RELEASE_TABLET | Freq: Once | ORAL | Status: AC
  Administered 2023-08-13: 40 meq via ORAL
  Filled 2023-08-13: qty 2

## 2023-08-13 NOTE — Progress Notes (Signed)
 Walked in hallway today with walker and standby assist.  Sat in chair for several hours.  Family has been at bedside for large part of day.

## 2023-08-13 NOTE — Discharge Summary (Signed)
 Physician Discharge Summary   Patient: Kim Krueger MRN: [id number] DOB: 05-04-1940  Admit date:     08/11/2023  Discharge date: 08/14/2023  Discharge Physician: Alm Lakesa Coste   PCP: Tisovec, Richard W, MD   Recommendations at discharge:   Please follow up with primary care provider within 1-2 weeks  Please repeat BMP and CBC in one week  Hospital Course: 83 year old female with a history of mild neurocognitive disorder, hypothyroidism, temporal arteritis, left breast cancer, overactive bladder with frequent UTIs, vitamin D deficiency, and hyperlipidemia presenting with confusion and altered mental status.  The patient was in her usual state of health when she went to bed on the evening of 08/10/2023.  She woke up at 4 AM on 08/11/2023 confused and hallucinating.  She has generalized weakness to the point where she was unable to get out of bed without assistance.  At baseline, the patient is able to ambulate with a cane and perform her own ADLs.  The patient also felt hot.  Her spouse took her temperature and it was 34 F.  EMS was activated.  The patient has not been start any new medications.  There is not been any recent fevers, headache, chest pain, shortness breath, cough, hemoptysis, nausea, vomiting, diarrhea, abdominal pain.  Notably, the patient has been complaining some dysuria and suprapubic pain in the early morning 08/11/2023.  There is no hematuria, hematochezia, melena.  She has been taking nitrofurantoin  prophylaxis for UTIs for the last 3 months. In the ED, the patient febrile up to 1 and 3.4 F.  She was tachycardic 110s and hemodynamically stable.  Oxygen saturation was 95% on room air.  WBC 9.1, hemoglobin 9.0, platelet 247.  Sodium 138, potassium 4.4, bicarbonate 21, serum creatinine 0.95.  LFTs were unremarkable.  Lactic acid 1.6>> 1.5.  UA showed >50 WBC, >50 RBC.  Chest x-ray was negative for any consolidations.  EKG showed sinus tachycardia with nonspecific ST changes.  Blood  cultures and urine cultures were obtained.  The patient was started on ceftriaxone .  She was admitted for further evaluation and treatment of sepsis.  She continued to have fevers, but remained hemodynanically stable.  Her abx were initially broadened.  Once cultures returned IV abx were narrowed ti cefazolin .  Her mental status continued to improve slowly and she tolerated her diet.    Assessment and Plan: Sepsis -presented with fever, tachycardia and AMS -due to urinary source -lactic 1.6>>1.5 -continue IVF>>saline locked once physiology improved -broaden abx coverage to vanc/merrem  due to persistent fever -7/26 CT abd/pelvis--Abnormal wall thickening in the right distal ureter and in the urinary bladder especially eccentric to the right in the urinary bladder. Patchy regions of hypoenhancement in the right kidney without rim sign, compatible with pyelonephritis -follow blood cultures--neg to date -follow urine culture--Klebsiella -sepsis physiology resolved   UTI--Klebsiella PNA -UA> 50 WBC -d/c vanc and merrem  -started cefazolin  - d/c home with cefadroxil  x 11 more days to complete 14 days total   Hypothyroidism -continue synthroid    Minor Neurocognitive Disorder -continue namenda    Left Breast Cancer -continue anastrazole -02/28/2018 left lumpectomy -follow up Dr. Odean         Consultants: none Procedures performed: home  Disposition: Home Diet recommendation:  Regular diet DISCHARGE MEDICATION: Allergies as of 08/14/2023       Reactions   Ceftin [cefuroxime Axetil] Nausea Only   Caused severe yeast infection Tolerated Cephalosporin Date: 11/22/22.   Hydrocodone     Cause pain to worsen    Solifenacin  Altered mental state   Sulfa Antibiotics Other (See Comments)   Unknown childhood reaction after surgery    Tramadol  Other (See Comments)   Cause pain to worsen    Penicillins Rash           Medication List     TAKE these medications     acetaminophen  500 MG tablet Commonly known as: TYLENOL  Take 1,000 mg by mouth 2 (two) times daily.   amitriptyline  10 MG tablet Commonly known as: ELAVIL  Take 10 mg by mouth at bedtime.   anastrozole  1 MG tablet Commonly known as: ARIMIDEX  TAKE 1 TABLET(1 MG) BY MOUTH DAILY   cefadroxil  500 MG capsule Commonly known as: DURICEF Take 2 capsules (1,000 mg total) by mouth 2 (two) times daily.   levothyroxine  75 MCG tablet Commonly known as: SYNTHROID  Take 75 mcg by mouth daily before breakfast.   memantine  10 MG tablet Commonly known as: NAMENDA  Take 1 tablet twice a day   nitrofurantoin  50 MG capsule Commonly known as: Macrodantin  Take 1 capsule (50 mg total) by mouth at bedtime.        Discharge Exam: Filed Weights   08/11/23 9357 08/11/23 1322  Weight: 72 kg 73.2 kg   HEENT:  [place]/AT, No thrush, no icterus CV:  RRR, no rub, no S3, no S4 Lung:  CTA, no wheeze, no rhonchi Abd:  soft/+BS, NT Ext:  No edema, no lymphangitis, no synovitis, no rash   Condition at discharge: stable  The results of significant diagnostics from this hospitalization (including imaging, microbiology, ancillary and laboratory) are listed below for reference.   Imaging Studies: CT ABDOMEN PELVIS W CONTRAST Result Date: 08/12/2023 CLINICAL DATA:  Sepsis. Suprapubic pain. Urinary tract infection. Fever and leukocytosis. Tachycardia. Altered mental status. EXAM: CT ABDOMEN AND PELVIS WITH CONTRAST TECHNIQUE: Multidetector CT imaging of the abdomen and pelvis was performed using the standard protocol following bolus administration of intravenous contrast. RADIATION DOSE REDUCTION: This exam was performed according to the departmental dose-optimization program which includes automated exposure control, adjustment of the mA and/or kV according to patient size and/or use of iterative reconstruction technique. CONTRAST:  OMNIPAQUE  IOHEXOL  300 MG/ML  SOLN COMPARISON:  03/09/2016 FINDINGS: Lower  chest: Large hiatal hernia containing most of the stomach. Small bilateral pleural effusions with passive atelectasis. Mild mitral valve calcification. Descending thoracic aortic atherosclerosis. Hepatobiliary: Unremarkable Pancreas: Unremarkable Spleen: Unremarkable Adrenals/Urinary Tract: Abnormal wall thickening in the right distal ureter and in the urinary bladder especially eccentric to the right in the urinary bladder. Patchy regions of hypoenhancement in the right kidney without rim sign, suspicious for pyelonephritis. Scarring in the left kidney upper pole. Stomach/Bowel: Most of the stomach is intrathoracic. Orally administered contrast medium extends through to the rectum. Distal colonic air-fluid levels raise the possibility of diarrheal process. There is very little in the way of colonic stool. Normal appendix. Vascular/Lymphatic: Atherosclerosis is present, including aortoiliac atherosclerotic disease. Reproductive: Uterus absent.  Adnexa unremarkable. Other: Small amount of free pelvic fluid eccentric to the right on image 67 series 2. Musculoskeletal: Lower thoracic spondylosis. 8 mm degenerative anterolisthesis at L4-5, similar to prior. Umbilical hernia contains adipose tissue. IMPRESSION: 1. Abnormal wall thickening in the right distal ureter and in the urinary bladder especially eccentric to the right in the urinary bladder. Patchy regions of hypoenhancement in the right kidney without rim sign, compatible with pyelonephritis. 2. Small amount of free pelvic fluid eccentric to the right. 3. Distal colonic air-fluid levels raise the possibility of diarrheal process. There  is very little in the way of colonic stool. 4. Large hiatal hernia containing most of the stomach. 5. Small bilateral pleural effusions with passive atelectasis. 6. Umbilical hernia contains adipose tissue. 7. 8 mm degenerative anterolisthesis at L4-5, similar to prior. 8.  Aortic Atherosclerosis (ICD10-I70.0). Electronically  Signed   By: Ryan Salvage M.D.   On: 08/12/2023 16:44   DG Chest Port 1 View Result Date: 08/11/2023 CLINICAL DATA:  Dysuria.  Altered mental status. EXAM: PORTABLE CHEST 1 VIEW COMPARISON:  Chest radiograph dated 07/13/2021 FINDINGS: Normal lung volumes. Large hiatal hernia. No focal consolidations. No pleural effusion or pneumothorax. Similar mildly enlarged cardiomediastinal silhouette. No acute osseous abnormality. IMPRESSION: 1.  No focal consolidations. 2. Large hiatal hernia. Electronically Signed   By: Limin  Xu M.D.   On: 08/11/2023 07:16    Microbiology: Results for orders placed or performed during the hospital encounter of 08/11/23  Urine Culture     Status: Abnormal   Collection Time: 08/11/23  6:54 AM   Specimen: Urine, Catheterized  Result Value Ref Range Status   Specimen Description   Final    URINE, CATHETERIZED Performed at [facility], [address] [place], [place] [address]    Special Requests   Final    NONE Reflexed from [phone] [id number] Performed at [facility], [address], [place], [place] [address]    Culture >=100,000 COLONIES/mL KLEBSIELLA PNEUMONIAE (A)  Final   Report Status 08/13/2023 FINAL  Final   Organism ID, Bacteria KLEBSIELLA PNEUMONIAE (A)  Final      Susceptibility   Klebsiella pneumoniae - MIC*    AMPICILLIN RESISTANT Resistant     CEFAZOLIN  <=4 SENSITIVE Sensitive     CEFEPIME <=0.12 SENSITIVE Sensitive     CEFTRIAXONE  <=0.25 SENSITIVE Sensitive     CIPROFLOXACIN  <=0.25 SENSITIVE Sensitive     GENTAMICIN  <=1 SENSITIVE Sensitive     IMIPENEM <=0.25 SENSITIVE Sensitive     NITROFURANTOIN  256 RESISTANT Resistant     TRIMETH/SULFA <=20 SENSITIVE Sensitive     AMPICILLIN/SULBACTAM <=2 SENSITIVE Sensitive     PIP/TAZO <=4 SENSITIVE Sensitive ug/mL    * >=100,000 COLONIES/mL KLEBSIELLA PNEUMONIAE  Blood Culture (routine x 2)     Status: None (Preliminary result)   Collection Time: 08/11/23  7:00 AM   Specimen: BLOOD  Result Value Ref  Range Status   Specimen Description BLOOD BLOOD RIGHT ARM  Final   Special Requests   Final    BOTTLES DRAWN AEROBIC AND ANAEROBIC Blood Culture adequate volume   Culture   Final    NO GROWTH 3 DAYS Performed at [facility], [address] [place], [place] [address]    Report Status PENDING  Incomplete  Blood Culture (routine x 2)     Status: None (Preliminary result)   Collection Time: 08/11/23  7:08 AM   Specimen: BLOOD  Result Value Ref Range Status   Specimen Description BLOOD BLOOD RIGHT ARM  Final   Special Requests   Final    BOTTLES DRAWN AEROBIC AND ANAEROBIC Blood Culture adequate volume   Culture   Final    NO GROWTH 3 DAYS Performed at [facility], [address], [place], [place] [address]    Report Status PENDING  Incomplete    Labs: CBC: Recent Labs  Lab 08/11/23 0700 08/12/23 0407 08/13/23 0351 08/14/23 0447  WBC 9.1 12.2* 9.1 5.1  NEUTROABS 7.9*  --   --   --   HGB 9.0* 9.1* 7.7* 8.8*  HCT  30.6* 30.6* 25.9* 29.1*  MCV 83.8 84.1 82.0 81.7  PLT 247 226 201 224   Basic Metabolic Panel: Recent Labs  Lab 08/11/23 0700 08/12/23 0407 08/13/23 0351 08/14/23 0447  NA 138 137 137 136  K 4.4 3.6 3.3* 3.3*  CL 106 105 107 106  CO2 21* 21* 22 22  GLUCOSE 125* 103* 98 95  BUN 16 15 11 10   CREATININE 0.95 0.82 0.72 0.69  CALCIUM 9.0 8.5* 7.9* 7.8*  MG  --   --  1.7 2.3   Liver Function Tests: Recent Labs  Lab 08/11/23 0700  AST 16  ALT 10  ALKPHOS 72  BILITOT 0.3  PROT 6.5  ALBUMIN 3.2*   CBG: No results for input(s): GLUCAP in the last 168 hours.  Discharge time spent: greater than 30 minutes.  Signed: Alm Schneider, MD [facility] 08/14/2023

## 2023-08-13 NOTE — Progress Notes (Signed)
 PROGRESS NOTE  Kim Krueger FMW:[id number] DOB: 30-Dec-1940 DOA: 08/11/2023 PCP: Vernadine Charlie ORN, MD  Brief History:  83 year old female with a history of mild neurocognitive disorder, hypothyroidism, temporal arteritis, left breast cancer, overactive bladder with frequent UTIs, vitamin D deficiency, and hyperlipidemia presenting with confusion and altered mental status.  The patient was in her usual state of health when she went to bed on the evening of 08/10/2023.  She woke up at 4 AM on 08/11/2023 confused and hallucinating.  She has generalized weakness to the point where she was unable to get out of bed without assistance.  At baseline, the patient is able to ambulate with a cane and perform her own ADLs.  The patient also felt hot.  Her spouse took her temperature and it was 59 F.  EMS was activated.  The patient has not been start any new medications.  There is not been any recent fevers, headache, chest pain, shortness breath, cough, hemoptysis, nausea, vomiting, diarrhea, abdominal pain.  Notably, the patient has been complaining some dysuria and suprapubic pain in the early morning 08/11/2023.  There is no hematuria, hematochezia, melena.  She has been taking nitrofurantoin  prophylaxis for UTIs for the last 3 months. In the ED, the patient febrile up to 1 and 3.4 F.  She was tachycardic 110s and hemodynamically stable.  Oxygen saturation was 95% on room air.  WBC 9.1, hemoglobin 9.0, platelet 247.  Sodium 138, potassium 4.4, bicarbonate 21, serum creatinine 0.95.  LFTs were unremarkable.  Lactic acid 1.6>> 1.5.  UA showed >50 WBC, >50 RBC.  Chest x-ray was negative for any consolidations.  EKG showed sinus tachycardia with nonspecific ST changes.  Blood cultures and urine cultures were obtained.  The patient was started on ceftriaxone .  She was admitted for further evaluation and treatment of sepsis.   Assessment/Plan:  Sepsis -presented with fever, tachycardia and AMS -due  to urinary source -lactic 1.6>>1.5 -continue IVF -broaden abx coverage to vanc/merrem  due to persistent fever -7/26 CT abd/pelvis--Abnormal wall thickening in the right distal ureter and in the urinary bladder especially eccentric to the right in the urinary bladder. Patchy regions of hypoenhancement in the right kidney without rim sign, compatible with pyelonephritis -follow blood cultures--neg to date -follow urine culture--Klebsiella -still having fevers but slowly improving   UTI--Klebsiella PNA -UA> 50 WBC -d/c vanc and merrem  -start cefazolin    Hypothyroidism -continue synthroid    Minor Neurocognitive Disorder -continue namenda    Left Breast Cancer -continue anastrazole -02/28/2018 left lumpectomy -follow up Dr. Odean                     Family Communication:   Family at bedside 7/27   Consultants:  none   Code Status:  FULL   DVT Prophylaxis:  [place] Lovenox      Procedures: As Listed in Progress Note Above   Antibiotics: Vanc 7/24>>7/27 Merrem  7/24>>7/27 Cefazolin  7/27          Subjective: Complains of dysuria.  Denies f/c, cp, sob, n/v/d, abd pain  Objective: Vitals:   08/12/23 2155 08/13/23 0000 08/13/23 0200 08/13/23 0538  BP: (!) 189/77  (!) 154/80 (!) 188/85  Pulse: 97  82 88  Resp: 20   20  Temp: (!) 103.2 F (39.6 C) 100.3 F (37.9 C) 99 F (37.2 C) 98.4 F (36.9 C)  TempSrc: Oral  Oral Oral  SpO2: 97%  99% 97%  Weight:  Height:        Intake/Output Summary (Last 24 hours) at 08/13/2023 1238 Last data filed at 08/13/2023 0842 Gross per 24 hour  Intake 2388.46 ml  Output 300 ml  Net 2088.46 ml   Weight change:  Exam:  General:  Pt is alert, follows commands appropriately, not in acute distress HEENT: No icterus, No thrush, No neck mass, [place]/AT Cardiovascular: RRR, S1/S2, no rubs, no gallops Respiratory: CTA bilaterally, no wheezing, no crackles, no rhonchi Abdomen: Soft/+BS, non tender, non distended, no  guarding Extremities: No edema, No lymphangitis, No petechiae, No rashes, no synovitis   Data Reviewed: I have personally reviewed following labs and imaging studies Basic Metabolic Panel: Recent Labs  Lab 08/11/23 0700 08/12/23 0407 08/13/23 0351  NA 138 137 137  K 4.4 3.6 3.3*  CL 106 105 107  CO2 21* 21* 22  GLUCOSE 125* 103* 98  BUN 16 15 11   CREATININE 0.95 0.82 0.72  CALCIUM 9.0 8.5* 7.9*  MG  --   --  1.7   Liver Function Tests: Recent Labs  Lab 08/11/23 0700  AST 16  ALT 10  ALKPHOS 72  BILITOT 0.3  PROT 6.5  ALBUMIN 3.2*   No results for input(s): LIPASE, AMYLASE in the last 168 hours. No results for input(s): AMMONIA in the last 168 hours. Coagulation Profile: Recent Labs  Lab 08/11/23 0700  INR 1.0   CBC: Recent Labs  Lab 08/11/23 0700 08/12/23 0407 08/13/23 0351  WBC 9.1 12.2* 9.1  NEUTROABS 7.9*  --   --   HGB 9.0* 9.1* 7.7*  HCT 30.6* 30.6* 25.9*  MCV 83.8 84.1 82.0  PLT 247 226 201   Cardiac Enzymes: No results for input(s): CKTOTAL, CKMB, CKMBINDEX, TROPONINI in the last 168 hours. BNP: Invalid input(s): POCBNP CBG: No results for input(s): GLUCAP in the last 168 hours. HbA1C: No results for input(s): HGBA1C in the last 72 hours. Urine analysis:    Component Value Date/Time   COLORURINE YELLOW 08/11/2023 0654   APPEARANCEUR HAZY (A) 08/11/2023 0654   APPEARANCEUR Cloudy (A) 03/20/2023 1509   LABSPEC 1.014 08/11/2023 0654   PHURINE 6.0 08/11/2023 0654   GLUCOSEU NEGATIVE 08/11/2023 0654   HGBUR LARGE (A) 08/11/2023 0654   BILIRUBINUR NEGATIVE 08/11/2023 0654   BILIRUBINUR Negative 03/20/2023 1509   KETONESUR NEGATIVE 08/11/2023 0654   PROTEINUR 100 (A) 08/11/2023 0654   NITRITE NEGATIVE 08/11/2023 0654   LEUKOCYTESUR LARGE (A) 08/11/2023 0654   Sepsis Labs: @LABRCNTIP (procalcitonin:4,lacticidven:4) ) Recent Results (from the past 240 hours)  Urine Culture     Status: Abnormal   Collection Time:  08/11/23  6:54 AM   Specimen: Urine, Catheterized  Result Value Ref Range Status   Specimen Description   Final    URINE, CATHETERIZED Performed at [facility], [address], [place], [place] [address]    Special Requests   Final    NONE Reflexed from [id number] Performed at [facility], [address] [place], [place] [address]    Culture >=100,000 COLONIES/mL KLEBSIELLA PNEUMONIAE (A)  Final   Report Status 08/13/2023 FINAL  Final   Organism ID, Bacteria KLEBSIELLA PNEUMONIAE (A)  Final      Susceptibility   Klebsiella pneumoniae - MIC*    AMPICILLIN RESISTANT Resistant     CEFAZOLIN  <=4 SENSITIVE Sensitive     CEFEPIME <=0.12 SENSITIVE Sensitive     CEFTRIAXONE  <=0.25 SENSITIVE Sensitive     CIPROFLOXACIN  <=0.25 SENSITIVE Sensitive     GENTAMICIN  <=1 SENSITIVE  Sensitive     IMIPENEM <=0.25 SENSITIVE Sensitive     NITROFURANTOIN  256 RESISTANT Resistant     TRIMETH/SULFA <=20 SENSITIVE Sensitive     AMPICILLIN/SULBACTAM <=2 SENSITIVE Sensitive     PIP/TAZO <=4 SENSITIVE Sensitive ug/mL    * >=100,000 COLONIES/mL KLEBSIELLA PNEUMONIAE  Blood Culture (routine x 2)     Status: None (Preliminary result)   Collection Time: 08/11/23  7:00 AM   Specimen: BLOOD  Result Value Ref Range Status   Specimen Description BLOOD BLOOD RIGHT ARM  Final   Special Requests   Final    BOTTLES DRAWN AEROBIC AND ANAEROBIC Blood Culture adequate volume   Culture   Final    NO GROWTH 2 DAYS Performed at [facility], [address] [place], [place] [address]    Report Status PENDING  Incomplete  Blood Culture (routine x 2)     Status: None (Preliminary result)   Collection Time: 08/11/23  7:08 AM   Specimen: BLOOD  Result Value Ref Range Status   Specimen Description BLOOD BLOOD RIGHT ARM  Final   Special Requests   Final    BOTTLES DRAWN AEROBIC AND ANAEROBIC Blood Culture adequate volume   Culture   Final    NO GROWTH 2 DAYS Performed at [facility], [address], [place],  [place] [address]    Report Status PENDING  Incomplete     Scheduled Meds:  acetaminophen   1,000 mg Oral Q8H   amitriptyline   10 mg Oral QHS   anastrozole   1 mg Oral Daily   enoxaparin  (LOVENOX ) injection  40 mg Subcutaneous Daily   levothyroxine   88 mcg Oral QAC breakfast   memantine   10 mg Oral BID   potassium chloride   40 mEq Oral Once   Continuous Infusions:   ceFAZolin  (ANCEF ) IV     magnesium  sulfate bolus IVPB      Procedures/Studies: CT ABDOMEN PELVIS W CONTRAST Result Date: 08/12/2023 CLINICAL DATA:  Sepsis. Suprapubic pain. Urinary tract infection. Fever and leukocytosis. Tachycardia. Altered mental status. EXAM: CT ABDOMEN AND PELVIS WITH CONTRAST TECHNIQUE: Multidetector CT imaging of the abdomen and pelvis was performed using the standard protocol following bolus administration of intravenous contrast. RADIATION DOSE REDUCTION: This exam was performed according to the departmental dose-optimization program which includes automated exposure control, adjustment of the mA and/or kV according to patient size and/or use of iterative reconstruction technique. CONTRAST:  OMNIPAQUE  IOHEXOL  300 MG/ML  SOLN COMPARISON:  03/09/2016 FINDINGS: Lower chest: Large hiatal hernia containing most of the stomach. Small bilateral pleural effusions with passive atelectasis. Mild mitral valve calcification. Descending thoracic aortic atherosclerosis. Hepatobiliary: Unremarkable Pancreas: Unremarkable Spleen: Unremarkable Adrenals/Urinary Tract: Abnormal wall thickening in the right distal ureter and in the urinary bladder especially eccentric to the right in the urinary bladder. Patchy regions of hypoenhancement in the right kidney without rim sign, suspicious for pyelonephritis. Scarring in the left kidney upper pole. Stomach/Bowel: Most of the stomach is intrathoracic. Orally administered contrast medium extends through to the rectum. Distal colonic air-fluid levels raise the possibility of diarrheal  process. There is very little in the way of colonic stool. Normal appendix. Vascular/Lymphatic: Atherosclerosis is present, including aortoiliac atherosclerotic disease. Reproductive: Uterus absent.  Adnexa unremarkable. Other: Small amount of free pelvic fluid eccentric to the right on image 67 series 2. Musculoskeletal: Lower thoracic spondylosis. 8 mm degenerative anterolisthesis at L4-5, similar to prior. Umbilical hernia contains adipose tissue. IMPRESSION: 1. Abnormal wall thickening in the right distal ureter and in the  urinary bladder especially eccentric to the right in the urinary bladder. Patchy regions of hypoenhancement in the right kidney without rim sign, compatible with pyelonephritis. 2. Small amount of free pelvic fluid eccentric to the right. 3. Distal colonic air-fluid levels raise the possibility of diarrheal process. There is very little in the way of colonic stool. 4. Large hiatal hernia containing most of the stomach. 5. Small bilateral pleural effusions with passive atelectasis. 6. Umbilical hernia contains adipose tissue. 7. 8 mm degenerative anterolisthesis at L4-5, similar to prior. 8.  Aortic Atherosclerosis (ICD10-I70.0). Electronically Signed   By: Ryan Salvage M.D.   On: 08/12/2023 16:44   DG Chest Port 1 View Result Date: 08/11/2023 CLINICAL DATA:  Dysuria.  Altered mental status. EXAM: PORTABLE CHEST 1 VIEW COMPARISON:  Chest radiograph dated 07/13/2021 FINDINGS: Normal lung volumes. Large hiatal hernia. No focal consolidations. No pleural effusion or pneumothorax. Similar mildly enlarged cardiomediastinal silhouette. No acute osseous abnormality. IMPRESSION: 1.  No focal consolidations. 2. Large hiatal hernia. Electronically Signed   By: Limin  Xu M.D.   On: 08/11/2023 07:16    Alm Schneider, DO  [facility]  If 7PM-7AM, please contact night-coverage [email or web address] Password [facility] 08/13/2023, 12:38 PM   LOS: 2 days

## 2023-08-14 DIAGNOSIS — A4159 Other Gram-negative sepsis: Secondary | ICD-10-CM

## 2023-08-14 DIAGNOSIS — N10 Acute tubulo-interstitial nephritis: Secondary | ICD-10-CM

## 2023-08-14 LAB — BASIC METABOLIC PANEL WITH GFR
Anion gap: 8 (ref 5–15)
BUN: 10 mg/dL (ref 8–23)
CO2: 22 mmol/L (ref 22–32)
Calcium: 7.8 mg/dL — ABNORMAL LOW (ref 8.9–10.3)
Chloride: 106 mmol/L (ref 98–111)
Creatinine, Ser: 0.69 mg/dL (ref 0.44–1.00)
GFR, Estimated: >60 mL/min (ref >60)
Glucose, Bld: 95 mg/dL (ref 70–99)
Potassium: 3.3 mmol/L — ABNORMAL LOW (ref 3.5–5.1)
Sodium: 136 mmol/L (ref 135–145)

## 2023-08-14 LAB — CBC
HCT: 29.1 % — ABNORMAL LOW (ref 36.0–46.0)
Hemoglobin: 8.8 g/dL — ABNORMAL LOW (ref 12.0–15.0)
MCH: 24.7 pg — ABNORMAL LOW (ref 26.0–34.0)
MCHC: 30.2 g/dL (ref 30.0–36.0)
MCV: 81.7 fL (ref 80.0–100.0)
Platelets: 224 K/uL (ref 150–400)
RBC: 3.56 MIL/uL — ABNORMAL LOW (ref 3.87–5.11)
RDW: 15.9 % — ABNORMAL HIGH (ref 11.5–15.5)
WBC: 5.1 K/uL (ref 4.0–10.5)
nRBC: 0 % (ref 0.0–0.2)

## 2023-08-14 LAB — MAGNESIUM: Magnesium: 2.3 mg/dL (ref 1.7–2.4)

## 2023-08-14 MED ORDER — CEFADROXIL 500 MG PO CAPS: 1000.0000 mg | ORAL_CAPSULE | Freq: Two times a day (BID) | ORAL | 0 refills | Status: AC

## 2023-08-14 MED ORDER — CEFADROXIL 500 MG PO CAPS
1000.0000 mg | ORAL_CAPSULE | Freq: Two times a day (BID) | ORAL | Status: DC
  Administered 2023-08-14: 1000 mg via ORAL
  Filled 2023-08-14 (×3): qty 2

## 2023-08-14 NOTE — Care Management Important Message (Signed)
 Important Message  Patient Details  Name: Kim Krueger MRN: [id number] Date of Birth: [birth year]   Important Message Given:  Yes - Medicare IM     Jamiah Homeyer L Sharrie Self 08/14/2023, 10:05 AM

## 2023-08-14 NOTE — Plan of Care (Signed)
  Problem: Health Behavior/Discharge Planning: Goal: Ability to manage health-related needs will improve Outcome: Progressing   Problem: Clinical Measurements: Goal: Will remain free from infection Outcome: Progressing   Problem: Activity: Goal: Risk for activity intolerance will decrease Outcome: Progressing   Problem: Nutrition: Goal: Adequate nutrition will be maintained Outcome: Progressing   Problem: Elimination: Goal: Will not experience complications related to urinary retention Outcome: Progressing   Problem: Pain Managment: Goal: General experience of comfort will improve and/or be controlled Outcome: Progressing   Problem: Skin Integrity: Goal: Risk for impaired skin integrity will decrease Outcome: Progressing

## 2023-08-16 LAB — CULTURE, BLOOD (ROUTINE X 2)
Culture: NO GROWTH
Culture: NO GROWTH
Special Requests: ADEQUATE
Special Requests: ADEQUATE

## 2023-08-24 ENCOUNTER — Ambulatory Visit: Admitting: Physician Assistant

## 2023-09-08 ENCOUNTER — Encounter: Payer: Self-pay | Admitting: Internal Medicine

## 2023-10-23 ENCOUNTER — Other Ambulatory Visit (HOSPITAL_COMMUNITY): Payer: Self-pay | Admitting: Hematology and Oncology

## 2023-10-23 ENCOUNTER — Encounter: Payer: Self-pay | Admitting: Hematology and Oncology

## 2023-10-23 DIAGNOSIS — Z1231 Encounter for screening mammogram for malignant neoplasm of breast: Secondary | ICD-10-CM

## 2023-11-15 ENCOUNTER — Ambulatory Visit (HOSPITAL_COMMUNITY)
Admission: RE | Admit: 2023-11-15 | Discharge: 2023-11-15 | Disposition: A | Discharge status: Home / Self Care | Source: Ambulatory Visit | Attending: Hematology and Oncology | Admitting: Hematology and Oncology

## 2023-11-15 DIAGNOSIS — Z780 Asymptomatic menopausal state: Secondary | ICD-10-CM | POA: Diagnosis present

## 2023-11-15 DIAGNOSIS — Z1231 Encounter for screening mammogram for malignant neoplasm of breast: Secondary | ICD-10-CM | POA: Insufficient documentation

## 2023-11-16 ENCOUNTER — Ambulatory Visit: Payer: Medicare Other | Admitting: Hematology and Oncology

## 2024-01-01 ENCOUNTER — Telehealth: Payer: Self-pay

## 2024-01-01 NOTE — Telephone Encounter (Signed)
°  Pt state's she is not having any UTI symptoms and want to wait to until after christmas to be seen. Pt is made aware she will need a F/U appointment for medication refill. Pt state's she is seeing a urologist at [facility].. Pt state's her Rx was written by [facility] and she has refills. Pt is advised to contact pharmacy for refills. Voiced understanding.

## 2024-01-04 NOTE — Progress Notes (Signed)
 Kim Krueger is a very pleasant 83 y.o. 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 Patient is currently on memantine  10 mg twice daily, tolerating well***.  This patient is accompanied in the office by her husband*** who supplements the history.  Previous records as well as any outside records available were reviewed prior to todays visit. Patient was last seen on 07/10/2023***. Memory is **. MMSE today is  /30.  She may be advancing to mild dementia.***Patient is able to participate on ADLs and continues to drive without difficulties. Mood is***   Discussed the use of AI scribe software for clinical note transcription with the patient, who gave verbal consent to proceed.  Follow-up in 6 months Repeat neuropsych evaluation in 6 months for diagnostic clarity and disease trajectory Continue memantine  10 mg twice daily, side effects discussed Recommend good control of cardiovascular risk factors Continue to control mood as per PCP  History of Present Illness    Any changes in memory since last visit?   May be worse especially when tired.  She has some difficulty with short-term memory including recent conversations and new information, names of people.  She tries to read , watching the news on her phone.  She does not do as many crossword puzzles as before. repeats oneself?  Endorsed by her husband Disoriented when walking into a room? Denies    Leaving objects?  May misplace things but not in unusual places   Wandering behavior?  denies   Any personality changes since last visit?  Denies.   Any worsening depression?:  Denies.   Hallucinations or paranoia?  On August 11, 2023 the patient had an episode of confusion after  waking up at 4 AM with generalized weakness, needing assistance to get out of the bed with a fever of 103 and tachycardia.  EMS was called and she was admitted to the hospital with Klebsiella UTI and PNA sepsis.  She was treated with IV antibiotics and discharged to home on antibiotics 08/14/2023.   Seizures? denies    Any sleep changes?  Overall sleeps well. Denies vivid dreams, REM behavior or sleepwalking   Sleep apnea?   Denies.   Any hygiene concerns? Denies.  Independent of bathing and dressing?  Endorsed  Does the patient needs help with medications?  Patient is in husband charge   Who is in charge of the finances?  Husband is in charge     Any changes in appetite?  Denies.      Patient have trouble swallowing? Denies.   Does the patient cook? No Any headaches?   denies   Any vision changes? She has cataracts  Chronic pain?  Yes due to knee arthritis Ambulates with difficulty?  She has bilateral knee OA, limiting her mobility, uses a cane for stability  Recent falls or head injuries? Denies.     Unilateral weakness, numbness or tingling? denies   Any tremors?  Denies    Any anosmia?  Denies   Any incontinence of urine?  Endorsed, history of frequent UTIs, with recent Klebsiella sepsis as above.   Any bowel dysfunction?   Denies      Patient lives with her husband     Does the patient drive?  Endorsed, short and long  distances.   Initial visit 07/22/2021    How long did patient have memory difficulties?  She reports that her memory worsened  over the last  4-6  months. Took a medicine when I was in the hospital  that may have had something to do with it Patient lives with: Spouse who reports that her memory is worse over the last 6 months  couldn't remember the date and time . Daughter reports that her memory deficits arelonger than that,maybe 9 mo to  year,  writing things all the time such as conversations and appointments because she otherwise could not remember  repeats  oneself? Endorsed Disoriented when walking into a room?  Patient denies   Leaving objects in unusual places?  Patient denies  but  husband  reports that she places her purse anywhere.  The other day she cod not remember where she left her phone we moved the house upside down and then she realized the phone was in her pocket   Ambulates  with difficulty?   Patient denies   Recent falls?  Endorsed  I have some knee issues so  I use a walker when needed  Any head injuries?  Patient denies   History of seizures?   Patient denies   Wandering behavior?  Patient denies   Patient drives?   Patient drives very short distances  to the bank, beauty salon, grocery and drugstore Any mood changes such irritability agitation? She is a lot nicer than she was- daughter says  Any history of depression?:  Patient denies   Hallucinations?  Patient denies at this moment, only when I was in the hospital   Paranoia?  Patient denies   Patient reports that he sleeps well without vivid dreams, REM behavior or sleepwalking   History of sleep apnea?  Patient denies   Any hygiene concerns?  Patient denies   Independent of bathing and dressing?  Endorsed  Does the patient needs help with medications?  Husband in charge Who is in charge of the finances? Husband is in charge    Any changes in appetite?  Patient denies, but  she doesn't drink enough water   Patient have trouble swallowing? Patient denies   Does the patient cook?  Patient denies   Any kitchen accidents such as leaving the stove on? Husband does the cooking  Any headaches?  Patient denies  TA  Double vision? Patient denies   Any focal numbness or tingling?  Patient denies   Chronic back pain Patient denies   Unilateral weakness?  Patient denies   Any tremors?  Patient denies   Any history of anosmia?  Patient denies   Any incontinence of urine?  Incontinence wears pads. Frequent UTIs Any bowel dysfunction?   Patient denies    History of  heavy alcohol intake?  Patient denies   History of heavy tobacco use?  Patient denies   Family history of dementia?   Maternal Grandmother, maternal uncle and maternal niece  with Alzheimer's disease   Retired environmental health practitioner   Neuropsych evaluation 06/01/2023 Briefly, results suggested severe impairment surrounding delayed retrieval aspects of memory. An isolated impairment was also exhibited across a line orientation task, while some variability was exhibited across response inhibition. However, other performances assessing visuospatial abilities and executive functioning respectively were appropriate. Variability was also exhibited across recognition/consolidation aspects of memory. Relative to her previous evaluation in March 2024, Ms. Kukuk exhibited a good degree of stability. Some intra-domain variability was exhibited across visuospatial abilities (3/4  tasks saw mild decline where another task exhibited some mild improvement). However, outside of this, no other assessed cognitive domain exhibited clinically significant change. The etiology for ongoing significant memory impairment remains uncertain. An underlying neurodegenerative illness such as Alzheimer's disease remains plausible. Across memory testing, Ms. Wigal was able to learn novel information reasonably well. After a brief delay, she was fully amnestic (i.e., 0% retention) across all three memory tasks, suggesting prominent concern for rapid forgetting which is a hallmark testing characteristic of Alzheimer's disease. However, she did demonstrate some benefit from cueing and other non-memory areas commonly implicated in this illness (i.e., semantic fluency, confrontation naming) were both stable and normatively appropriate. If this illness is indeed present and driving significant memory dysfunction, it would appear to still be in earlier stages with minimal progression relative to her previous evaluation.       Initial  visit 07/22/21  How long did patient have memory difficulties?  She reports that her memory worsened  over the last  4-6  months. Took a medicine when I was in the hospital  that may have had something to do with it Patient lives with: Spouse who reports that her memory is worse over the last 6 months  couldn't remember the date and time . Daughter reports that her memory deficits arelonger than that,maybe 9 mo to  year,  writing things all the time such as conversations and appointments because she otherwise could not remember  repeats oneself? Endorsed Disoriented when walking into a room?  Patient denies   Leaving objects in unusual places?  Patient denies  but  husband  reports that she places her purse anywhere.  The other day she cod not remember where she left her phone we moved the house upside down and then she realized the phone was in her pocket   Ambulates  with difficulty?   Patient denies   Recent falls?  Endorsed  I have some knee issues so  I use a walker when needed  Any head injuries?  Patient denies   History of seizures?   Patient denies   Wandering behavior?  Patient denies   Patient drives?   Patient drives very short distances  to the bank, beauty salon, grocery and drugstore Any mood changes such irritability agitation? She is a lot nicer than she was- daughter says  Any history of depression?:  Patient denies   Hallucinations?  Patient denies at this moment, only when I was in the hospital   Paranoia?  Patient denies   Patient reports that he sleeps well without vivid dreams, REM behavior or sleepwalking   History of sleep apnea?  Patient denies   Any hygiene concerns?  Patient denies   Independent of bathing and dressing?  Endorsed  Does the patient needs help with medications?  Husband in charge Who is in charge of the finances? Husband is in charge    Any changes in appetite?  Patient denies, but  she doesn't drink enough water   Patient have trouble  swallowing? Patient denies   Does the patient cook?  Patient denies   Any kitchen accidents such as leaving the stove on? Husband does the cooking  Any headaches?  Patient denies  TA  Double vision? Patient denies   Any focal numbness or tingling?  Patient denies   Chronic back pain Patient denies   Unilateral weakness?  Patient denies   Any tremors?  Patient denies   Any history of anosmia?  Patient denies  Any incontinence of urine?  Incontinence wears pads. Frequent UTIs Any bowel dysfunction?   Patient denies    History of heavy alcohol intake?  Patient denies   History of heavy tobacco use?  Patient denies   Family history of dementia?   Maternal Grandmother, maternal uncle and maternal niece  with Alzheimer's disease   Retired environmental health practitioner             07/10/2023    5:00 PM 01/25/2023   11:00 AM 07/25/2022   12:00 PM  MMSE - Mini Mental State Exam  Orientation to time 3 5 4   Orientation to Place 5 5 5   Registration 3 3 3   Attention/ Calculation 5 5 5   Recall 2 3 2   Language- name 2 objects 2 2 2   Language- repeat 1 1 1   Language- follow 3 step command 3 3 3   Language- read & follow direction 1 1 1   Write a sentence 1 1 1   Copy design 0 1 1  Total score 26 30 28       07/24/2021    3:00 PM  Montreal Cognitive Assessment   Visuospatial/ Executive (0/5) 0  Naming (0/3) 3  Attention: Read list of digits (0/2) 2  Attention: Read list of letters (0/1) 1  Attention: Serial 7 subtraction starting at 100 (0/3) 2  Language: Repeat phrase (0/2) 2  Language : Fluency (0/1) 0  Abstraction (0/2) 2  Delayed Recall (0/5) 0  Orientation (0/6) 5  Total 17  Adjusted Score (based on education) 17      Objective:    Neurological Exam:    VITALS:  There were no vitals filed for this visit.  GEN:  The patient appears stated age and is in NAD. HEENT:  Normocephalic, atraumatic.   Neurological examination:  General: NAD, well-groomed, appears stated  age. Orientation: The patient is alert. Oriented to person, place and not to date Cranial nerves: There is good facial symmetry.The speech is fluent and clear. No aphasia or dysarthria. Fund of knowledge is appropriate. Recent and remote memory are impaired. Attention and concentration are reduced. Able to name objects and repeat phrases.  Hearing is intact to conversational tone. *** Sensation: Sensation is intact to light touch throughout Motor: Strength is at least antigravity x4. DTR's 2/4 in UE/LE     Movement examination:  Tone: There is normal tone in the UE/LE Abnormal movements:  no tremor.  No myoclonus.  No asterixis.   Coordination:  There is no decremation with RAM's. Normal finger to nose  Gait and Station: The patient has no*** difficulty arising out of a deep-seated chair without the use of the hands. The patient's stride length is good.  Gait is cautious and narrow.  Flexes forward   Thank you for allowing us  the opportunity to participate in the care of this nice patient. Please do not hesitate to contact us  for any questions or concerns.   Total time spent on today's visit was *** minutes dedicated to this patient today, preparing to see patient, examining the patient, ordering tests and/or medications and counseling the patient, documenting clinical information in the EHR or other health record, independently interpreting results and communicating results to the patient/family, discussing treatment and goals, answering patient's questions and coordinating care.  Cc:  Tisovec, Charlie ORN, MD  Camie Sevin 01/04/2024 6:13 AM

## 2024-01-05 ENCOUNTER — Ambulatory Visit: Admitting: Physician Assistant

## 2024-01-05 ENCOUNTER — Encounter: Payer: Self-pay | Admitting: Physician Assistant

## 2024-01-05 VITALS — BP 110/60 | HR 96 | Resp 20 | Ht 63.5 in | Wt 161.0 lb

## 2024-01-05 DIAGNOSIS — R413 Other amnesia: Secondary | ICD-10-CM

## 2024-01-05 DIAGNOSIS — G3184 Mild cognitive impairment, so stated: Secondary | ICD-10-CM

## 2024-01-05 NOTE — Patient Instructions (Signed)
 .It was a pleasure to see you today at our office.   Recommendations:   Follow up in 6  months, after you had the neurocognitive testing . Memantine10 mg twice daily, side effects discussed Repeat neurocognitive testing     Whom to call:  Memory  decline, memory medications: Call our office [phone]   For psychiatric meds, mood meds: Please have your primary care physician manage these medications.   For assessment of decision of mental capacity and competency:  Call Dr. Rosaline Nine, geriatric psychiatrist at [phone]  For guidance in geriatric dementia issues please call [facility] [phone]  For guidance regarding [facility] and if placement were needed at the facility, contact Nat Hock, Social Worker tel: [phone]  If you have any severe symptoms of a stroke, or other severe issues such as confusion,severe chills or fever, etc call 911 or go to the ER as you may need to be evaluated further   Feel free to visit Facebook page  [employer] for tips of how to care for people with memory problems.      RECOMMENDATIONS FOR ALL PATIENTS WITH MEMORY PROBLEMS: 1. Continue to exercise (Recommend 30 minutes of walking everyday, or 3 hours every week) 2. Increase social interactions - continue going to [place] and enjoy social gatherings with friends and family 3. Eat healthy, avoid fried foods and eat more fruits and vegetables 4. Maintain adequate blood pressure, blood sugar, and blood cholesterol level. Reducing the risk of stroke and cardiovascular disease also helps promoting better memory. 5. Avoid stressful situations. Live a simple life and avoid aggravations. Organize your time and prepare for the next day in anticipation. 6. Sleep well, avoid any interruptions of sleep and avoid any distractions in the bedroom that may interfere with adequate sleep quality 7. Avoid sugar, avoid sweets as there is a strong link between excessive  sugar intake, diabetes, and cognitive impairment We discussed the Mediterranean diet, which has been shown to help patients reduce the risk of progressive memory disorders and reduces cardiovascular risk. This includes eating fish, eat fruits and green leafy vegetables, nuts like almonds and hazelnuts, walnuts, and also use olive oil. Avoid fast foods and fried foods as much as possible. Avoid sweets and sugar as sugar use has been linked to worsening of memory function.  There is always a concern of gradual progression of memory problems. If this is the case, then we may need to adjust level of care according to patient needs. Support, both to the patient and caregiver, should then be put into place.    FALL PRECAUTIONS: Be cautious when walking. Scan the area for obstacles that may increase the risk of trips and falls. When getting up in the mornings, sit up at the edge of the bed for a few minutes before getting out of bed. Consider elevating the bed at the head end to avoid drop of blood pressure when getting up. Walk always in a well-lit room (use night lights in the walls). Avoid area rugs or power cords from appliances in the middle of the walkways. Use a walker or a cane if necessary and consider physical therapy for balance exercise. Get your eyesight checked regularly.  FINANCIAL OVERSIGHT: Supervision, especially oversight when making financial decisions or transactions is also recommended.  HOME SAFETY: Consider the safety of the kitchen when operating appliances like stoves, microwave oven, and blender. Consider having supervision and share cooking responsibilities until no longer able to participate in those. Accidents  with firearms and other hazards in the house should be identified and addressed as well.   ABILITY TO BE LEFT ALONE: If patient is unable to contact 911 operator, consider using [employer], or when the need is there, arrange for someone to stay with patients. Smoking is a fire  hazard, consider supervision or cessation. Risk of wandering should be assessed by caregiver and if detected at any point, supervision and safe proof recommendations should be instituted.  MEDICATION SUPERVISION: Inability to self-administer medication needs to be constantly addressed. Implement a mechanism to ensure safe administration of the medications.   DRIVING: Regarding driving, in patients with progressive memory problems, driving will be impaired. We advise to have someone else do the driving if trouble finding directions or if minor accidents are reported. Independent driving assessment is available to determine safety of driving.   If you are interested in the driving assessment, you can contact the following:  [employer] in [place] [phone]  [facility] [phone]  [facility] [phone]  [facility] [phone] or [phone]

## 2024-04-01 ENCOUNTER — Ambulatory Visit: Admitting: Hematology and Oncology

## 2024-07-02 ENCOUNTER — Ambulatory Visit: Payer: Self-pay

## 2024-07-02 ENCOUNTER — Institutional Professional Consult (permissible substitution): Payer: Self-pay | Admitting: Psychology

## 2024-07-09 ENCOUNTER — Encounter: Payer: Self-pay | Admitting: Psychology

## 2024-08-05 ENCOUNTER — Ambulatory Visit: Payer: Self-pay | Admitting: Physician Assistant
